# Patient Record
Sex: Male | Born: 1970
Health system: Southern US, Community
[De-identification: ages and names within clinical notes are randomized; demographics above are authoritative.]

## PROBLEM LIST (undated history)

## (undated) DIAGNOSIS — R142 Eructation: Secondary | ICD-10-CM

## (undated) DIAGNOSIS — G629 Polyneuropathy, unspecified: Secondary | ICD-10-CM

## (undated) DIAGNOSIS — R197 Diarrhea, unspecified: Secondary | ICD-10-CM

## (undated) DIAGNOSIS — F329 Major depressive disorder, single episode, unspecified: Secondary | ICD-10-CM

## (undated) DIAGNOSIS — E7800 Pure hypercholesterolemia, unspecified: Secondary | ICD-10-CM

## (undated) DIAGNOSIS — K811 Chronic cholecystitis: Secondary | ICD-10-CM

## (undated) DIAGNOSIS — Z21 Asymptomatic human immunodeficiency virus [HIV] infection status: Secondary | ICD-10-CM

## (undated) DIAGNOSIS — B1910 Unspecified viral hepatitis B without hepatic coma: Secondary | ICD-10-CM

## (undated) DIAGNOSIS — J189 Pneumonia, unspecified organism: Secondary | ICD-10-CM

## (undated) DIAGNOSIS — R569 Unspecified convulsions: Secondary | ICD-10-CM

## (undated) DIAGNOSIS — K219 Gastro-esophageal reflux disease without esophagitis: Secondary | ICD-10-CM

## (undated) DIAGNOSIS — F32A Depression, unspecified: Secondary | ICD-10-CM

## (undated) DIAGNOSIS — B20 Human immunodeficiency virus [HIV] disease: Secondary | ICD-10-CM

## (undated) DIAGNOSIS — G709 Myoneural disorder, unspecified: Secondary | ICD-10-CM

## (undated) DIAGNOSIS — J302 Other seasonal allergic rhinitis: Secondary | ICD-10-CM

## (undated) DIAGNOSIS — B159 Hepatitis A without hepatic coma: Secondary | ICD-10-CM

## (undated) DIAGNOSIS — E119 Type 2 diabetes mellitus without complications: Secondary | ICD-10-CM

## (undated) DIAGNOSIS — F419 Anxiety disorder, unspecified: Secondary | ICD-10-CM

## (undated) HISTORY — PX: JOINT REPLACEMENT: SHX530

## (undated) HISTORY — DX: Eructation: R14.2

## (undated) HISTORY — DX: Pneumonia, unspecified organism: J18.9

## (undated) HISTORY — DX: Unspecified convulsions: R56.9

## (undated) HISTORY — DX: Diarrhea, unspecified: R19.7

## (undated) HISTORY — DX: Myoneural disorder, unspecified: G70.9

## (undated) HISTORY — DX: Anxiety disorder, unspecified: F41.9

## (undated) HISTORY — PX: TOTAL HIP ARTHROPLASTY: SHX124

---
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
## 1997-07-09 ENCOUNTER — Encounter (INDEPENDENT_AMBULATORY_CARE_PROVIDER_SITE_OTHER): Payer: Self-pay | Admitting: *Deleted

## 1997-07-09 LAB — CONVERTED CEMR LAB
CD4 Count: 420 microliters
CD4 T Cell Abs: 420

## 2000-04-20 ENCOUNTER — Encounter: Admission: RE | Admit: 2000-04-20 | Discharge: 2000-04-20 | Payer: Self-pay | Admitting: Infectious Diseases

## 2000-04-20 ENCOUNTER — Ambulatory Visit (HOSPITAL_COMMUNITY): Admission: RE | Admit: 2000-04-20 | Discharge: 2000-04-20 | Payer: Self-pay | Admitting: Infectious Diseases

## 2000-05-03 ENCOUNTER — Encounter: Admission: RE | Admit: 2000-05-03 | Discharge: 2000-05-03 | Payer: Self-pay | Admitting: Internal Medicine

## 2000-06-13 ENCOUNTER — Encounter: Admission: RE | Admit: 2000-06-13 | Discharge: 2000-06-13 | Payer: Self-pay | Admitting: Internal Medicine

## 2000-10-18 ENCOUNTER — Encounter: Admission: RE | Admit: 2000-10-18 | Discharge: 2000-10-18 | Payer: Self-pay | Admitting: Internal Medicine

## 2000-10-18 ENCOUNTER — Ambulatory Visit (HOSPITAL_COMMUNITY): Admission: RE | Admit: 2000-10-18 | Discharge: 2000-10-18 | Payer: Self-pay | Admitting: Internal Medicine

## 2001-04-25 ENCOUNTER — Encounter: Admission: RE | Admit: 2001-04-25 | Discharge: 2001-04-25 | Payer: Self-pay | Admitting: Infectious Diseases

## 2001-09-19 ENCOUNTER — Ambulatory Visit (HOSPITAL_COMMUNITY): Admission: RE | Admit: 2001-09-19 | Discharge: 2001-09-19 | Payer: Self-pay | Admitting: Internal Medicine

## 2001-09-19 ENCOUNTER — Encounter: Admission: RE | Admit: 2001-09-19 | Discharge: 2001-09-19 | Payer: Self-pay | Admitting: Internal Medicine

## 2001-11-14 ENCOUNTER — Encounter: Admission: RE | Admit: 2001-11-14 | Discharge: 2001-11-14 | Payer: Self-pay | Admitting: Internal Medicine

## 2002-01-30 ENCOUNTER — Encounter: Admission: RE | Admit: 2002-01-30 | Discharge: 2002-01-30 | Payer: Self-pay | Admitting: Internal Medicine

## 2002-01-30 ENCOUNTER — Ambulatory Visit (HOSPITAL_COMMUNITY): Admission: RE | Admit: 2002-01-30 | Discharge: 2002-01-30 | Payer: Self-pay | Admitting: Internal Medicine

## 2002-02-13 ENCOUNTER — Encounter: Admission: RE | Admit: 2002-02-13 | Discharge: 2002-02-13 | Payer: Self-pay | Admitting: Internal Medicine

## 2002-05-30 ENCOUNTER — Ambulatory Visit (HOSPITAL_COMMUNITY): Admission: RE | Admit: 2002-05-30 | Discharge: 2002-05-30 | Payer: Self-pay | Admitting: Internal Medicine

## 2002-05-30 ENCOUNTER — Encounter: Admission: RE | Admit: 2002-05-30 | Discharge: 2002-05-30 | Payer: Self-pay | Admitting: Internal Medicine

## 2002-06-14 ENCOUNTER — Encounter: Admission: RE | Admit: 2002-06-14 | Discharge: 2002-06-14 | Payer: Self-pay | Admitting: Internal Medicine

## 2002-09-20 ENCOUNTER — Ambulatory Visit (HOSPITAL_COMMUNITY): Admission: RE | Admit: 2002-09-20 | Discharge: 2002-09-20 | Payer: Self-pay | Admitting: Internal Medicine

## 2002-09-20 ENCOUNTER — Encounter: Admission: RE | Admit: 2002-09-20 | Discharge: 2002-09-20 | Payer: Self-pay | Admitting: Internal Medicine

## 2002-10-23 ENCOUNTER — Encounter: Admission: RE | Admit: 2002-10-23 | Discharge: 2002-10-23 | Payer: Self-pay | Admitting: Internal Medicine

## 2003-01-10 ENCOUNTER — Encounter: Admission: RE | Admit: 2003-01-10 | Discharge: 2003-01-10 | Payer: Self-pay | Admitting: Internal Medicine

## 2003-03-05 ENCOUNTER — Encounter: Admission: RE | Admit: 2003-03-05 | Discharge: 2003-03-05 | Payer: Self-pay | Admitting: Internal Medicine

## 2004-03-12 ENCOUNTER — Encounter: Admission: RE | Admit: 2004-03-12 | Discharge: 2004-03-12 | Payer: Self-pay | Admitting: Internal Medicine

## 2004-03-12 ENCOUNTER — Ambulatory Visit (HOSPITAL_COMMUNITY): Admission: RE | Admit: 2004-03-12 | Discharge: 2004-03-12 | Payer: Self-pay | Admitting: Internal Medicine

## 2004-04-16 ENCOUNTER — Encounter: Admission: RE | Admit: 2004-04-16 | Discharge: 2004-04-16 | Payer: Self-pay | Admitting: Internal Medicine

## 2004-10-08 ENCOUNTER — Ambulatory Visit (HOSPITAL_COMMUNITY): Admission: RE | Admit: 2004-10-08 | Discharge: 2004-10-08 | Payer: Self-pay | Admitting: Internal Medicine

## 2004-10-13 ENCOUNTER — Ambulatory Visit: Payer: Self-pay | Admitting: Infectious Diseases

## 2004-10-22 ENCOUNTER — Ambulatory Visit (HOSPITAL_COMMUNITY): Admission: RE | Admit: 2004-10-22 | Discharge: 2004-10-22 | Payer: Self-pay | Admitting: Infectious Diseases

## 2004-10-27 ENCOUNTER — Ambulatory Visit: Payer: Self-pay | Admitting: Infectious Diseases

## 2004-10-27 ENCOUNTER — Ambulatory Visit (HOSPITAL_COMMUNITY): Admission: RE | Admit: 2004-10-27 | Discharge: 2004-10-27 | Payer: Self-pay | Admitting: Infectious Diseases

## 2004-11-12 ENCOUNTER — Ambulatory Visit: Payer: Self-pay | Admitting: Internal Medicine

## 2004-12-08 ENCOUNTER — Ambulatory Visit: Payer: Self-pay | Admitting: Internal Medicine

## 2004-12-21 ENCOUNTER — Ambulatory Visit (HOSPITAL_COMMUNITY): Admission: RE | Admit: 2004-12-21 | Discharge: 2004-12-21 | Payer: Self-pay | Admitting: Infectious Diseases

## 2004-12-21 ENCOUNTER — Ambulatory Visit: Payer: Self-pay | Admitting: Infectious Diseases

## 2005-01-19 ENCOUNTER — Ambulatory Visit: Payer: Self-pay | Admitting: Internal Medicine

## 2005-02-03 ENCOUNTER — Ambulatory Visit: Payer: Self-pay | Admitting: Internal Medicine

## 2005-02-03 ENCOUNTER — Inpatient Hospital Stay (HOSPITAL_COMMUNITY): Admission: RE | Admit: 2005-02-03 | Discharge: 2005-02-07 | Payer: Self-pay | Admitting: Orthopedic Surgery

## 2005-04-15 ENCOUNTER — Encounter (INDEPENDENT_AMBULATORY_CARE_PROVIDER_SITE_OTHER): Payer: Self-pay | Admitting: *Deleted

## 2005-04-15 ENCOUNTER — Ambulatory Visit (HOSPITAL_COMMUNITY): Admission: RE | Admit: 2005-04-15 | Discharge: 2005-04-15 | Payer: Self-pay | Admitting: Infectious Diseases

## 2005-04-15 ENCOUNTER — Ambulatory Visit: Payer: Self-pay | Admitting: Infectious Diseases

## 2005-04-15 LAB — CONVERTED CEMR LAB
CD4 Count: 500 microliters
HIV 1 RNA Quant: 399 copies/mL

## 2005-05-04 ENCOUNTER — Ambulatory Visit: Payer: Self-pay | Admitting: Internal Medicine

## 2005-06-14 ENCOUNTER — Emergency Department (HOSPITAL_COMMUNITY): Admission: EM | Admit: 2005-06-14 | Discharge: 2005-06-15 | Payer: Self-pay | Admitting: Emergency Medicine

## 2005-08-09 ENCOUNTER — Inpatient Hospital Stay (HOSPITAL_COMMUNITY): Admission: RE | Admit: 2005-08-09 | Discharge: 2005-08-13 | Payer: Self-pay | Admitting: Orthopedic Surgery

## 2005-09-21 ENCOUNTER — Ambulatory Visit: Payer: Self-pay | Admitting: Internal Medicine

## 2005-09-21 ENCOUNTER — Ambulatory Visit (HOSPITAL_COMMUNITY): Admission: RE | Admit: 2005-09-21 | Discharge: 2005-09-21 | Payer: Self-pay | Admitting: Internal Medicine

## 2005-10-05 ENCOUNTER — Ambulatory Visit: Payer: Self-pay | Admitting: Internal Medicine

## 2006-03-29 IMAGING — CR DG CHEST 2V
2 series · 2 of 2 positions shown · non-contrast
Comparison: None.

CLINICAL DATA: Hip pain.  No chest complaints. 
 2-VIEW CHEST:

[view not recorded (1 of 2)]
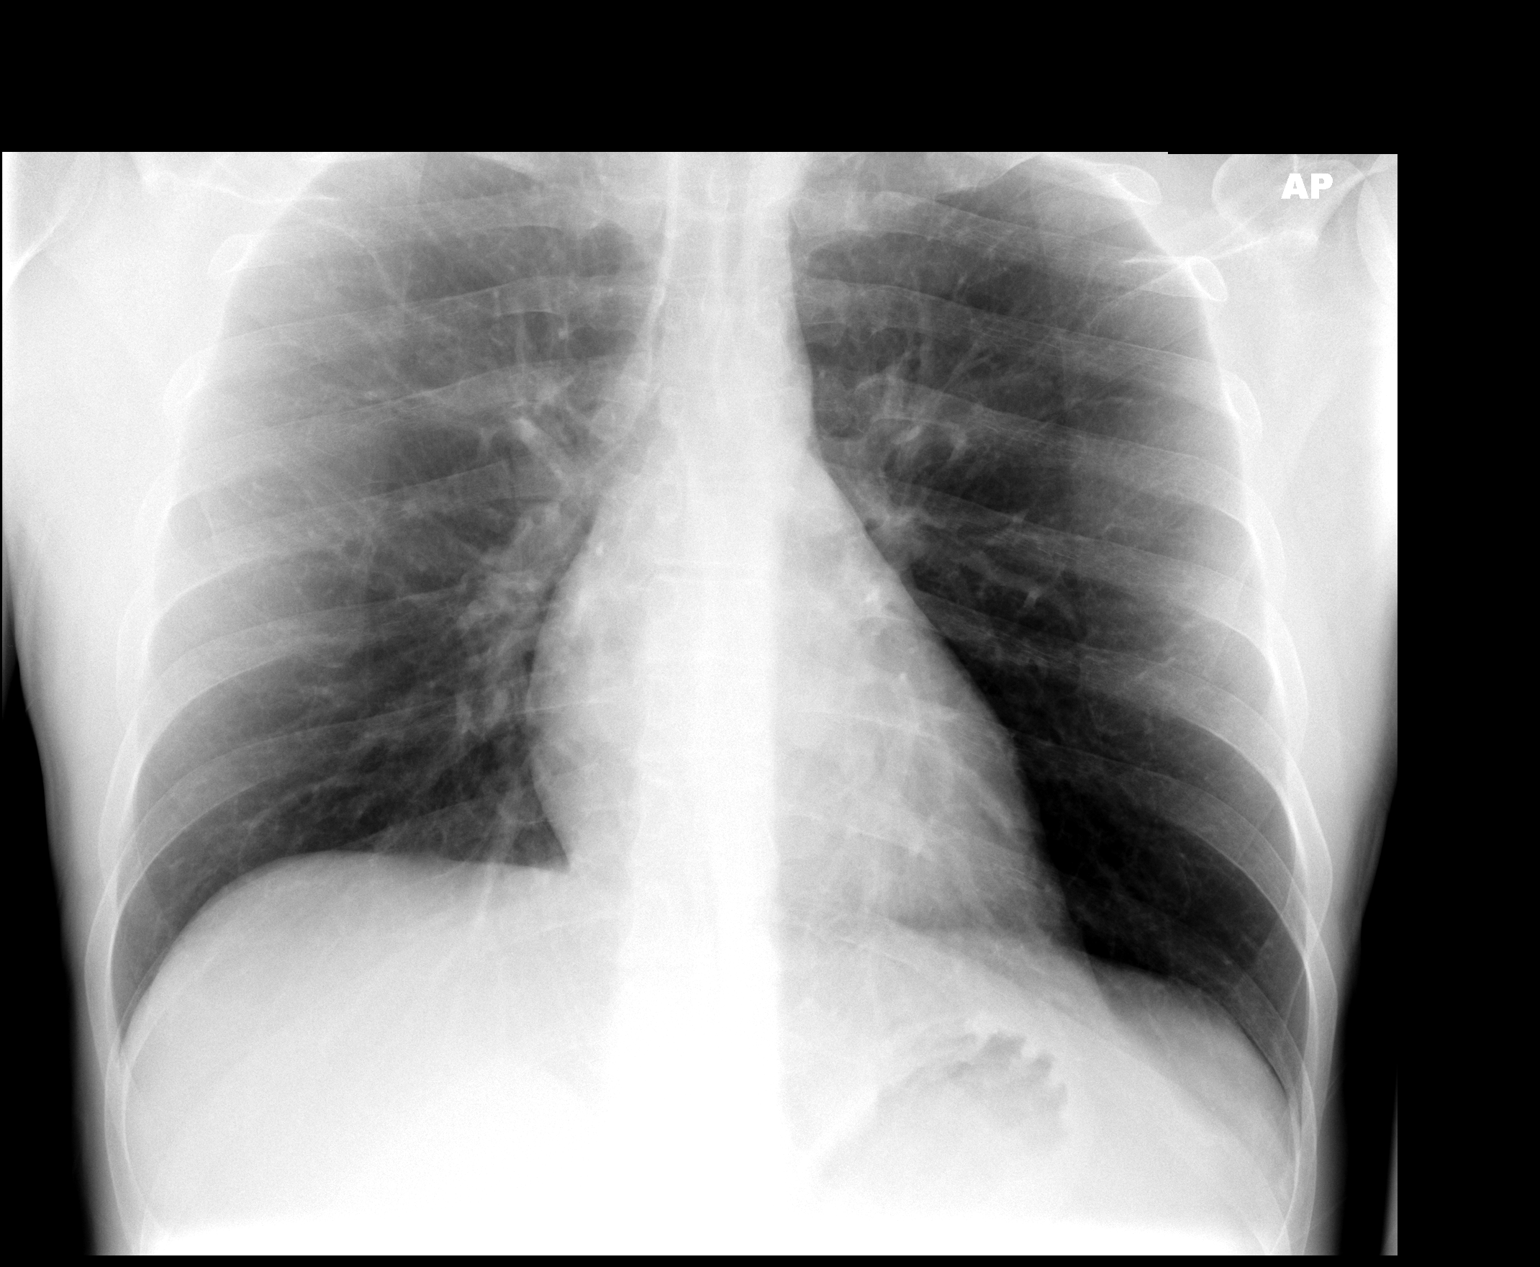

[view not recorded (2 of 2)]
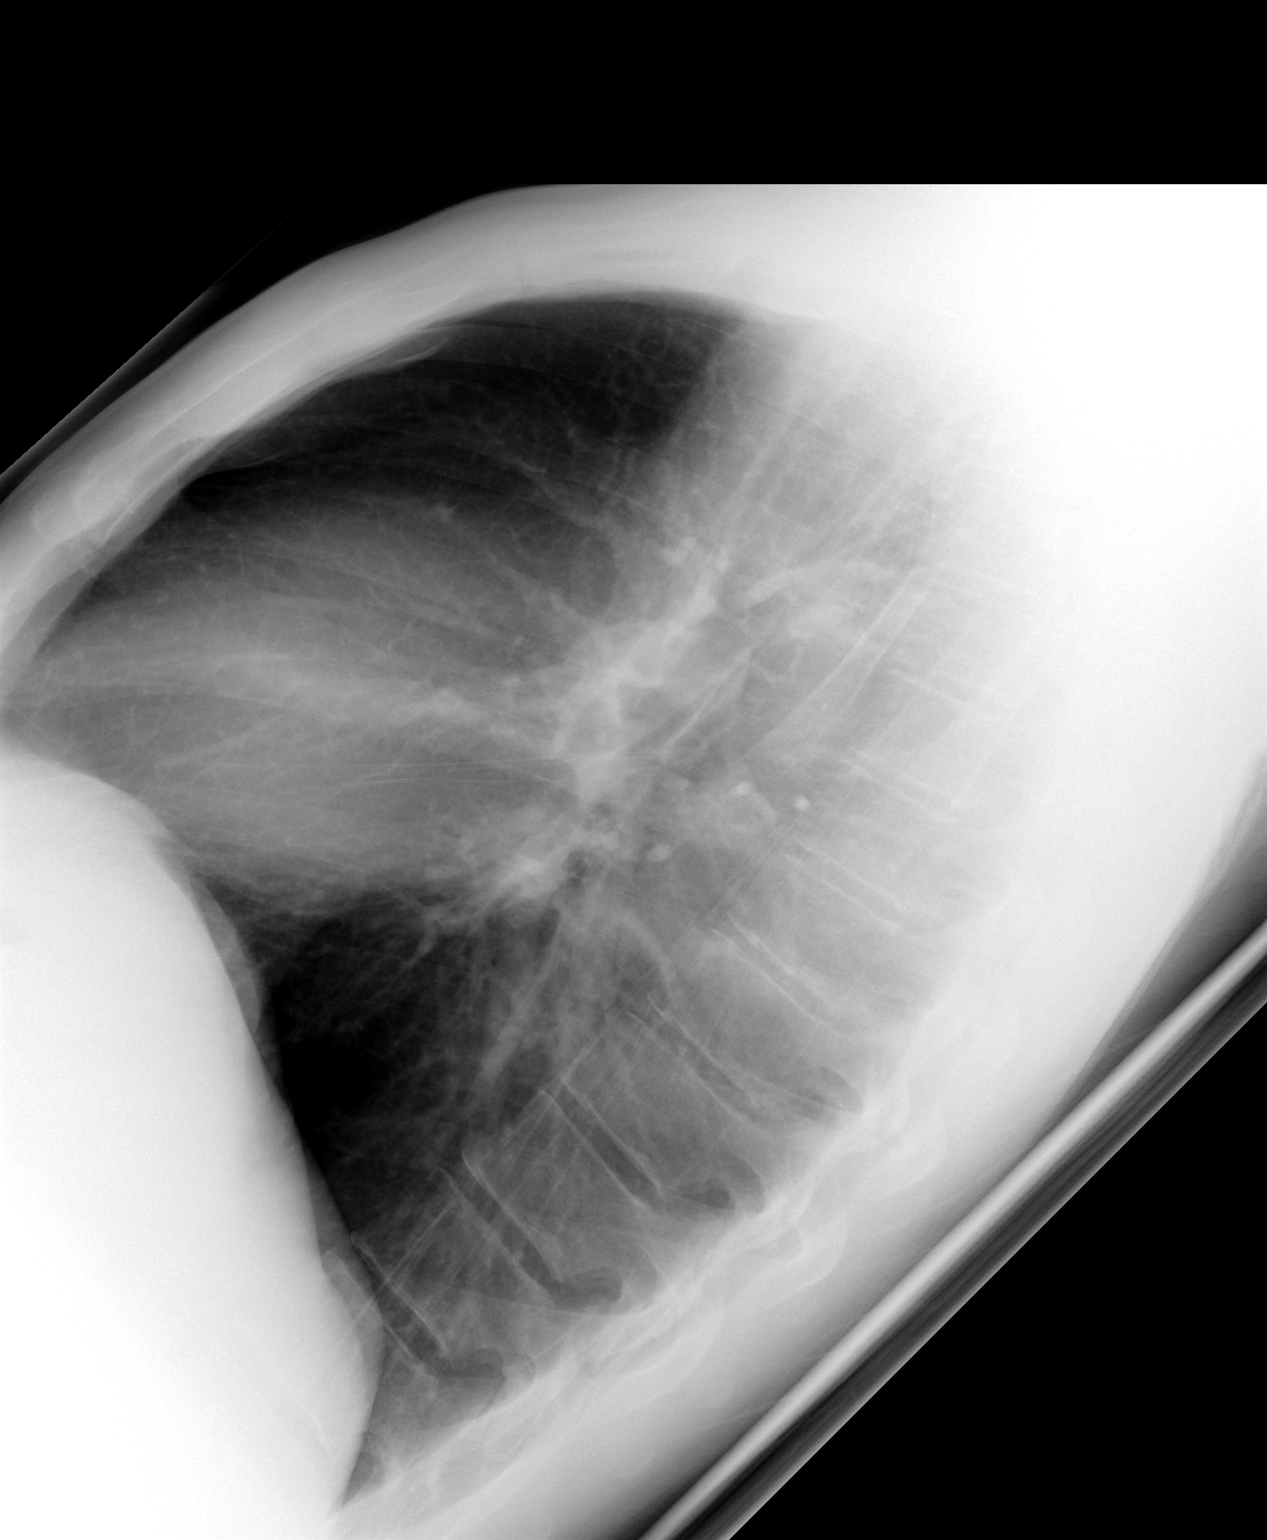

[2 of 2 positions shown; findings below may reference images not displayed]

FINDINGS: The heart size is normal.  No pleural effusions or pneumothorax.  Mild bronchitic changes are noted within both lungs.
IMPRESSION: No active cardiopulmonary abnormalities.

## 2006-05-26 ENCOUNTER — Encounter (INDEPENDENT_AMBULATORY_CARE_PROVIDER_SITE_OTHER): Payer: Self-pay | Admitting: *Deleted

## 2006-05-26 ENCOUNTER — Encounter: Admission: RE | Admit: 2006-05-26 | Discharge: 2006-05-26 | Payer: Self-pay | Admitting: Internal Medicine

## 2006-05-26 ENCOUNTER — Ambulatory Visit: Payer: Self-pay | Admitting: Internal Medicine

## 2006-05-26 LAB — CONVERTED CEMR LAB
CD4 Count: 530 microliters
HIV 1 RNA Quant: 399 copies/mL

## 2006-06-09 ENCOUNTER — Ambulatory Visit: Payer: Self-pay | Admitting: Internal Medicine

## 2006-07-19 ENCOUNTER — Ambulatory Visit: Payer: Self-pay | Admitting: Internal Medicine

## 2006-07-19 ENCOUNTER — Encounter (INDEPENDENT_AMBULATORY_CARE_PROVIDER_SITE_OTHER): Payer: Self-pay | Admitting: *Deleted

## 2006-07-19 ENCOUNTER — Encounter: Admission: RE | Admit: 2006-07-19 | Discharge: 2006-07-19 | Payer: Self-pay | Admitting: Internal Medicine

## 2006-07-19 LAB — CONVERTED CEMR LAB
CD4 Count: 730 microliters
HIV 1 RNA Quant: 93 copies/mL

## 2006-08-03 ENCOUNTER — Ambulatory Visit: Payer: Self-pay | Admitting: Internal Medicine

## 2007-01-02 ENCOUNTER — Encounter (INDEPENDENT_AMBULATORY_CARE_PROVIDER_SITE_OTHER): Payer: Self-pay | Admitting: *Deleted

## 2007-01-02 LAB — CONVERTED CEMR LAB

## 2007-01-15 ENCOUNTER — Encounter (INDEPENDENT_AMBULATORY_CARE_PROVIDER_SITE_OTHER): Payer: Self-pay | Admitting: *Deleted

## 2007-05-10 ENCOUNTER — Ambulatory Visit: Payer: Self-pay | Admitting: Internal Medicine

## 2007-05-10 ENCOUNTER — Encounter: Admission: RE | Admit: 2007-05-10 | Discharge: 2007-05-10 | Payer: Self-pay | Admitting: Internal Medicine

## 2007-05-10 ENCOUNTER — Encounter (INDEPENDENT_AMBULATORY_CARE_PROVIDER_SITE_OTHER): Payer: Self-pay | Admitting: *Deleted

## 2007-05-10 LAB — CONVERTED CEMR LAB
ALT: 101 units/L — ABNORMAL HIGH (ref 0–53)
AST: 105 units/L — ABNORMAL HIGH (ref 0–37)
Albumin: 5 g/dL (ref 3.5–5.2)
Alkaline Phosphatase: 106 units/L (ref 39–117)
BUN: 18 mg/dL (ref 6–23)
Basophils Absolute: 0 10*3/uL (ref 0.0–0.1)
Basophils Relative: 0 % (ref 0–1)
CO2: 20 meq/L (ref 19–32)
Calcium: 9.4 mg/dL (ref 8.4–10.5)
Chloride: 105 meq/L (ref 96–112)
Cholesterol: 233 mg/dL — ABNORMAL HIGH (ref 0–200)
Creatinine, Ser: 0.94 mg/dL (ref 0.40–1.50)
Eosinophils Absolute: 0.3 10*3/uL (ref 0.0–0.7)
Eosinophils Relative: 4 % (ref 0–5)
Glucose, Bld: 104 mg/dL — ABNORMAL HIGH (ref 70–99)
HCT: 51.7 % (ref 39.0–52.0)
HDL: 47 mg/dL (ref >39)
HIV 1 RNA Quant: 130 copies/mL — ABNORMAL HIGH (ref <50)
HIV-1 RNA Quant, Log: 2.11 — ABNORMAL HIGH (ref <1.70)
Hemoglobin: 17.6 g/dL — ABNORMAL HIGH (ref 13.0–17.0)
LDL Cholesterol: 140 mg/dL — ABNORMAL HIGH (ref 0–99)
Lymphocytes Relative: 36 % (ref 12–46)
Lymphs Abs: 2.6 10*3/uL (ref 0.7–3.3)
MCHC: 34 g/dL (ref 30.0–36.0)
MCV: 93.3 fL (ref 78.0–100.0)
Monocytes Absolute: 0.8 10*3/uL — ABNORMAL HIGH (ref 0.2–0.7)
Monocytes Relative: 11 % (ref 3–11)
Neutro Abs: 3.5 10*3/uL (ref 1.7–7.7)
Neutrophils Relative %: 49 % (ref 43–77)
Platelets: 268 10*3/uL (ref 150–400)
Potassium: 4.3 meq/L (ref 3.5–5.3)
RBC: 5.54 M/uL (ref 4.22–5.81)
RDW: 13.3 % (ref 11.5–14.0)
Sodium: 139 meq/L (ref 135–145)
Total Bilirubin: 0.6 mg/dL (ref 0.3–1.2)
Total CHOL/HDL Ratio: 5
Total Protein: 7.3 g/dL (ref 6.0–8.3)
Triglycerides: 230 mg/dL — ABNORMAL HIGH (ref <150)
VLDL: 46 mg/dL — ABNORMAL HIGH (ref 0–40)
WBC: 7.2 10*3/uL (ref 4.0–10.5)

## 2007-05-11 ENCOUNTER — Encounter: Payer: Self-pay | Admitting: Internal Medicine

## 2007-05-11 LAB — CONVERTED CEMR LAB: CD4 Count: 660 microliters

## 2007-06-01 DIAGNOSIS — F418 Other specified anxiety disorders: Secondary | ICD-10-CM | POA: Insufficient documentation

## 2007-06-01 DIAGNOSIS — L0293 Carbuncle, unspecified: Secondary | ICD-10-CM

## 2007-06-01 DIAGNOSIS — L0292 Furuncle, unspecified: Secondary | ICD-10-CM | POA: Insufficient documentation

## 2007-06-01 DIAGNOSIS — J309 Allergic rhinitis, unspecified: Secondary | ICD-10-CM | POA: Insufficient documentation

## 2007-06-01 DIAGNOSIS — M87059 Idiopathic aseptic necrosis of unspecified femur: Secondary | ICD-10-CM | POA: Insufficient documentation

## 2007-06-01 DIAGNOSIS — F419 Anxiety disorder, unspecified: Secondary | ICD-10-CM

## 2007-06-01 DIAGNOSIS — B20 Human immunodeficiency virus [HIV] disease: Secondary | ICD-10-CM

## 2007-06-01 DIAGNOSIS — L408 Other psoriasis: Secondary | ICD-10-CM

## 2007-06-01 DIAGNOSIS — F411 Generalized anxiety disorder: Secondary | ICD-10-CM

## 2007-06-01 DIAGNOSIS — R569 Unspecified convulsions: Secondary | ICD-10-CM

## 2007-06-01 DIAGNOSIS — Z8619 Personal history of other infectious and parasitic diseases: Secondary | ICD-10-CM

## 2007-06-05 ENCOUNTER — Ambulatory Visit: Payer: Self-pay | Admitting: Internal Medicine

## 2007-06-05 DIAGNOSIS — K219 Gastro-esophageal reflux disease without esophagitis: Secondary | ICD-10-CM

## 2007-06-16 ENCOUNTER — Encounter: Payer: Self-pay | Admitting: Internal Medicine

## 2007-06-16 ENCOUNTER — Encounter: Admission: RE | Admit: 2007-06-16 | Discharge: 2007-06-16 | Payer: Self-pay | Admitting: Unknown Physician Specialty

## 2007-07-12 ENCOUNTER — Encounter: Admission: RE | Admit: 2007-07-12 | Discharge: 2007-07-12 | Payer: Self-pay | Admitting: Unknown Physician Specialty

## 2007-07-29 ENCOUNTER — Encounter: Payer: Self-pay | Admitting: Internal Medicine

## 2007-08-18 ENCOUNTER — Ambulatory Visit: Payer: Self-pay | Admitting: Internal Medicine

## 2007-11-07 ENCOUNTER — Encounter: Payer: Self-pay | Admitting: Internal Medicine

## 2007-11-14 ENCOUNTER — Ambulatory Visit: Payer: Self-pay | Admitting: Internal Medicine

## 2007-11-14 ENCOUNTER — Encounter: Admission: RE | Admit: 2007-11-14 | Discharge: 2007-11-14 | Payer: Self-pay | Admitting: Internal Medicine

## 2007-11-14 LAB — CONVERTED CEMR LAB
ALT: 97 units/L — ABNORMAL HIGH (ref 0–53)
AST: 60 units/L — ABNORMAL HIGH (ref 0–37)
Albumin: 4.7 g/dL (ref 3.5–5.2)
Alkaline Phosphatase: 89 units/L (ref 39–117)
BUN: 19 mg/dL (ref 6–23)
CO2: 24 meq/L (ref 19–32)
Calcium: 9.9 mg/dL (ref 8.4–10.5)
Chloride: 104 meq/L (ref 96–112)
Cholesterol: 234 mg/dL — ABNORMAL HIGH (ref 0–200)
Creatinine, Ser: 0.92 mg/dL (ref 0.40–1.50)
Glucose, Bld: 89 mg/dL (ref 70–99)
HCT: 48.5 % (ref 39.0–52.0)
HCV Ab: NEGATIVE
HDL: 42 mg/dL (ref >39)
HIV 1 RNA Quant: 169 copies/mL — ABNORMAL HIGH (ref <50)
HIV-1 RNA Quant, Log: 2.23 — ABNORMAL HIGH (ref <1.70)
Hemoglobin: 17 g/dL (ref 13.0–17.0)
MCHC: 35.1 g/dL (ref 30.0–36.0)
MCV: 90.3 fL (ref 78.0–100.0)
Platelets: 291 10*3/uL (ref 150–400)
Potassium: 3.9 meq/L (ref 3.5–5.3)
RBC: 5.37 M/uL (ref 4.22–5.81)
RDW: 12.8 % (ref 11.5–15.5)
Sodium: 141 meq/L (ref 135–145)
Total Bilirubin: 0.5 mg/dL (ref 0.3–1.2)
Total CHOL/HDL Ratio: 5.6
Total Protein: 7.1 g/dL (ref 6.0–8.3)
Triglycerides: 402 mg/dL — ABNORMAL HIGH (ref <150)
WBC: 8.5 10*3/uL (ref 4.0–10.5)

## 2007-12-05 ENCOUNTER — Ambulatory Visit: Payer: Self-pay | Admitting: Internal Medicine

## 2008-04-15 ENCOUNTER — Emergency Department (HOSPITAL_COMMUNITY): Admission: EM | Admit: 2008-04-15 | Discharge: 2008-04-15 | Payer: Self-pay | Admitting: Emergency Medicine

## 2008-07-03 ENCOUNTER — Encounter: Payer: Self-pay | Admitting: Internal Medicine

## 2008-07-30 ENCOUNTER — Ambulatory Visit: Payer: Self-pay | Admitting: Internal Medicine

## 2008-07-30 LAB — CONVERTED CEMR LAB
ALT: 88 units/L — ABNORMAL HIGH (ref 0–53)
AST: 52 units/L — ABNORMAL HIGH (ref 0–37)
Albumin: 4.3 g/dL (ref 3.5–5.2)
Alkaline Phosphatase: 95 units/L (ref 39–117)
BUN: 12 mg/dL (ref 6–23)
Basophils Absolute: 0 10*3/uL (ref 0.0–0.1)
Basophils Relative: 0 % (ref 0–1)
CO2: 20 meq/L (ref 19–32)
Calcium: 8.5 mg/dL (ref 8.4–10.5)
Chloride: 106 meq/L (ref 96–112)
Cholesterol: 231 mg/dL — ABNORMAL HIGH (ref 0–200)
Creatinine, Ser: 0.97 mg/dL (ref 0.40–1.50)
Eosinophils Absolute: 0.4 10*3/uL (ref 0.0–0.7)
Eosinophils Relative: 6 % — ABNORMAL HIGH (ref 0–5)
Glucose, Bld: 146 mg/dL — ABNORMAL HIGH (ref 70–99)
HCT: 47.4 % (ref 39.0–52.0)
HDL: 35 mg/dL — ABNORMAL LOW (ref >39)
HIV 1 RNA Quant: 370 copies/mL — ABNORMAL HIGH (ref <50)
HIV-1 RNA Quant, Log: 2.57 — ABNORMAL HIGH (ref <1.70)
Hemoglobin: 17 g/dL (ref 13.0–17.0)
Lymphocytes Relative: 38 % (ref 12–46)
Lymphs Abs: 2.8 10*3/uL (ref 0.7–4.0)
MCHC: 35.9 g/dL (ref 30.0–36.0)
MCV: 89.9 fL (ref 78.0–100.0)
Monocytes Absolute: 0.8 10*3/uL (ref 0.1–1.0)
Monocytes Relative: 10 % (ref 3–12)
Neutro Abs: 3.4 10*3/uL (ref 1.7–7.7)
Neutrophils Relative %: 46 % (ref 43–77)
Platelets: 240 10*3/uL (ref 150–400)
Potassium: 3.9 meq/L (ref 3.5–5.3)
RBC: 5.27 M/uL (ref 4.22–5.81)
RDW: 13.3 % (ref 11.5–15.5)
Sodium: 140 meq/L (ref 135–145)
Total Bilirubin: 0.4 mg/dL (ref 0.3–1.2)
Total CHOL/HDL Ratio: 6.6
Total Protein: 6.8 g/dL (ref 6.0–8.3)
Triglycerides: 498 mg/dL — ABNORMAL HIGH (ref <150)
WBC: 7.4 10*3/uL (ref 4.0–10.5)

## 2008-08-20 ENCOUNTER — Ambulatory Visit: Payer: Self-pay | Admitting: Internal Medicine

## 2008-08-20 DIAGNOSIS — E781 Pure hyperglyceridemia: Secondary | ICD-10-CM | POA: Insufficient documentation

## 2008-08-20 DIAGNOSIS — A630 Anogenital (venereal) warts: Secondary | ICD-10-CM

## 2008-08-20 DIAGNOSIS — E785 Hyperlipidemia, unspecified: Secondary | ICD-10-CM

## 2008-10-17 ENCOUNTER — Encounter (INDEPENDENT_AMBULATORY_CARE_PROVIDER_SITE_OTHER): Payer: Self-pay | Admitting: *Deleted

## 2008-10-17 ENCOUNTER — Ambulatory Visit: Payer: Self-pay | Admitting: Internal Medicine

## 2008-10-17 DIAGNOSIS — M545 Low back pain: Secondary | ICD-10-CM

## 2009-02-18 ENCOUNTER — Ambulatory Visit: Payer: Self-pay | Admitting: Internal Medicine

## 2009-02-18 LAB — CONVERTED CEMR LAB
ALT: 70 units/L — ABNORMAL HIGH (ref 0–53)
AST: 45 units/L — ABNORMAL HIGH (ref 0–37)
Albumin: 4.8 g/dL (ref 3.5–5.2)
Alkaline Phosphatase: 111 units/L (ref 39–117)
BUN: 17 mg/dL (ref 6–23)
Basophils Absolute: 0 10*3/uL (ref 0.0–0.1)
Basophils Relative: 0 % (ref 0–1)
CO2: 22 meq/L (ref 19–32)
Calcium: 9.7 mg/dL (ref 8.4–10.5)
Chloride: 104 meq/L (ref 96–112)
Cholesterol: 222 mg/dL — ABNORMAL HIGH (ref 0–200)
Creatinine, Ser: 0.98 mg/dL (ref 0.40–1.50)
Eosinophils Absolute: 0.2 10*3/uL (ref 0.0–0.7)
Eosinophils Relative: 2 % (ref 0–5)
GFR calc Af Amer: >60 mL/min (ref >60)
GFR calc non Af Amer: >60 mL/min (ref >60)
Glucose, Bld: 113 mg/dL — ABNORMAL HIGH (ref 70–99)
HCT: 49.3 % (ref 39.0–52.0)
HDL: 41 mg/dL (ref >39)
Hemoglobin: 17.7 g/dL — ABNORMAL HIGH (ref 13.0–17.0)
Lymphocytes Relative: 40 % (ref 12–46)
Lymphs Abs: 4 10*3/uL (ref 0.7–4.0)
MCHC: 35.9 g/dL (ref 30.0–36.0)
MCV: 88.5 fL (ref 78.0–100.0)
Monocytes Absolute: 0.8 10*3/uL (ref 0.1–1.0)
Monocytes Relative: 9 % (ref 3–12)
Neutro Abs: 4.8 10*3/uL (ref 1.7–7.7)
Neutrophils Relative %: 49 % (ref 43–77)
Platelets: 250 10*3/uL (ref 150–400)
Potassium: 4 meq/L (ref 3.5–5.3)
RBC: 5.57 M/uL (ref 4.22–5.81)
RDW: 13.1 % (ref 11.5–15.5)
Sodium: 141 meq/L (ref 135–145)
Total Bilirubin: 0.6 mg/dL (ref 0.3–1.2)
Total CHOL/HDL Ratio: 5.4
Total Protein: 7.4 g/dL (ref 6.0–8.3)
Triglycerides: 595 mg/dL — ABNORMAL HIGH (ref <150)
WBC: 9.9 10*3/uL (ref 4.0–10.5)

## 2009-03-04 ENCOUNTER — Ambulatory Visit: Payer: Self-pay | Admitting: Internal Medicine

## 2009-05-08 ENCOUNTER — Ambulatory Visit: Payer: Self-pay | Admitting: Internal Medicine

## 2009-06-03 ENCOUNTER — Encounter (INDEPENDENT_AMBULATORY_CARE_PROVIDER_SITE_OTHER): Payer: Self-pay | Admitting: *Deleted

## 2009-08-07 ENCOUNTER — Ambulatory Visit: Payer: Self-pay | Admitting: Internal Medicine

## 2009-08-07 LAB — CONVERTED CEMR LAB: HIV-1 RNA Quant, Log: 2.45 — ABNORMAL HIGH (ref <1.68)

## 2009-08-19 ENCOUNTER — Ambulatory Visit: Payer: Self-pay | Admitting: Internal Medicine

## 2009-08-22 ENCOUNTER — Ambulatory Visit: Payer: Self-pay | Admitting: Internal Medicine

## 2009-08-22 ENCOUNTER — Telehealth: Payer: Self-pay

## 2009-08-22 LAB — CONVERTED CEMR LAB
HDL: 38 mg/dL — ABNORMAL LOW (ref >39)
Total CHOL/HDL Ratio: 5.8
Triglycerides: 620 mg/dL — ABNORMAL HIGH (ref <150)

## 2009-09-18 ENCOUNTER — Ambulatory Visit: Payer: Self-pay | Admitting: Internal Medicine

## 2009-09-18 DIAGNOSIS — H9190 Unspecified hearing loss, unspecified ear: Secondary | ICD-10-CM | POA: Insufficient documentation

## 2009-10-10 ENCOUNTER — Encounter: Payer: Self-pay | Admitting: Internal Medicine

## 2009-10-14 ENCOUNTER — Ambulatory Visit (HOSPITAL_COMMUNITY): Admission: RE | Admit: 2009-10-14 | Discharge: 2009-10-14 | Payer: Self-pay | Admitting: Internal Medicine

## 2009-10-28 ENCOUNTER — Encounter: Payer: Self-pay | Admitting: Internal Medicine

## 2009-11-17 ENCOUNTER — Ambulatory Visit: Payer: Self-pay | Admitting: Internal Medicine

## 2009-11-17 LAB — CONVERTED CEMR LAB
ALT: 43 units/L (ref 0–53)
AST: 40 units/L — ABNORMAL HIGH (ref 0–37)
Alkaline Phosphatase: 115 units/L (ref 39–117)
BUN: 12 mg/dL (ref 6–23)
Basophils Absolute: 0 10*3/uL (ref 0.0–0.1)
Basophils Relative: 0 % (ref 0–1)
Chloride: 103 meq/L (ref 96–112)
Creatinine, Ser: 0.91 mg/dL (ref 0.40–1.50)
Eosinophils Absolute: 0.2 10*3/uL (ref 0.0–0.7)
HDL: 42 mg/dL (ref >39)
HIV 1 RNA Quant: 64 copies/mL — ABNORMAL HIGH (ref <48)
LDL Cholesterol: 137 mg/dL — ABNORMAL HIGH (ref 0–99)
Lymphs Abs: 2.1 10*3/uL (ref 0.7–4.0)
MCV: 89.3 fL (ref >=78.0)
Neutrophils Relative %: 45 % (ref 43–77)
Platelets: 277 10*3/uL (ref 150–400)
RDW: 12.4 % (ref 11.5–15.5)
Total Bilirubin: 0.6 mg/dL (ref 0.3–1.2)
Total CHOL/HDL Ratio: 5.3
VLDL: 43 mg/dL — ABNORMAL HIGH (ref 0–40)
WBC: 5.4 10*3/uL (ref 4.0–10.5)

## 2009-11-24 ENCOUNTER — Telehealth (INDEPENDENT_AMBULATORY_CARE_PROVIDER_SITE_OTHER): Payer: Self-pay | Admitting: *Deleted

## 2009-12-01 ENCOUNTER — Encounter (INDEPENDENT_AMBULATORY_CARE_PROVIDER_SITE_OTHER): Payer: Self-pay | Admitting: *Deleted

## 2009-12-11 ENCOUNTER — Ambulatory Visit: Payer: Self-pay | Admitting: Internal Medicine

## 2009-12-11 DIAGNOSIS — E119 Type 2 diabetes mellitus without complications: Secondary | ICD-10-CM

## 2009-12-25 ENCOUNTER — Ambulatory Visit: Payer: Self-pay | Admitting: Internal Medicine

## 2009-12-26 ENCOUNTER — Telehealth (INDEPENDENT_AMBULATORY_CARE_PROVIDER_SITE_OTHER): Payer: Self-pay | Admitting: *Deleted

## 2009-12-26 ENCOUNTER — Telehealth: Payer: Self-pay | Admitting: Internal Medicine

## 2009-12-30 ENCOUNTER — Ambulatory Visit: Payer: Self-pay | Admitting: Internal Medicine

## 2010-01-16 ENCOUNTER — Ambulatory Visit: Payer: Self-pay | Admitting: Infectious Diseases

## 2010-02-09 ENCOUNTER — Telehealth: Payer: Self-pay | Admitting: Internal Medicine

## 2010-02-12 ENCOUNTER — Ambulatory Visit: Payer: Self-pay | Admitting: Internal Medicine

## 2010-02-17 LAB — CONVERTED CEMR LAB
BUN: 10 mg/dL (ref 6–23)
Chloride: 103 meq/L (ref 96–112)
Glucose, Bld: 115 mg/dL — ABNORMAL HIGH (ref 70–99)
LDL Cholesterol: 101 mg/dL — ABNORMAL HIGH (ref 0–99)
Potassium: 4.2 meq/L (ref 3.5–5.3)
Sodium: 140 meq/L (ref 135–145)
Total CHOL/HDL Ratio: 4.8
VLDL: 39 mg/dL (ref 0–40)

## 2010-03-02 ENCOUNTER — Ambulatory Visit: Payer: Self-pay | Admitting: Internal Medicine

## 2010-03-11 ENCOUNTER — Encounter: Payer: Self-pay | Admitting: Internal Medicine

## 2010-03-23 ENCOUNTER — Telehealth: Payer: Self-pay | Admitting: Internal Medicine

## 2010-03-24 ENCOUNTER — Telehealth (INDEPENDENT_AMBULATORY_CARE_PROVIDER_SITE_OTHER): Payer: Self-pay | Admitting: *Deleted

## 2010-03-25 ENCOUNTER — Encounter: Payer: Self-pay | Admitting: Internal Medicine

## 2010-03-26 ENCOUNTER — Ambulatory Visit: Payer: Self-pay | Admitting: Internal Medicine

## 2010-03-26 LAB — CONVERTED CEMR LAB
BUN: 11 mg/dL (ref 6–23)
C-Peptide: 4.14 ng/mL — ABNORMAL HIGH (ref 0.80–3.90)
Chloride: 105 meq/L (ref 96–112)
Creatinine, Ser: 0.84 mg/dL (ref 0.40–1.50)
HIV 1 RNA Quant: <48 copies/mL (ref <48)
HIV-1 RNA Quant, Log: <1.68 (ref <1.68)
Potassium: 3.9 meq/L (ref 3.5–5.3)

## 2010-04-02 ENCOUNTER — Ambulatory Visit: Payer: Self-pay | Admitting: Internal Medicine

## 2010-04-15 ENCOUNTER — Encounter: Payer: Self-pay | Admitting: Internal Medicine

## 2010-06-03 ENCOUNTER — Encounter: Payer: Self-pay | Admitting: Internal Medicine

## 2010-09-03 ENCOUNTER — Encounter: Payer: Self-pay | Admitting: Internal Medicine

## 2010-09-29 ENCOUNTER — Ambulatory Visit: Payer: Self-pay | Admitting: Internal Medicine

## 2010-09-29 LAB — CONVERTED CEMR LAB
BUN: 16 mg/dL (ref 6–23)
Chloride: 107 meq/L (ref 96–112)
Creatinine, Ser: 0.85 mg/dL (ref 0.40–1.50)
Creatinine, Urine: 225.4 mg/dL
HIV 1 RNA Quant: <20 copies/mL (ref <20)
HIV-1 RNA Quant, Log: <1.3 (ref <1.30)
Hemoglobin: 16.4 g/dL (ref 13.0–17.0)
Lymphocytes Relative: 38 % (ref 12–46)
Lymphs Abs: 3 10*3/uL (ref 0.7–4.0)
MCHC: 33.7 g/dL (ref 30.0–36.0)
Microalb Creat Ratio: 165.5 mg/g — ABNORMAL HIGH (ref 0.0–30.0)
Monocytes Absolute: 0.7 10*3/uL (ref 0.1–1.0)
Monocytes Relative: 9 % (ref 3–12)
Neutro Abs: 3.9 10*3/uL (ref 1.7–7.7)
Neutrophils Relative %: 48 % (ref 43–77)
Potassium: 4.4 meq/L (ref 3.5–5.3)
RBC: 5.45 M/uL (ref 4.22–5.81)
WBC: 8 10*3/uL (ref 4.0–10.5)

## 2010-10-15 ENCOUNTER — Ambulatory Visit: Payer: Self-pay | Admitting: Internal Medicine

## 2010-11-11 ENCOUNTER — Encounter (INDEPENDENT_AMBULATORY_CARE_PROVIDER_SITE_OTHER): Payer: Self-pay | Admitting: *Deleted

## 2010-11-23 ENCOUNTER — Encounter: Payer: Self-pay | Admitting: Internal Medicine

## 2010-11-30 ENCOUNTER — Encounter: Payer: Self-pay | Admitting: Internal Medicine

## 2010-12-08 NOTE — Assessment & Plan Note (Signed)
Summary: DIABETES TEACHING.CFB  Is Patient Diabetic? Yes Did you bring your meter with you today? Yes   Allergies: 1)  ! Dilantin   Complete Medication List: 1)  Atripla 600-200-300 Mg Tabs (Efavirenz-emtricitab-tenofovir) .... Take 1 tablet by mouth at bedtime 2)  Metformin Hcl 500 Mg Tabs (Metformin hcl) .... Two tablets with breakfast and two with dinner 3)  Omeprazole 20 Mg Cpdr (Omeprazole) .... Take 1 tablet by mouth once a day 4)  Fenofibrate Micronized 200 Mg Caps (Fenofibrate micronized) .... Take 3 capsules by mouth two times a day 5)  Wellbutrin 75 Mg Tabs (Bupropion hcl) .... Take 1 tablet by mouth once a day 6)  Alprazolam 1 Mg Tabs (Alprazolam) .... Take 1 tablet by mouth three times a day as needed 7)  Bayer Contour Test Strp (Glucose blood) .... Use to check your blood sugar 8)  Lancets 30g Misc (Lancets) .... 30 guage or finer - to test blood sugar  Other Orders: DSMT(Medicare) Individual, 30 Minutes (E4540)  Patient Instructions: 1)  make a follow up in 4-8 weeks.  2)  A1C should be repeated early May.  3)  Frederick Abbott re doing a great job! 4)  Please do not hesitate to call nme anytime- [phone]  Diabetes Self Management Training  PCP: Cliffton Asters MD Date diagnosed with diabetes: 12/10/2009 Diabetes Type: Type 2 non-insulin Other persons present: no Current smoking Status: quit  Assessment Daily activities: no speicific exercise but is busy Sources of Support: sister, partner Special needs or Barriers: work is stressfula nd not supportie of his self care  Coping with Diabetes Feelings about Diabetes: Aware, beginningot accept as he takes action that he sees is helping  Diabetes Medications:  Comments: tolerating metformin without problem- to begin 2000 mg /day tomorrow. recording  ~ 2 CBgs/day with notes about food and activity in logbook. No problem getting strip and lancets- using meter. CBgs often in 200s 2 weeks ago, not approaching targets of <  130before and < 180 after meals    Monitoring Self monitoring blood glucose 2 times a day Name of Meter  contour USB  Time of Testing  Before Breakfast  Before Dinner  Recent Episodes of: Requiring Help from another person  Hyperglycemia : No Hypoglycemia: No Severe Hypoglycemia : No  Other Assessment Had an amputation due to diabetes? No Ever had a foot ulcer or infection?           No      Nutrition assessment ETOH : Yes Amount per day: rarely What do you look at?                                                                                                                 eats very healthy- still some regualr soda in moderation, counting carbs somewhat  Activity Limitations  Inadequate physical activity  Type of physical activity  Housework  Work  Other  thinking about wlkaing dogs on regular basis Diabetes Disease Process  Discussed today Define diabetes in simple terms: Needs  review/assistance   State own type of diabetes: Demonstrates competency   State diabetes is treated by meal plan-exercise-medication-monitoring-education: Demonstrates competency Medications State name-action-dose-duration-side effects-and time to take medication: Needs review/assistanceState appropriate timing of food related to medication: Demonstrates competency Nutritional Management Identify what foods most often affect blood glucose: Demonstrates competencyVerbalize importance of controlling food portions: Insurance account manager purpose and frequency of monitoring BG-ketones-HgbA1C  : Needs review/assistance   State target blood glucose and HgbA1C goals: Needs review/assistance    Complications State the causes-signs and symptoms and prevention of Hyperglycemia: Needs review/assistanceExplain proper treatment of hyperglycemia: Needs review/assistance Exercise States importance of exercise: Demonstrates competency   States effect of exercise on blood  glucose: Demonstrates competency   Verbalizes safety measures for exercise related to diabetes: Demonstrates competency    Lifestyle changes:Goal setting and Problem solving Identify Family/SO role in managing diabetes: Demonstrates competency Psychosocial Adjustment Name two ways of obtaining support from family/friends: Demonstrates competencyDiabetes Management Education Done: 01/16/2010    BEHAVIORAL GOAL FOLLOW UP  Goal attained Specific goal set today: will call patient to establish new goal or continue working on self managment of medication increase and self testing. Under a great deal of stress right now at work.      Diabetes Self Management Support Follow-up:2-4 weeks, then monthly therafter for 4 months , then bimonthly for 6 months

## 2010-12-08 NOTE — Consult Note (Signed)
Summary: [facility]   Imported By: Florinda Marker 05/20/2010 10:15:00  _____________________________________________________________________  External Attachment:    Type:   Image     Comment:   External Document

## 2010-12-08 NOTE — Miscellaneous (Signed)
Summary: Appointment Canceled  Appointment status changed to canceled by [employer] on 02/09/2010 11:41 AM.  Cancellation Comments --------------------- [facility] F/U/VS  Appointment Information ----------------------- Appt Type:  ID OFFICE VISIT      Date:  Thursday, February 12, 2010      Time:  2:30 PM for 15 min   Urgency:  Routine   Made By:  Hoy Finlay Scheduler  To Visit:  Frederick Abbott    Reason:  [facility] F/U/VS  Appt Comments ------------- -- 02/09/10 11:41: ([facility]) CANCELED -- [facility] F/U/VS -- 01/30/10 11:23: ([facility]) BOOKED -- Routine ID OFFICE VISIT at 02/12/2010 2:30 PM for 15 min [facility] F/U/VS

## 2010-12-08 NOTE — Assessment & Plan Note (Signed)
Summary: F/U/VS   CC:  follow-up visit and Depression.  History of Present Illness: Frederick Abbott is in for his routine visit. He states that he has not missed a single dose of his Atripla and he is doing much better taking his gemfibrozil. He does feel Wellbutrin has helped his depression and anxiety.  He says that he only needs to take his alprazolam one to two times each week. When asked how his mood has been he says I need to get a new job.  He feels that job-related stress is the culprit.  He says it's been affecting his relationship [name].  He does not want to see a counselor.  He wants to focus on getting a new job.  He has not noticed any more problems with hearing loss, sensitivity to noise or ringing in his ears.  His hearing test was normal.  He has been aware of losing weight.  He says he has a fairly dry mouth and he thinks he may be drinking a little more water than usual as a result.  He denies polyuria and nocturia.  Depression History:      The patient denies a depressed mood most of the day and a diminished interest in his usual daily activities.         Preventive Screening-Counseling & Management  Alcohol-Tobacco     Alcohol drinks/day: <1     Alcohol type: beer     Smoking Status: quit     Smoking Cessation Counseling: 05/04/2005     Year Quit: 2006     Passive Smoke Exposure: no  Caffeine-Diet-Exercise     Caffeine use/day: yes     Does Patient Exercise: no  Hep-HIV-STD-Contraception     HIV Risk: risk noted     HIV Risk Counseling: not indicated-no HIV risk noted  Safety-Violence-Falls     Seat Belt Use: yes  Comments: declined condoms      Sexual History:  currently monogamous.        Drug Use:  marijuana and occas.     Prior Medication List:  ATRIPLA 600-200-300 MG  TABS (EFAVIRENZ-EMTRICITAB-TENOFOVIR) Take 1 tablet by mouth at bedtime PREVACID 30 MG  CPDR (LANSOPRAZOLE) Take 1 tablet by mouth once a day ALDARA 5 % CREA (IMIQUIMOD) Apply every  Monday, Wednesday and Friday FENOFIBRATE MICRONIZED 200 MG CAPS (FENOFIBRATE MICRONIZED) Take 3 capsules by mouth two times a day WELLBUTRIN 75 MG TABS (BUPROPION HCL) Take 1 tablet by mouth once a day ALPRAZOLAM 1 MG  TABS (ALPRAZOLAM) Take 1 tablet by mouth three times a day as needed   Current Allergies (reviewed today): ! DILANTIN Social History: Drug Use:  marijuana, occas  Vital Signs:  Patient profile:   40 year old male Height:      70 inches (177.80 cm) Weight:      161.7 pounds (73.50 kg) BMI:     23.29 Temp:     96.7 degrees F (35.94 degrees C) Pulse rate:   81 / minute BP sitting:   131 / 78  (left arm) Cuff size:   regular  Vitals Entered By: Jennet Maduro RN (December 11, 2009 9:56 AM) CC: follow-up visit, Depression Is Patient Diabetic? No Pain Assessment Patient in pain? no      Nutritional Status BMI of 19 -24 = normal Nutritional Status Detail appetite "ggod"  Have you ever been in a relationship where you felt threatened, hurt or afraid?No   Does patient need assistance? Functional Status Self care  Ambulation Normal Comments no missed doses of HIV rxes        Medication Adherence: 12/11/2009   Adherence to medications reviewed with patient. Counseling to provide adequate adherence provided   Prevention For Positives: 12/11/2009   Safe sex practices discussed with patient. Condoms offered.                             Physical Exam  General:  alert and well-nourished.  his weight is down about 10 pounds.  There is no change in his bilateral parotid swelling. Mouth:  good dentition and pharynx pink and moist.   Lungs:  normal breath sounds, no crackles, and no wheezes.   Heart:  normal rate, regular rhythm, and no murmur.   Abdomen:  soft, non-tender, normal bowel sounds, and no masses.   Skin:  no rashes.   Psych:  normally interactive, good eye contact, not anxious appearing, and not depressed appearing.     Impression &  Recommendations:  Problem # 1:  HIV DISEASE (ICD-042) His HIV remains under good control. Diagnostics Reviewed:  CD4: 640 (11/18/2009)   WBC: 5.4 (11/17/2009)   Hgb: 17.3 (11/17/2009)   HCT: 49.3 (11/17/2009)   Platelets: 277 (11/17/2009) HIV-1 RNA: 64 (11/17/2009)   HBSAg: No (01/02/2007)  Problem # 2:  HYPERGLYCEMIA (ICD-790.29) He may be progressing to early type 2 diabetes.  I will check a hemoglobin A1c today. Orders: Est. Patient Level IV (33295) T-Capillary Blood Glucose (82948) T- Hemoglobin A1C (18841-66063)  Labs Reviewed: Creat: 0.91 (11/17/2009)     Problem # 3:  DYSLIPIDEMIA (ICD-272.4)  His triglycerides are much better since he has restarted his gemfibrozil. His updated medication list for this problem includes:    Fenofibrate Micronized 200 Mg Caps (Fenofibrate micronized) .Marland Kitchen... Take 3 capsules by mouth two times a day  Orders: Est. Patient Level IV (01601)  Labs Reviewed: SGOT: 40 (11/17/2009)   SGPT: 43 (11/17/2009)   HDL:42 (11/17/2009), 38 (08/22/2009)  LDL:137 (11/17/2009), * mg/dL (09/32/3557)  DUKG:254 (11/17/2009), 219 (08/22/2009)  Trig:213 (11/17/2009), 620 (08/22/2009)  Problem # 4:  DEPRESSION (ICD-311) His depression is better. His updated medication list for this problem includes:    Wellbutrin 75 Mg Tabs (Bupropion hcl) .Marland Kitchen... Take 1 tablet by mouth once a day    Alprazolam 1 Mg Tabs (Alprazolam) .Marland Kitchen... Take 1 tablet by mouth three times a day as needed  Orders: Est. Patient Level IV (27062)  Medications Added to Medication List This Visit: 1)  Omeprazole 20 Mg Cpdr (Omeprazole) .... Take 1 tablet by mouth once a day  Patient Instructions: 1)  Please schedule a follow-up appointment in 2 weeks.  Process Orders Check Orders Results:     [facility]: Order checked:      -- T-Capillary Blood Glucose --  NO TEST CODE (CPT: ) Tests Sent for requisitioning (December 11, 2009 10:19 AM):     12/11/2009: [facility] -- T-Capillary Blood Glucose [[id number]] (signed)     12/11/2009: [facility] -- T- Hemoglobin A1C [[id number]] (signed)   Appended Document: Lab Order/A1C results    Lab Visit  Laboratory Results   Blood Tests   Date/Time Received: December 11, 2009 10:42 AM Date/Time Reported: Burke Keels  December 11, 2009 10:42 AM  HGBA1C: 10.4%   (Normal Range: Non-Diabetic - 3-6%   Control Diabetic - 6-8%) CBG Random:: 269mg /dL    Orders Today:   Appended Document:  F/U/VS     Allergies: 1)  ! Dilantin   Impression & Recommendations:  Problem # 1:  DIABETES MELLITUS, TYPE II (ICD-250.00)  Frederick Abbott' hemoglobin A1c is 10.4.  I will make a referral to Dr. Victory Dakin to begin education. Labs Reviewed: Creat: 0.91 (11/17/2009)    Reviewed HgBA1c results: 10.4 (12/11/2009)  Orders: Misc. Referral (Misc. Ref)

## 2010-12-08 NOTE — Assessment & Plan Note (Signed)
Summary: DM TRAINING/VS   Vital Signs:  Patient profile:   40 year old male Weight:      164.8 pounds BMI:     23.73 Is Patient Diabetic? Yes Did you bring your meter with you today? No-brought logbook   Allergies: 1)  ! Dilantin   Complete Medication List: 1)  Atripla 600-200-300 Mg Tabs (Efavirenz-emtricitab-tenofovir) .... Take 1 tablet by mouth at bedtime 2)  Metformin Hcl 500 Mg Tabs (Metformin hcl) .... Two tablets with breakfast and two with dinner 3)  Omeprazole 20 Mg Cpdr (Omeprazole) .... Take 1 tablet by mouth once a day 4)  Fenofibrate Micronized 200 Mg Caps (Fenofibrate micronized) .... Take 3 capsules by mouth two times a day 5)  Wellbutrin 75 Mg Tabs (Bupropion hcl) .... Take 1 tablet by mouth once a day 6)  Alprazolam 1 Mg Tabs (Alprazolam) .... Take 1 tablet by mouth three times a day as needed 7)  Bayer Contour Test Strp (Glucose blood) .... Use to check your blood sugar 8)  Lancets 30g Misc (Lancets) .... 30 guage or finer - to test blood sugar  Other Orders: Future Orders: T-Hgb A1C (in-house) ([id number]) ... 03/10/2010  Diabetes Self Management Training  PCP: Cliffton Asters MD Date diagnosed with diabetes: 12/10/2009 Diabetes Type: Type 2 non-insulin Other persons present: no Current smoking Status: quit  Vital Signs Todays Weight: 164.8lb  in BMI 23.73in-lbs   Assessment Daily activities: no speicific exercise but is busy Sources of Support: friends, family- will doscuss at next visit sin more detail about long term support plan  Coping with Diabetes Feelings about Diabetes: Acceptance  Diabetes Medications:  Comments: CBGs in logbook: checking a few times a week,mostly in the  100s and low 200s in evenings .A1C due on May, suspect he will need additional behavior change or medication to reach target A1C of 6.5%.     Monitoring Self monitoring blood glucose 2 times a day Name of Meter  contour USB  Recent Episodes of: Requiring Help from  another person  Hyperglycemia : Yes Hypoglycemia: No     Estimated /Usual Carb Intake Breakfast # of Carbs/Grams [employer] breakfast sandwich, sometimes cereal Midafternoon # of Carbs/Grams soda Dinner # of Carbs/Grams vegetables, meat  Nutrition assessment Weight change: no Desired Weight: 160 ETOH : Yes Amount per day: rarely How many meals per week do you eat away from home?   1-2 If you eat away from home , what type of restaurant do you choose?  Sit -down restaurant What beverages do you drink?  regular soda 2-3 12 oz per day, some milk, water Do you read food labels?                                                                          No What do you look at?  reeiwed today along with purpose adn how to use carb counting as a tool to improve CBGs. Biggest challenge to eating healthy: Imbalance CHO and skips meals  Activity Limitations  Inadequate physical activity Diabetes Disease Process  Discussed today Define diabetes in simple terms: Demonstrates competency Medications State name-action-dose-duration-side effects-and time to take medication: Demonstrates competency Nutritional Management Identify what foods most often affect blood glucose: Demonstrates competency    Verbalize importance of controlling food portions: Demonstrates competency   State importance of spacing and not omitting meals and snacks: Needs review/assistance    Monitoring State purpose and frequency of monitoring BG-ketones-HgbA1C  : Demonstrates competencyState target blood glucose and HgbA1C goals: Demonstrates competency Complications State the causes-signs and symptoms and prevention of Hyperglycemia: Needs review/assistance   Explain proper treatment of hyperglycemia: Needs review/assistance   State the causes- signs and symptoms and prevention of hypoglycemia: Not  applicableExplain proper treatment of hypoglycemia: Not applicable Exercise  Lifestyle changes:Goal setting and Problem solving State benefits of making appropriate lifestyle changes: Needs review/assistance   Identify lifestyle behaviors that need to change: Needs review/assistance   Identify risk factors that interfere with health: Needs review/assistance   Develop strategies to reduce risk factors: Needs review/assistance   Diabetes Management Education Done: 02/12/2010    BEHAVIORAL GOAL FOLLOW UP Preventing,detecting and treating complications: to get A1C checked and make follow-up sooner if not at target      requested A1C order for May. Discussed diabetes self management support plan: patient feels he learns and gets support by tlkaing to others about his diabetes. Need to cover with him: self care tests for diabetes- eye exams, foot exams, micral as well. Diabetes Self Management Support: see above- family, friends and clinic staff. Follow-up:May or June depending on need, then3- 4 months after that an dthen annually

## 2010-12-08 NOTE — Assessment & Plan Note (Signed)
Summary: F/U OV/VS   CC:  follow-up visit and wanting to talk  about referral to "diabetic" specialist.  History of Present Illness: Frederick Abbott is  in for his routine visit.  He started on glipizide recently after he could not tolerate metformin.  His nausea and vomiting have resolved completely and he feels like he is tolerating his glipizide well. His blood sugars are still running high he can have ranged from 160 to 248 recently.  He is working hard on dietary modification and exercise.  He and his sister had been interested in C-peptide testing for him because they were concerned that he might have type 1 diabetes given his strong family history of diabetes.  He has not missed a single dose of his Atripla  since his last visit.  Preventive Screening-Counseling & Management  Alcohol-Tobacco     Alcohol drinks/day: <1     Alcohol type: beer     Smoking Status: quit     Smoking Cessation Counseling: 05/04/2005     Year Quit: 2006     Passive Smoke Exposure: no  Caffeine-Diet-Exercise     Caffeine use/day: yes     Does Patient Exercise: yes     Type of exercise: walking     Exercise (avg: min/session): <30     Times/week: <3  Hep-HIV-STD-Contraception     HIV Risk: risk noted     HIV Risk Counseling: not indicated-no HIV risk noted  Safety-Violence-Falls     Seat Belt Use: yes      Sexual History:  currently monogamous.        Drug Use:  never.     Prior Medication List:  ATRIPLA 600-200-300 MG  TABS (EFAVIRENZ-EMTRICITAB-TENOFOVIR) Take 1 tablet by mouth at bedtime GLIPIZIDE 5 MG XR24H-TAB (GLIPIZIDE) Take 1 tablet by mouth once a day OMEPRAZOLE 20 MG CPDR (OMEPRAZOLE) Take 1 capsule by mouth two times a day FENOFIBRATE MICRONIZED 200 MG CAPS (FENOFIBRATE MICRONIZED) Take 3 capsules by mouth two times a day WELLBUTRIN 75 MG TABS (BUPROPION HCL) Take 1 tablet by mouth once a day CLARITIN REDITABS 5 MG TBDP (LORATADINE) Take 1 tablet by mouth once a day ALPRAZOLAM 1 MG  TABS  (ALPRAZOLAM) Take 1 tablet by mouth three times a day as needed BAYER CONTOUR TEST  STRP (GLUCOSE BLOOD) use to check your blood sugar [BMN] LANCETS 30G  MISC (LANCETS) 30 guage or finer - to test blood sugar   Current Allergies (reviewed today): ! DILANTIN Social History: Drug Use:  never  Vital Signs:  Patient profile:   40 year old male Height:      70 inches (177.80 cm) Weight:      167.25 pounds (76.02 kg) BMI:     24.08 Temp:     97.6 degrees F (36.44 degrees C) oral Pulse rate:   93 / minute BP sitting:   132 / 89  (left arm) Cuff size:   regular  Vitals Entered By: Frederick Maduro RN (Apr 02, 2010 10:04 AM) CC: follow-up visit,  wanting to talk  about referral to "diabetic" specialist Is Patient Diabetic? Yes Did you bring your meter with you today? 248 Pain Assessment Patient in pain? no      Nutritional Status BMI of 19 -24 = normal Nutritional Status Detail appetite "too good"  Have you ever been in a relationship where you felt threatened, hurt or afraid?No   Does patient need assistance? Functional Status Self care Ambulation Normal Comments no missed doses   Physical Exam  General:  alert, well-nourished.   Head:  no change in his bilateral, symmetrical parotid hypertrophy. Mouth:  good dentition and pharynx pink and moist.   Lungs:  normal breath sounds, no crackles, and no wheezes.   Heart:  normal rate, regular rhythm, and no murmur.          Medication Adherence: 04/02/2010   Adherence to medications reviewed with patient. Counseling to provide adequate adherence provided                                 Impression & Recommendations:  Problem # 1:  HIV DISEASE (ICD-042) His HIV infection remains under excellent control.  I will continue his current regimen. Diagnostics Reviewed:  CD4: 820 (03/27/2010)   WBC: 5.4 (11/17/2009)   Hgb: 17.3 (11/17/2009)   HCT: 49.3 (11/17/2009)   Platelets: 277 (11/17/2009) HIV-1 RNA: <48 copies/mL  (03/26/2010)   HBSAg: No (01/02/2007)  Problem # 2:  DIABETES MELLITUS, TYPE II (ICD-250.00) Frederick Abbott it is doing better on glipizide and although his random glucose levels are still elevated his hemoglobin A1c has improved significantly from 10.2 in February to 7.7.  I did obtain a C-peptide level which was slightly elevated at 4.4 indicating he is more than likely a type II diabetic.  I did tell him that there is still a strong likelihood that he will need insulin and eventually.  I will increase his glipizide today.  He and his sister are interested in an endocrinology referral and I will try to arrange that. His updated medication list for this problem includes:    Glipizide 10 Mg Xr24h-tab (Glipizide) .Marland Kitchen... Take 1 tablet by mouth once a day  Orders: Est. Patient Level IV (78295) Endocrinology Referral (Endocrine)Future Orders: T-Urine Microalbumin w/creat. ratio [phone]) ... 06/23/2010  Labs Reviewed: Creat: 0.84 (03/26/2010)    Reviewed HgBA1c results: 7.7 (03/26/2010)  10.4 (12/11/2009)  Medications Added to Medication List This Visit: 1)  Glipizide 10 Mg Xr24h-tab (Glipizide) .... Take 1 tablet by mouth once a day  Other Orders: Future Orders: T-CD4SP ([facility]) (CD4SP) ... 07/01/2010 T-HIV Viral Load [phone]) ... 07/01/2010 T-Basic Metabolic Panel [phone]) ... 07/01/2010 T-Lipid Profile [phone]) ... 07/01/2010  Patient Instructions: 1)  Please schedule a follow-up appointment in 3 months. Prescriptions: GLIPIZIDE 10 MG XR24H-TAB (GLIPIZIDE) Take 1 tablet by mouth once a day  #30 x 11   Entered and Authorized by:   Cliffton Asters MD   Signed by:   Cliffton Asters MD on 04/02/2010   Method used:   Electronically to        [facility]. [phone]* (retail)       [address]       [place], [place]  [address]       Ph: [phone]       Fax: [phone]   RxID:   [id number]

## 2010-12-08 NOTE — Assessment & Plan Note (Signed)
Summary: DM TRAINING/VS  CBG Result 174 CBG Device ID contour USB Comments this is a before meal blood sugar per patient   Allergies: 1)  ! Dilantin   Complete Medication List: 1)  Atripla 600-200-300 Mg Tabs (Efavirenz-emtricitab-tenofovir) .... Take 1 tablet by mouth at bedtime 2)  Omeprazole 20 Mg Cpdr (Omeprazole) .... Take 1 tablet by mouth once a day 3)  Fenofibrate Micronized 200 Mg Caps (Fenofibrate micronized) .... Take 3 capsules by mouth two times a day 4)  Wellbutrin 75 Mg Tabs (Bupropion hcl) .... Take 1 tablet by mouth once a day 5)  Alprazolam 1 Mg Tabs (Alprazolam) .... Take 1 tablet by mouth three times a day as needed  Other Orders: DSMT(Medicare) Individual, 30 Minutes ([id number])  Diabetes Self Management Training  PCP: Cliffton Asters MD Date diagnosed with diabetes: 12/10/2009 Diabetes Type: Type 2 non-insulin Current smoking Status: quit  Assessment Work Hours: Full Time Type of Work: works at [employer] activities: no speicific exercise but is busy Sources of Support: ? mother and partner Special needs or Barriers: grandfather lost his foot to diabetes, mother laso diagnosed in the past 10 years Affect: Appropriate Readiness to learn:   Preparation  Potential Barriers  Demonstrates competency  Coping with Diabetes Feelings about Diabetes: Denial mixed with awareness  Diabetes Medications:  Comments: nomedications at this time, but would recoemmend starting metformin ASAP. will discuss with DR. [name] gave patient a ascencia USB meter and instructed hi on it's use. He was given 19 strips, but will need prescriptions for more.Anticipate patient will use self monitoig to help him understadn his diabetes    Monitoring Self monitoring blood glucose 2 times a day Name of Meter  contour USB      Nutrition assessment Weight change: Loss Amount of change: 20 pounds in the past 6 months Desired Weight: 160-170 ETOH : Yes Amount per day:  rarely What beverages do you drink?  regular soda, but has been tryign to cut down Do you read food labels?                                                                          Yes What do you look at?                                                                                                                 carbs and sugar Biggest challenge to eating healthy: Imbalance CHO- needs to sprad what he needs out as much as possible  Activity Limitations  Inadequate physical activity Diabetes Disease Process  Discussed today Define diabetes in simple terms: No knowledge   State own type of diabetes: No knowledge   State diabetes is treated by meal plan-exercise-medication-monitoring-education: Needs review/assistance  Medications  Nutritional Management Identify what foods most often affect blood glucose: Needs review/assistance    Verbalize importance of controlling food portions: Needs review/assistance   State importance of spacing and not omitting meals and snacks: No knowledge   State changes planned for home meals/snacks: Demonstrates competency    Monitoring    Perform glucose monitoring/ketone testing and record results correctly: Demonstrates competency        Complications State the causes- signs and symptoms and prevention of hypoglycemia: Needs review/assistance   Explain proper treatment of hypoglycemia: Needs review/assistance    Exercise  Lifestyle changes:Goal setting and Problem solving State benefits of making appropriate lifestyle changes: Needs review/assistance   Identify lifestyle behaviors that need to change: Needs review/assistance   Identify risk factors that interfere with health: Needs review/assistance   Develop strategies to reduce risk factors: Needs review/assistance   Verbalize need for and frequency of health care follow-up: Needs review/assistance    Psychosocial Adjustment State three common feelings that might be experienced when  learning to cope with diabetes: Demonstrates competency   Identify two methods to cope with these feelings: Demonstrates competency   Identify how stress affects diabetes & two sources of stress: Needs review/assistance   Diabetes Management Education Done: 12/25/2009    BEHAVIORAL GOALS INITIAL Monitoring blood glucose levels daily: check blood sugars- bring meter back to visit        Diabetes Self Management Support Follow-up:2-4 weeks, then monthly therafter for 4 months , then bimonthly for 6 months    Appended Document: DM TRAINING/VS

## 2010-12-08 NOTE — Assessment & Plan Note (Signed)
Summary: 2 WK F/U /DDE   CC:  follow-up visit and Depression.  History of Present Illness: Frederick Abbott is in for his routine visit.  He met with Jamison Neighbor recently and has started monitoring his blood sugars several times daily.  He started metformin 3 days ago and has tolerated it well so far.  He has not noted any change in his blood sugars yet.  He has is written log book with him and his blood sugars have been running between 150 and 350.  He has not been getting any regular exercise.  However, he and his partner Frederick Abbott have been considering starting to ride bikes again.  His mood has been fairly good but he still has a great deal of work related stress.  Depression History:      The patient denies a depressed mood most of the day and a diminished interest in his usual daily activities.        Preventive Screening-Counseling & Management  Alcohol-Tobacco     Alcohol drinks/day: <1     Alcohol type: beer     Smoking Status: quit     Smoking Cessation Counseling: 05/04/2005     Year Quit: 2006     Passive Smoke Exposure: no  Caffeine-Diet-Exercise     Caffeine use/day: yes     Does Patient Exercise: no  Hep-HIV-STD-Contraception     HIV Risk: risk noted     HIV Risk Counseling: not indicated-no HIV risk noted  Safety-Violence-Falls     Seat Belt Use: yes  Comments: declined condoms      Sexual History:  currently monogamous.        Drug Use:  marijuana.     Prior Medication List:  ATRIPLA 600-200-300 MG  TABS (EFAVIRENZ-EMTRICITAB-TENOFOVIR) Take 1 tablet by mouth at bedtime METFORMIN HCL 500 MG TABS (METFORMIN HCL) 500 mg with breakfast x 1 week,  increase by 500 mg each week until tkaing 1000mg  with breakfast and dinner OMEPRAZOLE 20 MG CPDR (OMEPRAZOLE) Take 1 tablet by mouth once a day FENOFIBRATE MICRONIZED 200 MG CAPS (FENOFIBRATE MICRONIZED) Take 3 capsules by mouth two times a day WELLBUTRIN 75 MG TABS (BUPROPION HCL) Take 1 tablet by mouth once a day ALPRAZOLAM 1 MG   TABS (ALPRAZOLAM) Take 1 tablet by mouth three times a day as needed BAYER CONTOUR TEST  STRP (GLUCOSE BLOOD) use to check your blood sugar [BMN] LANCETS 30G  MISC (LANCETS) 30 guage or finer - to test blood sugar   Current Allergies (reviewed today): ! DILANTIN Social History: Drug Use:  marijuana  Vital Signs:  Patient profile:   40 year old male Height:      70 inches (177.80 cm) Weight:      164.3 pounds (74.68 kg) BMI:     23.66 Temp:     97.6 degrees F (36.44 degrees C) Pulse rate:   93 / minute BP sitting:   128 / 80  (left arm) Cuff size:   regular  Vitals Entered By: Wendall Mola CMA Duncan Dull) (December 30, 2009 10:08 AM) CC: follow-up visit, Depression Is Patient Diabetic? Yes Did you bring your meter with you today? 230 @ 5:30 AM Pain Assessment Patient in pain? no      Nutritional Status BMI of 19 -24 = normal Nutritional Status Detail appetite "normal"  Have you ever been in a relationship where you felt threatened, hurt or afraid?No   Does patient need assistance? Functional Status Self care Ambulation Impaired:Risk for fall Comments no  missed doses of rxes   Physical Exam  General:  alert, well-nourished.   Mouth:  good dentition and pharynx pink and moist.   Lungs:  normal breath sounds, no crackles, and no wheezes.   Heart:  normal rate, regular rhythm, and no murmur.   Psych:  normally interactive, good eye contact, not anxious appearing, and not depressed appearing.      Impression & Recommendations:  Problem # 1:  HIV DISEASE (ICD-042) I will not make any changes today. Diagnostics Reviewed:  CD4: 640 (11/18/2009)   WBC: 5.4 (11/17/2009)   Hgb: 17.3 (11/17/2009)   HCT: 49.3 (11/17/2009)   Platelets: 277 (11/17/2009) HIV-1 RNA: 64 (11/17/2009)   HBSAg: No (01/02/2007)  Problem # 2:  DIABETES MELLITUS, TYPE II (ICD-250.00) It is too soon for him to notice the impact of metformin.  He will increase his dose weekly over the next two  weeks.  I have encouraged him to get some regular exercise. His updated medication list for this problem includes:    Metformin Hcl 500 Mg Tabs (Metformin hcl) .Marland KitchenMarland KitchenMarland KitchenMarland Kitchen 500 mg with breakfast x 1 week,  increase by 500 mg each week until tkaing 1000mg  with breakfast and dinner  Labs Reviewed: Creat: 0.91 (11/17/2009)    Reviewed HgBA1c results: 10.4 (12/11/2009)  Orders: Est. Patient Level III (99213)Future Orders: T-Basic Metabolic Panel [id number]) ... 02/03/2010 T-Lipid Profile [id number]) ... 02/03/2010  Problem # 3:  DEPRESSION (ICD-311) His mood has improved over the last 6 months. His updated medication list for this problem includes:    Wellbutrin 75 Mg Tabs (Bupropion hcl) .Marland Kitchen... Take 1 tablet by mouth once a day    Alprazolam 1 Mg Tabs (Alprazolam) .Marland Kitchen... Take 1 tablet by mouth three times a day as needed  Orders: Est. Patient Level III (99213)Future Orders: T-Basic Metabolic Panel [id number]) ... 02/03/2010 T-Lipid Profile [id number]) ... 02/03/2010  Patient Instructions: 1)  Please schedule a follow-up appointment in 6 weeks.  Process Orders Check Orders Results:     [facility]: ABN not required for this insurance Tests Sent for requisitioning (December 30, 2009 10:34 AM):     02/03/2010: [facility] -- T-Basic Metabolic Panel [phone] (signed)     02/03/2010: [facility] -- T-Lipid Profile [phone] (signed)

## 2010-12-08 NOTE — Miscellaneous (Signed)
Summary: clinical update/ryan white  Clinical Lists Changes  Observations: Added new observation of PCTFPL: 31.39  (12/01/2009 13:43) Added new observation of FINASSESSDT: 11/17/2009  (12/01/2009 13:43) Added new observation of HOUSEINCOME: 3400  (12/01/2009 13:43) Added new observation of YEARLYEXPEN: 4780  (12/01/2009 13:43) Added new observation of RW VITAL STA: Active  (12/01/2009 13:43)

## 2010-12-08 NOTE — Progress Notes (Signed)
Summary: Problems with metformin  Phone Note Call from Patient   Caller: Patient  [phone] or [phone] Summary of Call: Pt states he is no longer going to take the metformin due to extreme daily nausea.  He wonders if there is another option available but he will not continue the metformin.  Tomasita Morrow RN  Mar 23, 2010 11:47 AM   Follow-up for Phone Call        I agree with Jamison Neighbor and recomment retrying metformin at a lower dose before moving to alternatives. Follow-up by: Cliffton Asters MD,  Mar 24, 2010 12:16 PM    New/Updated Medications: METFORMIN HCL 500 MG TABS (METFORMIN HCL) one tablet with breakfast and one with dinner

## 2010-12-08 NOTE — Progress Notes (Signed)
Summary: diabetes support/dmr  Phone Note Call from Patient Call back at Home Phone [phone]   Caller: Patient Summary of Call: Patient called to discuss problems with  feeling bad when taking metformin. he reports this at all doses. Has decided to discontniue it. This is new information. Encouraged him to consider retrying at smaller doses, could try splitting 500 mg and taking 250mg  with breakfast and dinner. If he cannot tolerate this or does not control blood sugars adequately, discussed pros and cons of other meications including : lantus insulin, glipizide ER 2.5-5 mg daily to start, [place].  He is also due for repeat A1C.  He reports his sister continues to ask about him getting a C-peptide. May wish to consider IAAs, ICAs and GAD antibodies with c-peptide. Mother diagnosed with T2 dm late in life and maternal granparent also with diabetes.   Thirdly, discussed insurance and [employer] coverage for [employer] visits. His insuracne denied previous visits despite them telling me he had  coverage for 5 visits.  Initial call taken by: Jamison Neighbor RD,CDE,  Mar 23, 2010 10:58 AM

## 2010-12-08 NOTE — Progress Notes (Signed)
Summary: refill/mld  Phone Note From Pharmacy   Caller: [facility] Reason for Call: Needs renewal Summary of Call: [facility] for a 90 day refill on patient's Atripla. Patient's ref # is [id number]. The phone number to pharmacy is [phone] or fax [phone]. Initial call taken by: Paulo Fruit  BS,CPht II,MPH,  November 24, 2009 11:49 AM    Prescriptions: ATRIPLA 600-200-300 MG  TABS (EFAVIRENZ-EMTRICITAB-TENOFOVIR) Take 1 tablet by mouth at bedtime  #90 x 3   Entered by:   Paulo Fruit  BS,CPht II,MPH   Authorized by:   Cliffton Asters MD   Signed by:   Paulo Fruit  BS,CPht II,MPH on 11/24/2009   Method used:   Telephoned to ...       [facility] Fax) (mail-order)             ,          Ph: [phone]       Fax: [phone]   RxID:   [id number]  Spoke to Judeth Horn who read back the order Paulo Fruit  BS,CPht II,MPH  November 24, 2009 11:50 AM

## 2010-12-08 NOTE — Medication Information (Signed)
Summary: [facility]: RX  [facility]: RX   Imported By: Florinda Marker 03/11/2010 16:10:37  _____________________________________________________________________  External Attachment:    Type:   Image     Comment:   External Document

## 2010-12-08 NOTE — Progress Notes (Signed)
Summary: diabetes med & testing supplies  Phone Note Call from Patient Call back at Home Phone [phone]   Caller: Patient Summary of Call: he is ready to begin medication because he is concerned about high blood sugars. I agree because he does not hae weight toloose and meal planning.exercise wil not get himto goal.  blood sugars: [phone] yesterday                         198, 225 today Counseled patient on side effects nad self titration of metfomrin- wil request prescription for that and testingsupplies be sent ot [facility] on [address], [phone] Initial call taken by: Jamison Neighbor RD,CDE,  December 26, 2009 4:47 PM    New/Updated Medications: METFORMIN HCL 500 MG TABS (METFORMIN HCL) 500 mg with breakfast x 1 week,  increase by 500 mg each week until tkaing 1000mg  with breakfast and dinner Prescriptions: METFORMIN HCL 500 MG TABS (METFORMIN HCL) 500 mg with breakfast x 1 week,  increase by 500 mg each week until tkaing 1000mg  with breakfast and dinner  #120 x 5   Entered by:   Jamison Neighbor RD,CDE   Authorized by:   Cliffton Asters MD   Signed by:   Jennet Maduro RN on 12/26/2009   Method used:   Electronically to        [facility] [phone]* (retail)       [address]       [place], [place]  [address]       Ph: [phone]       Fax: [phone]   RxID:   [id number] LANCETS 30G  MISC (LANCETS) 30 guage or finer - to test blood sugar  #100 x 5   Entered by:   Jamison Neighbor RD,CDE   Authorized by:   Cliffton Asters MD   Signed by:   Jennet Maduro RN on 12/26/2009   Method used:   Electronically to        [facility]. [phone]* (retail)       [address]       [place], [place]  [address]       Ph: [phone]       Fax: [phone]   RxID:   [id number] BAYER CONTOUR TEST  STRP (GLUCOSE BLOOD) use to check your blood sugar Brand medically necessary #100 x 11   Entered by:    Jamison Neighbor RD,CDE   Authorized by:   Cliffton Asters MD   Signed by:   Jennet Maduro RN on 12/26/2009   Method used:   Electronically to        [facility] [phone]* (retail)       [address]       [place], [place]  [address]       Ph: [phone]       Fax: [phone]   RxID:   [id number]

## 2010-12-08 NOTE — Progress Notes (Signed)
----   Converted from flag ---- ---- 12/26/2009 8:36 AM, Cliffton Asters MD wrote: Frederick Abbott,  Thanks.  Frederick Abbott  ---- 12/25/2009 1:20 PM, Jamison Neighbor RD,CDE wrote: Thank you for the referral, Very nice gentleman- feel he'll need metformin. would suggest  500 mg with breakfast x 1 week,  500 mg with breakfast and dinner for 1 week,  500 mg with breakfast and 1000 mg with dinner x 1 week, 1000 mg with breakfast and dinner there after.    He wanted to check his blood sugar for a few weeks first. CBG premeal was 174 today. would recommend target A1C of < 6.5%. Feel he will do well.  Frederick Abbott ------------------------------

## 2010-12-08 NOTE — Assessment & Plan Note (Signed)
Summary: 6WK F/U/VS   CC:  follow-up visit, belching followed by vomitting  and diarrhea for up to 12 hours, and Depression.  History of Present Illness: Frederick Abbott is in for his routine visit.  He is not changed any of his medications since his last visit.  He denies missing any doses.  Recently he has been having problems with more indigestion and perhaps twice a week he will have nausea with a sour taste in his mouth.  On two to 3 occasions in the past two months he has had multiple bouts of vomiting.  He has also noticed more frequent belching. He also notes intermittent diarrhea which is not unusual for him.  He has not had any fever, chills, or sweats.  He thinks his more recent symptoms began around the time he started metformin.  He has been monitoring his blood sugar several times each week.  He tries to vary the time of day he checks it.  His blood sugars have been in the 120 to 190 range.  He is still not getting any regular exercise.  He says he splurged around the time of his recent birthday and was eating more cake than usual.  He says that he thinks it is can take more time for him to adjust what he eats.  He still is drinking two to 3 regular sodas daily and says that he has not made any significant change in what he eats or how much he eats.  He says that he is more aware of when he is eating carbohydrates.  Depression History:      The patient denies a depressed mood most of the day and a diminished interest in his usual daily activities.         Preventive Screening-Counseling & Management  Alcohol-Tobacco     Alcohol drinks/day: <1     Alcohol type: beer     Smoking Status: quit     Smoking Cessation Counseling: 05/04/2005     Year Quit: 2006     Passive Smoke Exposure: no  Caffeine-Diet-Exercise     Caffeine use/day: yes     Does Patient Exercise: no  Hep-HIV-STD-Contraception     HIV Risk: risk noted     HIV Risk Counseling: not indicated-no HIV risk  noted  Safety-Violence-Falls     Seat Belt Use: yes      Sexual History:  currently monogamous.        Drug Use:  former.     Updated Prior Medication List: ATRIPLA 600-200-300 MG  TABS (EFAVIRENZ-EMTRICITAB-TENOFOVIR) Take 1 tablet by mouth at bedtime METFORMIN HCL 500 MG TABS (METFORMIN HCL) two tablets with breakfast and two with dinner OMEPRAZOLE 20 MG CPDR (OMEPRAZOLE) Take 1 tablet by mouth once a day FENOFIBRATE MICRONIZED 200 MG CAPS (FENOFIBRATE MICRONIZED) Take 3 capsules by mouth two times a day WELLBUTRIN 75 MG TABS (BUPROPION HCL) Take 1 tablet by mouth once a day CLARITIN REDITABS 5 MG TBDP (LORATADINE) Take 1 tablet by mouth once a day ALPRAZOLAM 1 MG  TABS (ALPRAZOLAM) Take 1 tablet by mouth three times a day as needed BAYER CONTOUR TEST  STRP (GLUCOSE BLOOD) use to check your blood sugar [BMN] LANCETS 30G  MISC (LANCETS) 30 guage or finer - to test blood sugar  Current Allergies (reviewed today): ! DILANTIN Social History: Drug Use:  former  Vital Signs:  Patient profile:   40 year old male Height:      70 inches (177.80 cm) Weight:  166.5 pounds (75.68 kg) BMI:     23.98 Temp:     98.4 degrees F oral Pulse rate:   105 / minute BP sitting:   151 / 94  (left arm) Cuff size:   regular  Vitals Entered By: Jennet Maduro RN (March 02, 2010 2:52 PM) CC: follow-up visit, belching followed by vomitting  and diarrhea for up to 12 hours, Depression Is Patient Diabetic? Yes Pain Assessment Patient in pain? no      Nutritional Status BMI of 19 -24 = normal Nutritional Status Detail appoetite off due to vomitting and diarrhea  Have you ever been in a relationship where you felt threatened, hurt or afraid?No   Does patient need assistance? Functional Status Self care Ambulation Normal   Physical Exam  General:  alert, well-nourished.   Head:  no change in his bilateral, symmetrical parotid hypertrophy. Mouth:  good dentition and pharynx pink and  moist.   Lungs:  normal breath sounds, no crackles, and no wheezes.   Heart:  normal rate, regular rhythm, and no murmur.   Abdomen:  soft, non-tender, normal bowel sounds, and no masses.   Skin:  no rashes.   Psych:  normally interactive, good eye contact, not anxious appearing, and not depressed appearing.      Impression & Recommendations:  Problem # 1:  HIV DISEASE (ICD-042) His adherence is excellent and his infection remains under very good control.  I will not make any changes today. Diagnostics Reviewed:  CD4: 640 (11/18/2009)   WBC: 5.4 (11/17/2009)   Hgb: 17.3 (11/17/2009)   HCT: 49.3 (11/17/2009)   Platelets: 277 (11/17/2009) HIV-1 RNA: 64 (11/17/2009)   HBSAg: No (01/02/2007)  Problem # 2:  DIABETES MELLITUS, TYPE II (ICD-250.00) I suspect that his recent gastrointestinal symptoms are related to metformin.  I did ask him to increase the dose of his proton pump inhibitor to see if that helps.  If not we may need to decrease his metformin dose if he cannot tolerate this.  I have talked to him about the importance of lifestyle modification so he can avoid needing more medications and to help avoid complications of his diabetes long term.  He is scheduled to have his hemoglobin A1c checked next month. His updated medication list for this problem includes:    Metformin Hcl 500 Mg Tabs (Metformin hcl) .Marland Kitchen..Marland Kitchen Two tablets with breakfast and two with dinner  Labs Reviewed: Creat: 0.91 (02/12/2010)    Reviewed HgBA1c results: 10.4 (12/11/2009)  Orders: Est. Patient Level IV (91478)  Problem # 3:  DYSLIPIDEMIA (ICD-272.4) His lipids are improving on his current regimen. His updated medication list for this problem includes:    Fenofibrate Micronized 200 Mg Caps (Fenofibrate micronized) .Marland Kitchen... Take 3 capsules by mouth two times a day  Orders: Est. Patient Level IV (29562)  Labs Reviewed: SGOT: 40 (11/17/2009)   SGPT: 43 (11/17/2009)   HDL:37 (02/12/2010), 42 (11/17/2009)  LDL:101  (02/12/2010), 137 (11/17/2009)  Chol:177 (02/12/2010), 222 (11/17/2009)  Trig:194 (02/12/2010), 213 (11/17/2009)  Medications Added to Medication List This Visit: 1)  Omeprazole 20 Mg Cpdr (Omeprazole) .... Take 1 capsule by mouth two times a day 2)  Claritin Reditabs 5 Mg Tbdp (Loratadine) .... Take 1 tablet by mouth once a day  Other Orders: Future Orders: T-CD4SP ([facility]) (CD4SP) ... 04/13/2010 T-HIV Viral Load [phone]) ... 04/13/2010 T-Basic Metabolic Panel [phone]) ... 04/13/2010  Patient Instructions: 1)  Please schedule a follow-up appointment in 2 months.

## 2010-12-08 NOTE — Miscellaneous (Signed)
   Allergies: 1)  ! Dilantin   Impression & Recommendations:  Problem # 1:  DIABETES MELLITUS, TYPE II (ICD-250.00)  Chris did not tolerate a retrial of lower dose metformin. He will come in tomorrow for BMP, HbA1C and C peptide testing and I will switch him to glipizide ER. His updated medication list for this problem includes:    Glipizide 5 Mg Xr24h-tab (Glipizide) .Marland Kitchen... Take 1 tablet by mouth once a day  Orders: T-Basic Metabolic Panel [phone]) T-Hgb A1C (in-house) [id number]) T- * Misc. Laboratory test [phone])  Medications Added to Medication List This Visit: 1)  Glipizide 5 Mg Xr24h-tab (Glipizide) .... Take 1 tablet by mouth once a day  Appended Document: Office Visit - Infectious Disease      Current Allergies: ! DILANTIN   Laboratory Results   Blood Tests   Date/Time Received: Mariea Clonts  Mar 26, 2010 2:21 PM  Date/Time Reported: Mariea Clonts  Mar 26, 2010 2:21 PM   HGBA1C: 7.7%   (Normal Range: Non-Diabetic - 3-6%   Control Diabetic - 6-8%)  Comments: Mariea Clonts  Mar 26, 2010 2:21 PM

## 2010-12-08 NOTE — Progress Notes (Signed)
----   Converted from flag ---- ---- 01/22/2010 8:40 AM, Jamison Neighbor RD,CDE wrote: he needs a micral this year to determine need for ACE.thanks Lupita Leash ------------------------------

## 2010-12-08 NOTE — Consult Note (Signed)
Summary: [facility]   Imported By: Florinda Marker 08/06/2010 16:49:27  _____________________________________________________________________  External Attachment:    Type:   Image     Comment:   External Document

## 2010-12-08 NOTE — Assessment & Plan Note (Signed)
Summary: F/U OV/VS   CC:  follow-up visit.  History of Present Illness: Frederick Abbott is in for his routine visit.  He is not had any problems of pain he is Atripla and does not recall missing a single dose since his last visit.  He states that he still occasionally feels anxious acutely at work when dealing with his new boss.  He states that he will take alprazolam that may be one time each week.  He also still has rare occasions when he feel slightly sad but he feels like his depression and anxiety are much better overall over the past year.  He has not seen a counselor and does not feel that that would be useful at this time.  He started on insulin under the direction of Dr. [name] and is working harder to monitor her and improve his glycemic control.  He is found the transition to insulin easier than he had imagined.  He notes that he will occasionally have a small knot come up on his coccyx which his partner Frederick Abbott will pop and it will go away very quickly.  He has not had any boils or abscesses.  Preventive Screening-Counseling & Management  Alcohol-Tobacco     Alcohol drinks/day: <1     Alcohol type: beer     Smoking Status: quit     Smoking Cessation Counseling: 05/04/2005     Year Quit: 2006     Passive Smoke Exposure: no  Caffeine-Diet-Exercise     Caffeine use/day: yes     Does Patient Exercise: yes     Type of exercise: walking     Exercise (avg: min/session): <30     Times/week: <3  Hep-HIV-STD-Contraception     HIV Risk: risk noted     HIV Risk Counseling: not indicated-no HIV risk noted  Safety-Violence-Falls     Seat Belt Use: yes  Comments: declined condoms      Sexual History:  currently monogamous.        Drug Use:  never.     Current Allergies (reviewed today): ! DILANTIN Vital Signs:  Patient profile:   40 year old male Height:      70 inches (177.80 cm) Weight:      171.5 pounds (77.95 kg) BMI:     24.70 Temp:     97.6 degrees F (36.44 degrees C)  oral Pulse rate:   80 / minute BP sitting:   144 / 89  (left arm) Cuff size:   regular  Vitals Entered By: Jennet Maduro RN (October 15, 2010 9:39 AM) CC: follow-up visit Is Patient Diabetic? Yes Pain Assessment Patient in pain? no      Nutritional Status BMI of 19 -24 = normal Nutritional Status Detail appetite "healthy"  Have you ever been in a relationship where you felt threatened, hurt or afraid?No   Does patient need assistance? Functional Status Self care Ambulation Normal Comments no missed doses of HIV rxes   Physical Exam  General:  alert, well-nourished.  there is no change in his parotid enlargement Mouth:  good dentition and pharynx pink and moist.   Lungs:  normal breath sounds, no crackles, and no wheezes.   Heart:  normal rate, regular rhythm, and no murmur.   Abdomen:  soft, non-tender, normal bowel sounds, and no distention.   Msk:  normal ROM, no joint tenderness, no joint swelling, and no joint warmth.  his left hip incision is well healed without any evidence of inflammation. Skin:  he  has a tiny pimple right over the coccyx. Psych:  normally interactive, good eye contact, not anxious appearing, and not depressed appearing.          Medication Adherence: 10/15/2010   Adherence to medications reviewed with patient. Counseling to provide adequate adherence provided            10/15/2010   Patient was screened for depression. Referal was made as indicated.                      Impression & Recommendations:  Problem # 1:  HIV DISEASE (ICD-042) His infection remains under excellent control.  I will continue his current regimen. Diagnostics Reviewed:  CD4: 780 (09/30/2010)   WBC: 8.0 (09/29/2010)   Hgb: 16.4 (09/29/2010)   HCT: 48.6 (09/29/2010)   Platelets: 325 (09/29/2010) HIV-1 RNA: <20 copies/mL (09/29/2010)   HBSAg: No (01/02/2007)  Orders: Est. Patient Level IV ([address]109)  Problem # 2:  ANXIETY (ICD-300.00) I've agreed to refill his  alprazolam but I have encouraged him to consider counseling for his chronic anxiety. His updated medication list for this problem includes:    Wellbutrin 75 Mg Tabs (Bupropion hcl) .Marland Kitchen... Take 1 tablet by mouth once a day    Alprazolam 1 Mg Tabs (Alprazolam) .Marland Kitchen... Take 1 tablet by mouth three times a day as needed  Orders: Est. Patient Level IV (60454)  Problem # 3:  DEPRESSION (ICD-311) his depression has improved over the past year her but I still would like him to consider counseling. His updated medication list for this problem includes:    Wellbutrin 75 Mg Tabs (Bupropion hcl) .Marland Kitchen... Take 1 tablet by mouth once a day    Alprazolam 1 Mg Tabs (Alprazolam) .Marland Kitchen... Take 1 tablet by mouth three times a day as needed  Orders: Est. Patient Level IV (09811)  Problem # 4:  DYSLIPIDEMIA (ICD-272.4) He is working harder on lifestyle modification.  I will repeat his lipid panel before his next visit. His updated medication list for this problem includes:    Fenofibrate Micronized 200 Mg Caps (Fenofibrate micronized) .Marland Kitchen... Take 3 capsules by mouth two times a day  Orders: Est. Patient Level IV (91478)  Labs Reviewed: SGOT: 40 (11/17/2009)   SGPT: 43 (11/17/2009)   HDL:37 (02/12/2010), 42 (11/17/2009)  LDL:101 (02/12/2010), 137 (11/17/2009)  Chol:177 (02/12/2010), 222 (11/17/2009)  Trig:194 (02/12/2010), 213 (11/17/2009)  Medications Added to Medication List This Visit: 1)  Lantus 100 Unit/ml Soln (Insulin glargine) .Marland Kitchen.. 16 units once daily  Other Orders: Future Orders: T-CD4SP ([facility]) (CD4SP) ... 04/13/2011 T-HIV Viral Load [phone]) ... 04/13/2011 T-Comprehensive Metabolic Panel [id number]) ... 04/13/2011 T-CBC w/Diff ([id number]) ... 04/13/2011 T-RPR (Syphilis) [id number]) ... 04/13/2011 T-Lipid Profile [id number]) ... 04/13/2011  Patient Instructions: 1)  Please schedule a follow-up appointment in 6 months.  Prescriptions: ALPRAZOLAM 1 MG  TABS (ALPRAZOLAM) Take 1  tablet by mouth three times a day as needed  #90 x 0   Entered and Authorized by:   Cliffton Asters MD   Signed by:   Cliffton Asters MD on 10/15/2010   Method used:   Print then Give to Patient   RxID:   [id number]    Influenza Immunization History:    Influenza # 1:  Historical (08/17/2010)

## 2010-12-08 NOTE — Consult Note (Signed)
Summary: [facility]   Imported By: Florinda Marker 09/11/2010 12:23:09  _____________________________________________________________________  External Attachment:    Type:   Image     Comment:   External Document

## 2010-12-10 NOTE — Miscellaneous (Signed)
Summary: [facility]   Clinical Lists Changes  Observations: Added new observation of PCTFPL: 313.78  (11/11/2010 14:42) Added new observation of HOUSEINCOME: 16109  (11/11/2010 14:42) Added new observation of FINASSESSDT: 11/11/2010  (11/11/2010 14:42) Added new observation of YEARLYEXPEN: 4155  (11/11/2010 14:42)

## 2010-12-10 NOTE — Consult Note (Signed)
Summary: [facility] 11/03/10  [facility] 11/03/10   Imported By: Florinda Marker 11/30/2010 10:02:09  _____________________________________________________________________  External Attachment:    Type:   Image     Comment:   External Document

## 2010-12-10 NOTE — Medication Information (Signed)
Summary: [facility]: RX  [facility]: RX   Imported By: Florinda Marker 11/23/2010 11:52:53  _____________________________________________________________________  External Attachment:    Type:   Image     Comment:   External Document

## 2010-12-15 ENCOUNTER — Encounter: Payer: Self-pay | Admitting: Internal Medicine

## 2011-01-19 LAB — T-HELPER CELL (CD4) - (RCID CLINIC ONLY)
CD4 % Helper T Cell: 28 % — ABNORMAL LOW (ref 33–55)
CD4 T Cell Abs: 780 uL (ref 400–2700)

## 2011-01-19 NOTE — Consult Note (Signed)
Summary: [facility]   Imported By: Florinda Marker 01/14/2011 16:40:36  _____________________________________________________________________  External Attachment:    Type:   Image     Comment:   External Document

## 2011-01-25 LAB — T-HELPER CELL (CD4) - (RCID CLINIC ONLY): CD4 T Cell Abs: 820 uL (ref 400–2700)

## 2011-02-01 ENCOUNTER — Telehealth: Payer: Self-pay | Admitting: *Deleted

## 2011-02-01 NOTE — Telephone Encounter (Signed)
He called back & wanted to be seen today. Told him we do not have anything today. He stated he would go elsewhere. Did not want to wait for tomorrow or other day.Elijah Birk, Esperanza Richters

## 2011-02-01 NOTE — Telephone Encounter (Signed)
He had called & LM that he thinks he has an infection in neck & jaw. Stated he had a sore throat 2 weeks ago. . I called him back & got his answering machine. LM asking him to call here & make an appt.Frederick Abbott

## 2011-02-08 ENCOUNTER — Other Ambulatory Visit (INDEPENDENT_AMBULATORY_CARE_PROVIDER_SITE_OTHER): Payer: BC Managed Care – PPO

## 2011-02-08 DIAGNOSIS — B20 Human immunodeficiency virus [HIV] disease: Secondary | ICD-10-CM

## 2011-02-08 LAB — CBC WITH DIFFERENTIAL/PLATELET
Basophils Absolute: 0 10*3/uL (ref 0.0–0.1)
Basophils Relative: 0 % (ref 0–1)
Eosinophils Relative: 2 % (ref 0–5)
HCT: 49.2 % (ref 39.0–52.0)
MCHC: 34.3 g/dL (ref 30.0–36.0)
Monocytes Absolute: 0.8 10*3/uL (ref 0.1–1.0)
Neutro Abs: 4.2 10*3/uL (ref 1.7–7.7)
Platelets: 251 10*3/uL (ref 150–400)
RDW: 13.1 % (ref 11.5–15.5)

## 2011-02-08 LAB — LIPID PANEL: Total CHOL/HDL Ratio: 5.6 Ratio

## 2011-02-08 LAB — RPR

## 2011-02-09 LAB — COMPLETE METABOLIC PANEL WITH GFR
Albumin: 4.2 g/dL (ref 3.5–5.2)
Alkaline Phosphatase: 138 U/L — ABNORMAL HIGH (ref 39–117)
BUN: 12 mg/dL (ref 6–23)
CO2: 20 mEq/L (ref 19–32)
Calcium: 9.3 mg/dL (ref 8.4–10.5)
Chloride: 105 mEq/L (ref 96–112)
GFR, Est Non African American: >60 mL/min (ref >60)
Glucose, Bld: 307 mg/dL — ABNORMAL HIGH (ref 70–99)
Potassium: 3.9 mEq/L (ref 3.5–5.3)
Sodium: 136 mEq/L (ref 135–145)
Total Protein: 6.8 g/dL (ref 6.0–8.3)

## 2011-02-09 LAB — HIV-1 RNA QUANT-NO REFLEX-BLD: HIV-1 RNA Quant, Log: <1.3 {Log} (ref <1.30)

## 2011-02-09 LAB — T-HELPER CELL (CD4) - (RCID CLINIC ONLY)
CD4 % Helper T Cell: 29 % — ABNORMAL LOW (ref 33–55)
CD4 T Cell Abs: 950 uL (ref 400–2700)

## 2011-02-12 LAB — T-HELPER CELL (CD4) - (RCID CLINIC ONLY): CD4 % Helper T Cell: 27 % — ABNORMAL LOW (ref 33–55)

## 2011-02-17 LAB — T-HELPER CELL (CD4) - (RCID CLINIC ONLY): CD4 T Cell Abs: 1090 uL (ref 400–2700)

## 2011-02-24 ENCOUNTER — Other Ambulatory Visit: Payer: Self-pay | Admitting: *Deleted

## 2011-02-24 ENCOUNTER — Encounter: Payer: Self-pay | Admitting: Internal Medicine

## 2011-02-24 ENCOUNTER — Ambulatory Visit: Payer: Self-pay | Admitting: Internal Medicine

## 2011-02-24 ENCOUNTER — Ambulatory Visit (INDEPENDENT_AMBULATORY_CARE_PROVIDER_SITE_OTHER): Payer: BC Managed Care – PPO | Admitting: Internal Medicine

## 2011-02-24 VITALS — BP 138/89 | HR 94 | Temp 97.9°F | Ht 70.0 in | Wt 172.2 lb

## 2011-02-24 DIAGNOSIS — E785 Hyperlipidemia, unspecified: Secondary | ICD-10-CM

## 2011-02-24 DIAGNOSIS — M3390 Dermatopolymyositis, unspecified, organ involvement unspecified: Secondary | ICD-10-CM

## 2011-02-24 DIAGNOSIS — F411 Generalized anxiety disorder: Secondary | ICD-10-CM

## 2011-02-24 DIAGNOSIS — Z21 Asymptomatic human immunodeficiency virus [HIV] infection status: Secondary | ICD-10-CM

## 2011-02-24 DIAGNOSIS — B20 Human immunodeficiency virus [HIV] disease: Secondary | ICD-10-CM

## 2011-02-24 DIAGNOSIS — Z131 Encounter for screening for diabetes mellitus: Secondary | ICD-10-CM

## 2011-02-24 DIAGNOSIS — F329 Major depressive disorder, single episode, unspecified: Secondary | ICD-10-CM

## 2011-02-24 DIAGNOSIS — J309 Allergic rhinitis, unspecified: Secondary | ICD-10-CM

## 2011-02-24 DIAGNOSIS — F419 Anxiety disorder, unspecified: Secondary | ICD-10-CM

## 2011-02-24 DIAGNOSIS — K219 Gastro-esophageal reflux disease without esophagitis: Secondary | ICD-10-CM

## 2011-02-24 MED ORDER — OMEPRAZOLE 20 MG PO CPDR: 20.0000 mg | DELAYED_RELEASE_CAPSULE | Freq: Two times a day (BID) | ORAL | Status: AC

## 2011-02-24 MED ORDER — EFAVIRENZ-EMTRICITAB-TENOFOVIR 600-200-300 MG PO TABS: 1.0000 | ORAL_TABLET | Freq: Every day | ORAL | Status: DC

## 2011-02-24 MED ORDER — GEMFIBROZIL 600 MG PO TABS: 600.0000 mg | ORAL_TABLET | Freq: Two times a day (BID) | ORAL | Status: DC

## 2011-02-24 MED ORDER — ALPRAZOLAM 1 MG PO TABS: 1.0000 mg | ORAL_TABLET | Freq: Three times a day (TID) | ORAL | Status: DC | PRN

## 2011-02-24 MED ORDER — BUPROPION HCL 75 MG PO TABS
75.0000 mg | ORAL_TABLET | Freq: Every day | ORAL | Status: DC
Start: 2011-02-24 — End: 2011-12-23

## 2011-02-24 MED ORDER — INSULIN GLARGINE 100 UNIT/ML ~~LOC~~ SOLN: 16.0000 [IU] | Freq: Every day | SUBCUTANEOUS | Status: DC

## 2011-02-24 MED ORDER — LORATADINE 10 MG PO TABS: 10.0000 mg | ORAL_TABLET | Freq: Every day | ORAL | Status: DC

## 2011-02-24 MED ORDER — GLIPIZIDE 10 MG PO TABS: 10.0000 mg | ORAL_TABLET | Freq: Every day | ORAL | Status: DC

## 2011-02-24 NOTE — Assessment & Plan Note (Signed)
Chris's triglycerides have increased dramatically since his last visit and it turns out that his gemfibrozil was not refill so he stopped taking it. I will refill that today.

## 2011-02-24 NOTE — Progress Notes (Signed)
  Subjective:    Patient ID: Frederick Abbott, male    DOB: April 26, 1971, 40 y.o.   MRN: [id number]  HPI [name] is in for his routine visit. He has not missed any doses of his Atripla. He recalls taking 1 dose late. He feels like he is doing better with lifestyle modification to better manage his diabetes. He denies feeling particularly anxious or depressed although he does admit to taking 1 alprazolam at bedtime. He does remain extremely frustrated and feels that work is the primary trigger. He has not seen a counselor and states that he deals with his frustration by spending some time writing and external each day.   Review of Systems     Objective:   Physical Exam  Constitutional: He appears well-developed and well-nourished.       There is no change in has prominent HIV related parotid gland hypertrophy.  HENT:  Mouth/Throat: Oropharynx is clear and moist. No oropharyngeal exudate.  Eyes: Conjunctivae are normal.  Cardiovascular: Normal rate and regular rhythm.   No murmur heard. Pulmonary/Chest: Breath sounds normal. He has no wheezes.  Abdominal: Soft. Bowel sounds are normal. There is no tenderness.  Skin: Skin is warm. No rash noted.  Psychiatric: He has a normal mood and affect.          Assessment & Plan:

## 2011-02-24 NOTE — Assessment & Plan Note (Signed)
   Frederick Abbott is infection is under excellent control. His CD4 count is well to normal range at 950 and his viral load is undetectable at less than 20. I will continue his current regimen.

## 2011-02-24 NOTE — Assessment & Plan Note (Signed)
I have agreed to refill his alprazolam.

## 2011-03-26 NOTE — Op Note (Signed)
NAMEDEFORREST, BOGLE           ACCOUNT NO.:  [id number]   MEDICAL RECORD NO.:  [id number]          PATIENT TYPE:  INP   LOCATION:  0010                         FACILITY:  [facility]   PHYSICIAN:  Ollen Gross, M.D.    DATE OF BIRTH:  10/01/1971   DATE OF PROCEDURE:  02/03/2005  DATE OF DISCHARGE:                                 OPERATIVE REPORT   PREOPERATIVE DIAGNOSIS:  Avascular necrosis, left hip.   POSTOPERATIVE DIAGNOSIS:  Avascular necrosis, left hip.   PROCEDURE:  Left total hip arthroplasty.   SURGEON:  Ollen Gross, M.D.   ASSISTANT:  Avel Peace, PA-C.   ANESTHESIA:  General.   ESTIMATED BLOOD LOSS:  300.   DRAINS:  None.   COMPLICATIONS:  None.   CONDITION:  Stable to the recovery room.   CLINICAL NOTE:  Paulmichael is a 40 year old male who has developed  significant avascular necrosis affecting both hips. He had a femoral head  collapse on both sides, worse on the left than the right.  He has  intractable pain, presenting now for left total hip arthroplasty.   PROCEDURE IN DETAIL:  After successful administration of general anesthetic,  the patient was placed in the right lateral decubitus position with the left  side up and held with a hip positioner.  The left lower extremity was  isolated from his perineum with plastic drapes and prepped and draped in the  usual sterile fashion.  A short posterolateral incision was made with a 10  blade through th subcutaneous tissue to the level of the fascia lata, which  was incised in line with the skin incision.  The sciatic nerve was palpated  and protected.  Short rotators were isolated off the femur.  A capsulectomy  was performed, and the hip was dislocated.  The center of the femoral head  is marked, and a trial prosthesis placed such that the center of the trial  head corresponds to the center of his native femoral head.  The osteotomy  line is marked on the femoral neck, and osteotomy made with an  oscillating  saw.  The femur is then retracted anteriorly to gain acetabular exposure.  Acetabular reaming begins at 47, coursing increments of 2 to a 55, and then  a 56 mm Pinnacle acetabular shell is placed in the anatomic position and  transfixed with two dome screws.  A trial 36 mm neutral liner is placed.   The femur is prepared versus the canal finder and the knee irrigated.  Axial  reaming is performed at 15.5 mm proximal with reaming to a 81F and a sleeve  machine to a large.  The 81F large trial sleeve is placed with a 20 x 15  stem and a 36+8 neck, matching his native anteversion.  The 36+0 trial head  is placed.  The hip is reduced with outstanding stability and full flexion,  extension, and rotation, 70 degrees flexion, 40 degrees adduction, 90  degrees internal rotation, and 90 degrees flexion, 70 degrees internal  rotation.  By placing the left leg on top of the right, he is about 1/8 inch  longer  on the left, which would be consistent with restarting his normal  length, given that he has collapse on the right side also.  The hip is then  dislocated, and all trials are removed.  The permanent apex hole eliminator  is placed into the acetabular shell, and the permanent 36 mm neutral  Ultramet metal liner is placed.  It is a metal-on-metal hip replacement.  Permanent 81F large sleeve is placed at the proximal femur with the 20 x 15  stem and 36+8 neck, matching his native anteversion.  A 36+0 head is placed,  and the hip is reduced to the same-stability parameters.  The wound is  copiously irrigated with saline solution, and the fascia lata is closed with  interrupted #1 Vicryl, and the subcu is closed with interrupted 2-0 Vicryl,  the subcuticular with running 4-0 Monocryl.  The incision is cleaned and  dried, then the Steri-Strips and bulky sterile dressing  applied.  He is  placed into a knee immobilizer, awakened, and transported to recovery in  stable condition.       FA/MEDQ  D:  02/03/2005  T:  02/03/2005  Job:  [id number]

## 2011-03-26 NOTE — H&P (Signed)
NAMEMELFORD, TULLIER           ACCOUNT NO.:  [id number]   MEDICAL RECORD NO.:  [id number]          PATIENT TYPE:  INP   LOCATION:  NA                           [facility]ACILITY[facility]   PHYSICIAN:  Ollen Gross, M.D.    DATE OF BIRTH:  August 16, 1971   DATE OF ADMISSION:  DATE OF DISCHARGE:                                HISTORY & PHYSICAL   CHIEF COMPLAINT:  Bilateral hip pain, left greater than right.   HISTORY OF PRESENT ILLNESS:  The patient is a 40 year old male approximately  four to five-month history of progressive hip pain. The left is more  symptomatic than the right. He has been seen by Dr. Maxwell Caul back  in November. He had a MRI scan which showed avascular necrosis involving 75%  of both femoral head, currently the left is more symptomatic than the right.  He was referred over and is seen by Dr. Ollen Gross. X-rays were reviewed  and MRIs were reviewed and it was felt he had stage 3 osteonecrosis on the  left hip involving about 85% and he also showed some collapse on the femoral  head on plain films involvement of about 75-80% on the opposite side. It is  felt due to his high-grade AVM that it would be the most predictable means  of improving his flexion and pain would be a total hip arthroplasty. Risks  and benefits have been discussed. The patient has elected to proceed with  surgery.   ALLERGIES:  DILANTIN.   CURRENT MEDICATIONS:  1.  Truvada 1 q.d.  2.  Kaletra due b.i.d.  3.  Percocet 10 p.r.n.  4.  Protonix p.r.n.   PAST MEDICAL HISTORY:  1.  History of epilepsy with his last seizure being in 69 at age 67.  2.  Episodic depression.  3.  History of alcohol use and abuse.  4.  History of drug use and abuse.  5.  Reflux disease.  6.  History of hepatitis.  7.  History of abdominal abscess with MRSA infection, status post I&D      February of 2006.  8.  Human immunodeficiency virus (HIV).   PAST SURGICAL HISTORY:  1.  Tonsillectomy in 1973.  2.   Recent abdominal I&D of abscess December 21, 2004.   SOCIAL HISTORY:  He is single but lives with his domestic partner. Therapist, nutritional. Denies use of cigarettes; however, he does occasionally smoke  marijuana. Alcohol intake is approximately a beer a month.   FAMILY HISTORY:  History of lung cancer, diabetes, and arthritis.   REVIEW OF SYMPTOMS:  GENERAL:  No fever, chills, night sweats. NEUROLOGICAL:  Past history of seizures. Last one was at age 85. No syncope or paralysis.  RESPIRATORY:  No shortness of breath, productive cough, or hemoptysis.  CARDIOVASCULAR:  No chest pain, angina, or orthopnea. GI:  He does give a  history of hepatitis and reflux. No recent nausea or vomiting. No blood or  mucus in the stool.  GU:  Has had a history of urinary tract infections with  some urinary frequency. No dysuria or discharge. MUSCULOSKELETAL:  Pertinent  of the hips found in the history of present illness.   PHYSICAL EXAMINATION:  VITAL SIGNS:  Pulse 84, respiratory rate 12, blood  pressure 112/64.  GENERAL:  This is a 40 year old white male, thin, mildly cachetic-appearing  male. He is thin framed. Well-developed. Alert, oriented, cooperative, and  very pleasant.  HEENT:  Normocephalic and atraumatic. Pupils are equal, round, and reactive.  Oropharynx is clear. EOMs are intact.  NECK:  Supple.  CHEST:  Clear on anterior and posterior chest. No rhonchi, rubs, or  wheezing.  HEART:  Regular rate and rhythm.  ABDOMEN:  Soft, flat, and nontender. Bowel sounds are present.  RECTAL:  Not done, not pertinent to present illness.  BREASTS:  Not done, not pertinent to present illness.  GENITALIA:  Not done, not pertinent to present illness.  EXTREMITIES:  Left hip shows flexion 90, internal rotation of 5, external  rotation of 20, abduction 20. Right hip shows flexion 95, internal rotation  20, external rotation 30, abduction 30. He does ambulate with a slightly  antalgic gait.   IMPRESSION:   1.  Bilateral hip avascular necrosis, left more symptomatic than the right.  2.  History of seizures/epilepsy.  3.  History of episodic depression.  4.  History of alcohol use/abuse.  5.  History of drug use/abuse.  6.  Reflux disease.  7.  History of hepatitis.  8.  History of skin infection, abdominal abscess/methicillin-resistant      Staphylococcus positive with recent incision and drainage.  9.  Past history of urinary tract infections.  10. Human immunodeficiency virus (HIV).   PLAN:  The patient will be admitted to [facility] to undergo a  left total hip arthroplasty. Surgery will be performed by Dr. Ollen Gross.      ALP/MEDQ  D:  02/02/2005  T:  02/03/2005  Job:  [id number]   cc:   Ollen Gross, M.D.  [facility]  [address]  [address]  [place]  [place] [address]  Fax: [phone]   Cliffton Asters, M.D.  [address] [place]  [place] [address]  Fax: [phone]

## 2011-03-26 NOTE — Op Note (Signed)
NAMETHATCHER, DOBERSTEIN           ACCOUNT NO.:  [id number]   MEDICAL RECORD NO.:  [id number]          PATIENT TYPE:  INP   LOCATION:  0010                         [facility]ACILITY[facility]   PHYSICIAN:  Ollen Gross, M.D.    DATE OF BIRTH:  04-25-1971   DATE OF PROCEDURE:  08/09/2005  DATE OF DISCHARGE:                                 OPERATIVE REPORT   PREOPERATIVE DIAGNOSIS:  Avascular necrosis, right hip.   POSTOPERATIVE DIAGNOSIS:  Avascular necrosis, right hip.   PROCEDURE:  Right total hip arthroplasty.   SURGEON:  Ollen Gross, M.D.   ASSISTANT:  Avel Peace, PA-C.   ANESTHESIA:  General.   ESTIMATED BLOOD LOSS:  200 cc.   DRAINS:  Hemovac x1.   COMPLICATIONS:  None.   CONDITION:  Stable to the recovery room.   CLINICAL NOTE:  Lenwood is a 40 year old male who has severe high-grade  osteonecrosis of the right hip with intractable pain.  He has had a previous  successful left total hip arthroplasty and presents now for right total hip  arthroplasty.   PROCEDURE IN DETAIL:  After successful administration of general anesthetic,  the patient was placed in the left lateral decubitus position with the right  side up and held with a hip positioner.  The right lower extremity was  isolated from his perineum with plastic drapes and prepped and draped in the  usual sterile fashion.  A short posterolateral incision was made with a 10  blade through the subcutaneous tissue to the level of the fascia lata, which  was incised in line with the skin incision.  The sciatic nerve was palpated  and protected.  Short rotators were isolated off the femur.  A capsulectomy  was performed, and the hip was dislocated.  The center of the femoral head  was marked, and the trial prosthesis was placed such that the center of the  trial prosthesis corresponded with the center of his native femoral head.  The osteotomy line was marked on the femoral neck, and an osteotomy was made  with an  oscillating saw.  The femoral head was removed, and then the femur  retracted anteriorly to gain acetabular exposure.  The labrum is removed.  There were no osteophytes noted.  Reaming began at 47 mm, coursing  increments of 2 to 55 mm and a 56 mm Pinnacle acetabular shell is placed in  an anatomic position and transfixed with two dome screws.  A trial 36 mm  neutral liner is placed.   The femur is prepared with the canal finder and irrigation. Axial rimming is  performed up to 15.5 mm.  Proximal reaming took 64F and the sleeve machine  to a large.  A 64F large trial sleeve is placed with the 20 x 15 stem, 36+8  neck, matching his native anteversion.  A 36+0 trial head is placed.  The  hip is reduced with great stability.  Full extension, full external  rotation, 70 degrees flexion, 40 degrees adduction, 90 degrees internal  rotation, and 90 degrees of flexion, 70 degrees of internal rotation.  By  placing the right leg  on top of the left, leg lengths were found to be  equal.  All trials were removed.  The apex hole eliminator was placed into  the acetabular shell.  The permanent 36 mm neutral Ultramet metal liner is  placed.  This is a metal-on-metal hip replacement.  The 37F large sleeve is  placed, and a 20 x 15 stem and a 36+8  neck, matching his native  anteversion.  A 36+0 head is placed.  The hip is reduced to same-stability  parameters.  The wound is copiously irrigated with saline solution.  The  short rotators are reattached to the femur through drill holes.  The fascia  lata is closed with #1 Vicryl and the subcu closed with 2-0 Vicryl and  subcuticular running 4-0 Monocryl.  The incision is clean and dried.  Steri-  Strips and bulky sterile dressing applied.  The knee immobilizer is placed.  He is awakened and transported to the recovery room in stable condition.      Ollen Gross, M.D.  Electronically Signed     FA/MEDQ  D:  08/09/2005  T:  08/09/2005  Job:  [id number]

## 2011-03-26 NOTE — Discharge Summary (Signed)
NAMEJERAY, Abbott           ACCOUNT NO.:  [id number]   MEDICAL RECORD NO.:  [id number]          PATIENT TYPE:  INP   LOCATION:  1519                         FACILITY:  [facility]   PHYSICIAN:  Ollen Gross, M.D.    DATE OF BIRTH:  06-24-1971   DATE OF ADMISSION:  08/09/2005  DATE OF DISCHARGE:  08/13/2005                                 DISCHARGE SUMMARY   ADMISSION DIAGNOSES:  1.  AVN right hip.  2.  History of seizure with epilepsy.  3.  History of episodic depression.  4.  History of alcohol use/abuse.  5.  History of drug use/abuse.  6.  Reflux disease.  7.  History of hepatitis.  8.  History of skin infections.  9.  Abdominal abscess, methicillin resistant staph aureus, with recent      incision and drainage.  10. Past history of urinary tract infection.  11. Human immunodeficiency virus.   DISCHARGE DIAGNOSES:  1.  AVN right hip status post right total hip arthroplasty.  2.  History of seizure with epilepsy.  3.  History of episodic depression.  4.  History of alcohol use/abuse.  5.  History of drug use/abuse.  6.  Reflux disease.  7.  History of hepatitis.  8.  History of skin infections.  9.  Abdominal abscess, methicillin resistant staph aureus, with recent      incision and drainage.  10. Past history of urinary tract infection.  11. Human immunodeficiency virus.  12. Postoperative hyponatremia, improving.   PROCEDURE:  August 09, 2005, right total hip.   SURGEON:  Ollen Gross, M.D.   ASSISTANT:  Patrica Duel, P.A.C.   ANESTHESIA:  General.   BLOOD LOSS:  200 cc.   DRAINS:  Hemovac x1.   HISTORY OF PRESENT ILLNESS:  Frederick Abbott is a 40 year old male with severe  high grade avascular necrosis of the right hip with intractable pain.  Previous successful left total hip, now presents for right total hip.   CONSULTATIONS:  None.   LABORATORY DATA:  CBC preoperative hemoglobin 17.2, hematocrit of 49.3.  Differential elevated monocytes 12,  elevated eosinophils 7, otherwise  differential within normal limits. Postoperative hemoglobin 14.6. Last known  hemoglobin and hematocrit 13.9 and 39.8. PT and PTT preoperative 13.2 and  43, respectively. Serial pro-times follows with PT/INR of 22.8 and 2.0. Chem  panel on admission all within normal limits. Serial B-mets are followed.  Sodium dropped from 137, to 131, back up to 133. Remaining B-mets within  normal limits. Urinalysis preoperative negative. Blood group type B  positive.   Portable hip and pelvis film on August 09, 2005 - anatomic alignment, status  post right hip arthroplasty without acute complicating features.   HOSPITAL COURSE:  Admitted to [facility]. Tolerated  procedure well. Transported to recovery room then  to orthopedic floor.  Started on PCA and p.o. analgesics. Was a little sore on day 1 but overall,  doing better. Was able to tell a difference in the pain already. The pain  was under fairly good control. Started with his therapy. No drain was  placed. Fluids were KVO'd. Did  have a little it of nausea. Given IV anti-  emetics. By day 2, the nausea and vomiting had continued. He had a little  bit more nausea on the morning of day 2 as opposed to day 1. Dressing was  changed and incision looked good. PCA was discontinued. Continued his IV  fluids, anti-emetics. He had some urinary retention, since his Foley came  out. Tried voiding trial. In and out catheter was ordered. From a therapy  standpoint, the patient was doing fairly well. He was ambulating  approximately 90 feet on the morning of day 2. Did get up on day 3 and  ambulated approximately 60 feet. It was some concern whether he may be able  to go home on day 3. I wanted to see how he did. He had a little bit of an  elevated temperature on day 2. Was checked and he remained afebrile.  Continued with therapy. His nausea was improving. Held his discharge on day  3 for continued mobility.  However, by day 4, the patient was doing much  better. He was afebrile. Tolerating his medications. The nausea had resolved  and he was discharged home.   PLAN:  1.  The patient discharged to home on August 13, 2005.  2.  Discharge diagnoses, please see above.  3.  Discharge medications:  Percocet, OxyContin, and Coumadin.  4.  Diet as tolerated.  5.  Activity partial weight bearing 25 to 50% right lower extremity.  6.  Followup in 2 weeks.  7.  Daily dressing change.  8.  May start showering.   DISPOSITION:  To home.   CONDITION ON DISCHARGE:  Improving.      Frederick Abbott, P.A.      Ollen Gross, M.D.  Electronically Signed    ALP/MEDQ  D:  10/02/2005  T:  10/02/2005  Job:  [id number]   cc:   Cliffton Asters, M.D.  Fax: [phone]   Ollen Gross, M.D.  Fax: [phone]

## 2011-03-26 NOTE — H&P (Signed)
NAMEEMAAD, NANNA           ACCOUNT NO.:  [id number]   MEDICAL RECORD NO.:  [id number]          PATIENT TYPE:  INP   LOCATION:  NA                           FACILITY:  [facility]   PHYSICIAN:  Ollen Gross, M.D.    DATE OF BIRTH:  [birth year]   DATE OF ADMISSION:  08/09/2005  DATE OF DISCHARGE:                                HISTORY & PHYSICAL   DATE OF OFFICE VISIT HISTORY AND PHYSICAL:  August 06, 2005.   DATE OF ADMISSION:  August 09, 2005   CHIEF COMPLAINT:  Right hip pain.   HISTORY OF PRESENT ILLNESS:  The patient is a 40 year old male well known to  Dr. Ollen Gross having previously undergone a left total hip replacement  arthroplasty back in March of this year. He was found to have then avascular  necrosis of both hips. He has done extremely well with the left hip and the  right hip has continued to progressively get worse over the past six months.  It was felt he would benefit from undergoing surgical intervention. The  risks and benefits were discussed. The patient was subsequently admitted to  the hospital.   ALLERGIES:  DILANTIN.   CURRENT MEDICATIONS:  1.  Kaletra 2 tablets twice daily.  2.  Percocet 10/650 p.r.n.  3.  OxyContin 20 mg q. 12.  4.  Protonix p.r.n.   PAST MEDICAL HISTORY:  1.  History of epilepsy with his last seizure being in 2 at the age 38.  2.  Episodic depression.  3.  History of alcohol use abuse.  4.  History of drug use abuse.  5.  Reflux disease.  6.  History of hepatitis.  7.  History of abdominal abscess with MRSA infection status post I&D      February of 2006.  8.  Human immunodeficiency virus (HIV).   PAST SURGICAL HISTORY:  1.  Tonsillectomy in 1973.  2.  Recent abdominal I&D abscess, February 2006.  3.  Left total hip arthroplasty in March of 2006.   SOCIAL HISTORY:  Single, lives with his domestic partner, currently  unemployed, denies use of cigarettes, however, he does occasionally smoke  marijuana. Alcohol  intake approximately a beer a month.   FAMILY HISTORY:  Significant for lung cancer, diabetes and arthritis.   REVIEW OF SYMPTOMS:  GENERAL:  No fever, chills or night sweats. NEUROLOGIC:  No seizure, syncope, paralysis. RESPIRATORY:  No shortness of breath,  productive cough or hemoptysis. CARDIOVASCULAR:  No chest pain, angina,  orthopnea. GI:  No nausea, vomiting, diarrhea or constipation. GU:  No  dysuria, hematuria or discharge. MUSCULOSKELETAL:  Right hip found in the  history of present illness.   PHYSICAL EXAMINATION:  VITAL SIGNS:  Pulse 64, respiratory rate 12, blood  pressure 114/70.  GENERAL:  A 40 year old, white male, well-nourished, well-developed, thin  frame, no acute distress. He is alert, oriented, cooperative and pleasant,  appears to be a good historian.  HEENT:  Normocephalic, atraumatic. Pupils are round and reactive. Oropharynx  clear. EOMs intact.  CHEST:  Clear.  HEART:  Regular rate and rhythm, no murmur, S1, S2 noted.  ABDOMEN:  Soft, flat, nontender. Bowel sounds present.  RECTAL/BREASTS/GENITALIA:  Not done not pertinent to present illness.  EXTREMITIES:  Right hip:  Right hip flexion shows about 90 degrees, 0  degrees internal rotation, 0 degrees external rotation, abduction of about  20.   IMPRESSION:  1.  Avascular necrosis, right hip.  2.  History of seizure with epilepsy.  3.  History of episodic depression.  4.  History of alcohol use abuse.  5.  History of drug use abuse.  6.  Reflux disease.  7.  History of hepatitis.  8.  History of skin infection.  9.  Abdominal abscess methicillin resistant staph aureus with recent I&D.  10. Past history of urinary tract infection.  11. Human immunodeficiency virus (HIV).   PLAN:  The patient admitted to [facility] to undergo right total  hip arthroplasty. The surgery will be performed by Dr. Ollen Gross.      Alexzandrew L. Julien Girt, P.A.      Ollen Gross, M.D.  Electronically  Signed    ALP/MEDQ  D:  08/08/2005  T:  08/09/2005  Job:  [id number]   cc:   Cliffton Asters, M.D.  Fax: [phone]

## 2011-03-26 NOTE — Discharge Summary (Signed)
Frederick Abbott, Frederick Abbott           ACCOUNT NO.:  [id number]   MEDICAL RECORD NO.:  [id number]          PATIENT TYPE:  INP   LOCATION:  0480                         FACILITY:  [facility]   PHYSICIAN:  Ollen Gross, M.D.    DATE OF BIRTH:  May 05, 1971   DATE OF ADMISSION:  02/03/2005  DATE OF DISCHARGE:  02/07/2005                                 DISCHARGE SUMMARY   ADMISSION DIAGNOSES:  1.  Bilateral hips avascular necrosis, left more symptomatic than right.  2.  History of seizures/epilepsy.  3.  History of episodic depression.  4.  History of alcohol use/abuse.  5.  History of drug use/abuse.  6.  Reflux disease.  7.  History of hepatitis.  8.  History of skin infection abdominal abscess/methicillin resistant      staphylococcus aureus positive with recent I&D.  9.  Past history of urinary tract infection.  10. Human immunodeficiency virus.   DISCHARGE DIAGNOSES:  1.  Avascular necrosis left hip status post left total hip replacement      arthroplasty.  2.  History of seizures/epilepsy.  3.  History of episodic depression.  4.  History of alcohol use/abuse.  5.  History of drug use/abuse.  6.  Reflux disease.  7.  History of hepatitis.  8.  History of skin infection abdominal abscess/methicillin resistant      staphylococcus aureus positive with recent I&D.  9.  Past history of urinary tract infection.  10. Human immunodeficiency virus.   PROCEDURE:  February 03, 2005 left total hip arthroplasty, surgeon Ollen Gross, M.D., assistant Avel Peace, P.A.-C., anesthesia general, blood  loss 300 mL, hemovac drain none.   BRIEF HISTORY:  Frederick Abbott is a 40 year old male whose developed  significant AVN affecting both hips. He had a femoral head collapse on both  sides worse on the left than the right, has intractable pain, now presents  for total hip arthroplasty.   CONSULTATIONS:  Infectious disease, Cliffton Asters, M.D.   LABORATORY DATA:  Preop CBC showed a hemoglobin of  16.1, hematocrit of 46.8,  white cell count 6.9, red cell count 5.54. Low neutrophils of 40, lymphs 46,  monos 10, eos 3, basos 0.  Postop hemoglobin 14.9, last noted H&H 15.0 and  43.7. PT and PTT preop 12.5 and 29 respectively with an INR of 0.9, serial  pro times followed. Last noted PT/INR 20.5 and 2.3.  Chem panel on  admission, low sodium 134, glucose 119, remaining chem panel within normal  limits. Serial BMETs are followed. Sodium came up to 136 back down to 132.  Remaining BMETs remained within normal limits with the exception glucose  went up from 119 to 137.  Preop UA negative, followup UA hazy. Positive pro  time, few bacteria otherwise negative. Blood group type B positive.  Urine  culture no growth.   Left hip films January 29, 2005, subchondral fracture, flattening of the left  femoral head with associated AVN, portable left hip and pelvis. No apparent  complication post left hip replacement.   HOSPITAL COURSE:  Admitted to [facility], taken to the OR,  underwent the above  stated procedure without complications. The patient  tolerated the procedure well and was later transferred to the recovery room  and then the orthopedic floor. The patient was placed on PC and p.o.  analgesics postop. On day one, the patient was mostly describing lateral  pain, encouraged p.o. and PCA medications, weaned over to p.o. medications.  He was placed on contact precautions with the most recent MRSA infection and  recent I&D.  His labs looked very good. There was no drain placed at the  time of surgery. The wound was nice and dry at the time of closure.  Initiated his therapy. Dr. Cliffton Asters is the patient's medical physician,  he did see him postoperatively. He did check his abdominal abscess incision  with the MRSA which was well healed. He did note that his viral load was  essentially undetectable, less than 400. He started to get up with physical  therapy. He actually did very  well with PT and was up ambulating  approximately 50 feet and later 100 feet on day two. Continued to progress  well. By day three, he was up ambulating approximately 120 feet. His pain  continued to improve. He did have a little bit of nausea the first couple of  days but that improved by day three. He did spike a temperature of 102,  urine was checked.  Did have elevation in his white count up to 16.2. Dr.  [name] was also evaluating the patient and did not see any obvious source  of fever. He continued to progress well. By day four, the patient was  resting comfortably, fever had resolved, he was doing well, wound was  healing well. Dr. [name] agreed with his discharge and he was discharged  home.   PLAN:  1.  The patient was discharged home on February 07, 2005.  2.  Discharge diagnoses please see above.  3.  Discharge medications:  Vicodin, Robaxin, Coumadin.  4.  Diet, assume previous home diet.  5.  Followup two weeks from surgery, call the office for an appointment.  6.  Activity, 50% partial weightbearing to the left lower extremity.      Continue gait training, ambulation, ADL's, home health PT and home      health nursing, may start showering. Daily dressing changes.   DISPOSITION:  Home.   CONDITION ON DISCHARGE:  Improved.     ALP/MEDQ  D:  02/16/2005  T:  02/16/2005  Job:  [id number]   cc:   Cliffton Asters, M.D.  [address] [place]  [place] [address]  Fax: [phone]

## 2011-04-01 ENCOUNTER — Other Ambulatory Visit: Payer: Self-pay | Admitting: *Deleted

## 2011-04-01 DIAGNOSIS — E785 Hyperlipidemia, unspecified: Secondary | ICD-10-CM

## 2011-04-01 MED ORDER — GEMFIBROZIL 600 MG PO TABS: 600.0000 mg | ORAL_TABLET | Freq: Two times a day (BID) | ORAL | Status: DC

## 2011-07-26 ENCOUNTER — Other Ambulatory Visit: Payer: Self-pay | Admitting: *Deleted

## 2011-07-26 DIAGNOSIS — Z131 Encounter for screening for diabetes mellitus: Secondary | ICD-10-CM

## 2011-07-26 MED ORDER — GLUCOSE BLOOD VI STRP: ORAL_STRIP | Status: DC

## 2011-07-28 ENCOUNTER — Other Ambulatory Visit: Payer: Self-pay | Admitting: *Deleted

## 2011-07-28 DIAGNOSIS — Z131 Encounter for screening for diabetes mellitus: Secondary | ICD-10-CM

## 2011-07-28 MED ORDER — GLUCOSE BLOOD VI STRP: ORAL_STRIP | Status: DC

## 2011-08-09 LAB — T-HELPER CELL (CD4) - (RCID CLINIC ONLY): CD4 % Helper T Cell: 29 — ABNORMAL LOW

## 2011-08-10 ENCOUNTER — Telehealth: Payer: Self-pay | Admitting: *Deleted

## 2011-08-10 DIAGNOSIS — Z131 Encounter for screening for diabetes mellitus: Secondary | ICD-10-CM

## 2011-08-10 MED ORDER — GLUCOSE BLOOD VI STRP: ORAL_STRIP | Status: DC

## 2011-08-10 NOTE — Telephone Encounter (Signed)
Pt requested enough glucose test strips to cover a month of testing.  = 300 test strips.  New rx sent to pharmacy.  Jennet Maduro, RN

## 2011-08-24 LAB — T-HELPER CELL (CD4) - (RCID CLINIC ONLY)
CD4 % Helper T Cell: 25 — ABNORMAL LOW
CD4 T Cell Abs: 660

## 2011-09-10 ENCOUNTER — Other Ambulatory Visit: Payer: Self-pay | Admitting: Internal Medicine

## 2011-09-10 ENCOUNTER — Other Ambulatory Visit (INDEPENDENT_AMBULATORY_CARE_PROVIDER_SITE_OTHER): Payer: BC Managed Care – PPO

## 2011-09-10 DIAGNOSIS — B20 Human immunodeficiency virus [HIV] disease: Secondary | ICD-10-CM

## 2011-09-10 DIAGNOSIS — E785 Hyperlipidemia, unspecified: Secondary | ICD-10-CM

## 2011-09-10 LAB — T-HELPER CELL (CD4) - (RCID CLINIC ONLY)
CD4 % Helper T Cell: 30 % — ABNORMAL LOW (ref 33–55)
CD4 T Cell Abs: 950 uL (ref 400–2700)

## 2011-09-10 LAB — LIPID PANEL: Cholesterol: 191 mg/dL (ref 0–200)

## 2011-09-14 LAB — HIV-1 RNA QUANT-NO REFLEX-BLD

## 2011-09-23 ENCOUNTER — Ambulatory Visit (INDEPENDENT_AMBULATORY_CARE_PROVIDER_SITE_OTHER): Payer: BC Managed Care – PPO | Admitting: Internal Medicine

## 2011-09-23 ENCOUNTER — Encounter: Payer: Self-pay | Admitting: Internal Medicine

## 2011-09-23 VITALS — Ht 70.0 in | Wt 175.2 lb

## 2011-09-23 DIAGNOSIS — M79609 Pain in unspecified limb: Secondary | ICD-10-CM

## 2011-09-23 DIAGNOSIS — B20 Human immunodeficiency virus [HIV] disease: Secondary | ICD-10-CM

## 2011-09-23 DIAGNOSIS — Z23 Encounter for immunization: Secondary | ICD-10-CM

## 2011-09-23 DIAGNOSIS — M79673 Pain in unspecified foot: Secondary | ICD-10-CM | POA: Insufficient documentation

## 2011-09-23 DIAGNOSIS — E785 Hyperlipidemia, unspecified: Secondary | ICD-10-CM

## 2011-09-23 NOTE — Progress Notes (Signed)
  Subjective:    Patient ID: Frederick Abbott, male    DOB: 08-16-1971, 40 y.o.   MRN: [id number]  HPI Gaylyn Rong is in for his routine visit. He denies missing any doses of his Atripla. He is been working hard on his diet and his hemoglobin A1c has come down to less than 6. His only current problems are been some intermittent dull aching pain in his left heel and mild intermittent pain just below his umbilicus. Both have been occurring for about the last 2 months but occur at different times. She had no injury prior to onset of either of these pains. The left heel pain is probably a little more noticeable during the day when he is up walking. The abdominal pain is very fleeting and can occur at any time of day.    Review of Systems     Objective:   Physical Exam  Constitutional: He appears well-developed and well-nourished. No distress.  Cardiovascular: Normal rate, regular rhythm and normal heart sounds.   Pulmonary/Chest: Breath sounds normal. He has no wheezes. He has no rales.  Abdominal: Soft. Bowel sounds are normal. He exhibits no distension. There is no tenderness.  Musculoskeletal:       Examination of his left foot is completely normal. I cannot elicit the pain with palpation of his heel. His foot is warm and well perfused and his pulses are intact. He has normal sensation.  Psychiatric: He has a normal mood and affect.   HIV 1 RNA Quant (copies/mL)  Date Value  09/10/2011 Comment: HIV 1 RNA not detected.   02/08/2011 <20   09/29/2010 <20 copies/mL      CD4 T Cell Abs (cmm)  Date Value  09/10/2011 950   02/08/2011 950   09/29/2010 780             Assessment & Plan:

## 2011-09-23 NOTE — Assessment & Plan Note (Signed)
I am not sure why he is having left heel pain. At this point I think it is best simply to manage it symptomatically and see what happens. He is in agreement with that plan.

## 2011-09-23 NOTE — Assessment & Plan Note (Signed)
His triglyceride has improved significantly since restarting gemfibrozil.

## 2011-09-23 NOTE — Assessment & Plan Note (Signed)
His infection remains under excellent control. I will continue Atripla.

## 2011-12-23 ENCOUNTER — Other Ambulatory Visit: Payer: Self-pay | Admitting: *Deleted

## 2011-12-23 DIAGNOSIS — B20 Human immunodeficiency virus [HIV] disease: Secondary | ICD-10-CM

## 2011-12-23 DIAGNOSIS — F419 Anxiety disorder, unspecified: Secondary | ICD-10-CM

## 2011-12-23 DIAGNOSIS — F329 Major depressive disorder, single episode, unspecified: Secondary | ICD-10-CM

## 2011-12-23 MED ORDER — BUPROPION HCL 75 MG PO TABS: 75.0000 mg | ORAL_TABLET | Freq: Every day | ORAL | Status: DC

## 2011-12-23 MED ORDER — ALPRAZOLAM 1 MG PO TABS: 1.0000 mg | ORAL_TABLET | Freq: Three times a day (TID) | ORAL | Status: DC | PRN

## 2011-12-23 MED ORDER — EFAVIRENZ-EMTRICITAB-TENOFOVIR 600-200-300 MG PO TABS: 1.0000 | ORAL_TABLET | Freq: Every day | ORAL | Status: DC

## 2012-03-14 ENCOUNTER — Other Ambulatory Visit: Payer: Self-pay | Admitting: Licensed Clinical Social Worker

## 2012-03-14 DIAGNOSIS — E785 Hyperlipidemia, unspecified: Secondary | ICD-10-CM

## 2012-03-14 MED ORDER — GEMFIBROZIL 600 MG PO TABS: 600.0000 mg | ORAL_TABLET | Freq: Two times a day (BID) | ORAL | Status: DC

## 2012-03-15 ENCOUNTER — Other Ambulatory Visit: Payer: BC Managed Care – PPO

## 2012-03-16 ENCOUNTER — Other Ambulatory Visit: Payer: BC Managed Care – PPO

## 2012-03-16 ENCOUNTER — Other Ambulatory Visit: Payer: Self-pay | Admitting: *Deleted

## 2012-03-16 DIAGNOSIS — B20 Human immunodeficiency virus [HIV] disease: Secondary | ICD-10-CM

## 2012-03-16 LAB — CBC
HCT: 45.9 % (ref 39.0–52.0)
Hemoglobin: 15.5 g/dL (ref 13.0–17.0)
WBC: 8.8 10*3/uL (ref 4.0–10.5)

## 2012-03-16 LAB — LIPID PANEL
Cholesterol: 190 mg/dL (ref 0–200)
HDL: 31 mg/dL — ABNORMAL LOW (ref >39)
Triglycerides: 403 mg/dL — ABNORMAL HIGH (ref <150)

## 2012-03-16 LAB — COMPREHENSIVE METABOLIC PANEL
Albumin: 4.7 g/dL (ref 3.5–5.2)
BUN: 18 mg/dL (ref 6–23)
Calcium: 9.6 mg/dL (ref 8.4–10.5)
Chloride: 104 mEq/L (ref 96–112)
Glucose, Bld: 285 mg/dL — ABNORMAL HIGH (ref 70–99)
Potassium: 4.3 mEq/L (ref 3.5–5.3)

## 2012-03-16 MED ORDER — EFAVIRENZ-EMTRICITAB-TENOFOVIR 600-200-300 MG PO TABS: 1.0000 | ORAL_TABLET | Freq: Every day | ORAL | Status: DC

## 2012-03-17 LAB — HIV-1 RNA QUANT-NO REFLEX-BLD: HIV-1 RNA Quant, Log: <1.3 {Log} (ref <1.30)

## 2012-03-30 ENCOUNTER — Encounter: Payer: Self-pay | Admitting: Internal Medicine

## 2012-03-30 ENCOUNTER — Ambulatory Visit (INDEPENDENT_AMBULATORY_CARE_PROVIDER_SITE_OTHER): Payer: BC Managed Care – PPO | Admitting: Internal Medicine

## 2012-03-30 VITALS — BP 142/95 | HR 88 | Temp 97.7°F | Ht 70.0 in | Wt 175.0 lb

## 2012-03-30 DIAGNOSIS — B20 Human immunodeficiency virus [HIV] disease: Secondary | ICD-10-CM

## 2012-03-30 NOTE — Progress Notes (Signed)
Patient ID: BARTOSZ LUGINBILL, male   DOB: 07-15-1971, 41 y.o.   MRN: [id number]     [facility] Infectious Disease  Patient Active Problem List  Diagnoses  . HIV DISEASE  . VENEREAL WART  . DIABETES MELLITUS, TYPE II  . DYSLIPIDEMIA  . ANXIETY  . DEPRESSION  . DECREASED HEARING, BILATERAL  . ALLERGIC RHINITIS  . GERD  . CARBUNCLE/FURUNCLE NOS  . PSORIASIS NEC  . LOW BACK PAIN, CHRONIC  . NECROSIS, ASEPTIC, FEMUR HEAD/NECK  . SEIZURE DISORDER  . HEPATITIS B, HX OF  . Heel pain    Patient's Medications  New Prescriptions   No medications on file  Previous Medications   ALPRAZOLAM (XANAX) 1 MG TABLET    Take 1 tablet (1 mg total) by mouth 3 (three) times daily as needed for sleep.   ASPIRIN 325 MG TABLET    Take 325 mg by mouth daily.   BAYER MICROLET LANCETS LANCETS       BUPROPION (WELLBUTRIN) 75 MG TABLET    Take 1 tablet (75 mg total) by mouth daily.   EFAVIRENZ-EMTRICTABINE-TENOFOVIR (ATRIPLA) 600-200-300 MG PER TABLET    Take 1 tablet by mouth at bedtime.   GEMFIBROZIL (LOPID) 600 MG TABLET    Take 1 tablet (600 mg total) by mouth 2 (two) times daily.   GLIPIZIDE (GLUCOTROL) 10 MG 24 HR TABLET    Take 10 mg by mouth daily.    GLUCOSE BLOOD (BAYER CONTOUR TEST) TEST STRIP    Use as directed.   INSULIN GLARGINE (LANTUS) 100 UNIT/ML INJECTION    Inject 16 Units into the skin daily.   JANUVIA 100 MG TABLET       LORATADINE (CLARITIN) 10 MG TABLET    Take 1 tablet (10 mg total) by mouth daily.   LORATADINE (CLARITIN) 10 MG TABLET    Take 10 mg by mouth daily.   RANITIDINE (ZANTAC) 150 MG TABLET    Take 150 mg by mouth daily.  Modified Medications   No medications on file  Discontinued Medications   No medications on file    Subjective: Frederick Abbott is in for his routine visit. He denies missing a single dose of his Atripla since his last visit. He states that he is under a great deal of stress at work and also because his mother just had hiatal hernia surgery. He is not  getting any regular exercise. He states that his blood sugars generally range from about 90-150 a day. He believes his last hemoglobin A1c was around 6.5.  He continues to have intermittent low abdominal pain that he cannot recreate. It has not changed and is not anything that affects his ability to work or do his other activities. He does not have any nausea, vomiting, constipation, diarrhea or dysuria.  Objective: Temp: 97.7 F (36.5 C) (05/23 0934) Temp src: Oral (05/23 0934) BP: 142/95 mmHg (05/23 0934) Pulse Rate: 88  (05/23 0934)  General: He is in good spirits Skin: No rash Lungs: Clear Cor: Regular S1 and S2 no murmurs Abdomen: Slightly protuberant and firm do to lipodystrophy and some central adiposity. His abdomen is nontender. No inguinal hernias are noted.  Lab Results HIV 1 RNA Quant (copies/mL)  Date Value  03/16/2012 <20   09/10/2011 Comment: HIV 1 RNA not detected.   02/08/2011 <20      CD4 T Cell Abs (cmm)  Date Value  03/16/2012 930   09/10/2011 950   02/08/2011 950  Assessment: His HIV infection remains under excellent control. I will continue Atripla.  His primary problem is metabolic syndrome with his diabetes, hypertriglyceridemia and today his blood pressure is elevated. I talked to him at length about the need to get regular exercise and he will probably need to change his gemfibrozil to fenofibrate. However I suggested that he discuss ongoing primary care needs with Dr. [name] at the time of his followup visit next month. I believe he needs a dedicated primary care physician to oversee his non-HIV therapy.  Plan: 1. Continue Atripla 2. Discuss establishing primary care with [facility] primary care physicians 3. Followup in 6 months   Cliffton Asters, MD [facility] [phone] pager   [phone] cell 03/30/2012, 10:10 AM

## 2012-05-06 ENCOUNTER — Emergency Department (HOSPITAL_COMMUNITY): Payer: BC Managed Care – PPO

## 2012-05-06 ENCOUNTER — Encounter (HOSPITAL_COMMUNITY): Payer: Self-pay | Admitting: *Deleted

## 2012-05-06 ENCOUNTER — Emergency Department (HOSPITAL_COMMUNITY)
Admission: EM | Admit: 2012-05-06 | Discharge: 2012-05-06 | Disposition: A | Discharge status: Home / Self Care | Payer: BC Managed Care – PPO | Attending: Emergency Medicine | Admitting: Emergency Medicine

## 2012-05-06 DIAGNOSIS — R1011 Right upper quadrant pain: Secondary | ICD-10-CM | POA: Insufficient documentation

## 2012-05-06 DIAGNOSIS — Z79899 Other long term (current) drug therapy: Secondary | ICD-10-CM | POA: Insufficient documentation

## 2012-05-06 DIAGNOSIS — M549 Dorsalgia, unspecified: Secondary | ICD-10-CM | POA: Insufficient documentation

## 2012-05-06 DIAGNOSIS — R109 Unspecified abdominal pain: Secondary | ICD-10-CM

## 2012-05-06 DIAGNOSIS — E7800 Pure hypercholesterolemia, unspecified: Secondary | ICD-10-CM | POA: Insufficient documentation

## 2012-05-06 DIAGNOSIS — E119 Type 2 diabetes mellitus without complications: Secondary | ICD-10-CM | POA: Insufficient documentation

## 2012-05-06 DIAGNOSIS — Z21 Asymptomatic human immunodeficiency virus [HIV] infection status: Secondary | ICD-10-CM | POA: Insufficient documentation

## 2012-05-06 HISTORY — DX: Pure hypercholesterolemia, unspecified: E78.00

## 2012-05-06 HISTORY — DX: Asymptomatic human immunodeficiency virus (hiv) infection status: Z21

## 2012-05-06 HISTORY — DX: Human immunodeficiency virus (HIV) disease: B20

## 2012-05-06 LAB — URINALYSIS, ROUTINE W REFLEX MICROSCOPIC
Glucose, UA: 500 mg/dL — AB
Ketones, ur: NEGATIVE mg/dL
Leukocytes, UA: NEGATIVE
Nitrite: NEGATIVE
Protein, ur: NEGATIVE mg/dL
pH: 6.5 (ref 5.0–8.0)

## 2012-05-06 LAB — CBC WITH DIFFERENTIAL/PLATELET
Basophils Absolute: 0 10*3/uL (ref 0.0–0.1)
Basophils Relative: 0 % (ref 0–1)
Eosinophils Absolute: 0.3 10*3/uL (ref 0.0–0.7)
Eosinophils Relative: 5 % (ref 0–5)
HCT: 43.2 % (ref 39.0–52.0)
Hemoglobin: 15.1 g/dL (ref 13.0–17.0)
Lymphocytes Relative: 40 % (ref 12–46)
Lymphs Abs: 2.8 10*3/uL (ref 0.7–4.0)
MCH: 30.4 pg (ref 26.0–34.0)
MCHC: 35 g/dL (ref 30.0–36.0)
MCV: 87.1 fL (ref 78.0–100.0)
Monocytes Absolute: 0.6 10*3/uL (ref 0.1–1.0)
Monocytes Relative: 9 % (ref 3–12)
Neutro Abs: 3.3 10*3/uL (ref 1.7–7.7)
Neutrophils Relative %: 46 % (ref 43–77)
Platelets: 256 10*3/uL (ref 150–400)
RBC: 4.96 MIL/uL (ref 4.22–5.81)
RDW: 12.3 % (ref 11.5–15.5)
WBC: 7.1 10*3/uL (ref 4.0–10.5)

## 2012-05-06 LAB — POCT I-STAT, CHEM 8
BUN: 15 mg/dL (ref 6–23)
Calcium, Ion: 1.15 mmol/L (ref 1.12–1.32)
HCT: 43 % (ref 39.0–52.0)
Sodium: 142 mEq/L (ref 135–145)
TCO2: 20 mmol/L (ref 0–100)

## 2012-05-06 LAB — HEPATIC FUNCTION PANEL
Bilirubin, Direct: <0.1 mg/dL (ref 0.0–0.3)
Total Bilirubin: 0.2 mg/dL — ABNORMAL LOW (ref 0.3–1.2)

## 2012-05-06 MED ORDER — GLYCOPYRROLATE 2 MG PO TABS: 2.0000 mg | ORAL_TABLET | Freq: Three times a day (TID) | ORAL | Status: DC

## 2012-05-06 NOTE — ED Notes (Signed)
C/o diffuse R  abd pain, also R upper back pain behind shoulder blade, "takes my breath away", onset 2300. (Denies: nv, fever), last ate 2100, last BM 2330 (diarrhea/watery).

## 2012-05-06 NOTE — ED Provider Notes (Addendum)
History     CSN: [id number]  Arrival date & time 05/06/12  0005   First MD Initiated Contact with Patient 05/06/12 0401      Chief Complaint  Patient presents with  . Abdominal Pain  . Back Pain    UPPER R BACK BEHIND SHOULDER BLADE     (Consider location/radiation/quality/duration/timing/severity/associated sxs/prior treatment) Patient is a 41 y.o. male presenting with abdominal pain. The history is provided by the patient.  Abdominal Pain The primary symptoms of the illness include abdominal pain. The primary symptoms of the illness do not include fever, shortness of breath, nausea, vomiting, diarrhea or dysuria.  Additional symptoms associated with the illness include back pain. Symptoms associated with the illness do not include chills or hematuria.   the patient is a 41 year old HIV-positive male, who takes atripla , and had recent CD4 count greater than 100.  He presents emergency department complaining of postprandial abdominal pain.  That radiated to his right shoulder.  He denies nausea, vomiting, fevers, chills, cough, or shortness of breath.  He has not had this before.  He denies prior history of abdominal surgery.  Resume.  He is asymptomatic.  Past Medical History  Diagnosis Date  . Diabetes mellitus   . HIV (human immunodeficiency virus infection)   . Hypercholesterolemia     Past Surgical History  Procedure Date  . Joint replacement     No family history on file.  History  Substance Use Topics  . Smoking status: Former Smoker -- 0.8 packs/day for 20 years    Types: Cigarettes    Quit date: 05/06/2006  . Smokeless tobacco: Never Used  . Alcohol Use: 0.5 oz/week    1 drink(s) per week     rare      Review of Systems  Constitutional: Negative for fever and chills.  HENT: Negative for congestion.   Respiratory: Negative for cough and shortness of breath.   Cardiovascular: Negative for chest pain.  Gastrointestinal: Positive for abdominal pain.  Negative for nausea, vomiting and diarrhea.  Genitourinary: Negative for dysuria and hematuria.  Musculoskeletal: Positive for back pain.  Skin: Negative for rash.  Neurological: Negative for headaches.  Psychiatric/Behavioral: Negative for confusion.  All other systems reviewed and are negative.    Allergies  Metformin and related and Phenytoin  Home Medications   Current Outpatient Rx  Name Route Sig Dispense Refill  . ALPRAZOLAM 1 MG PO TABS Oral Take 1 mg by mouth daily.    . ASPIRIN 325 MG PO TABS Oral Take 325 mg by mouth daily.    . BUPROPION HCL 75 MG PO TABS Oral Take 1 tablet (75 mg total) by mouth daily. 90 tablet 1  . EFAVIRENZ-EMTRICITAB-TENOFOVIR 600-200-300 MG PO TABS Oral Take 1 tablet by mouth at bedtime. 90 tablet 1  . GEMFIBROZIL 600 MG PO TABS Oral Take 1 tablet (600 mg total) by mouth 2 (two) times daily. 180 tablet 3  . GLIPIZIDE ER 10 MG PO TB24 Oral Take 10 mg by mouth daily.     . INSULIN GLARGINE 100 UNIT/ML [place] SOLN Subcutaneous Inject 50 Units into the skin every evening.    Marland Kitchen LORATADINE 10 MG PO TABS Oral Take 10 mg by mouth daily.    Marland Kitchen RANITIDINE HCL 150 MG PO TABS Oral Take 150 mg by mouth daily.    Marland Kitchen SITAGLIPTIN PHOSPHATE 100 MG PO TABS Oral Take 100 mg by mouth at bedtime.    Marland Kitchen BAYER MICROLET LANCETS MISC      .  GLUCOSE BLOOD VI STRP  Use as directed. 300 each prn  . LORATADINE 10 MG PO TABS Oral Take 1 tablet (10 mg total) by mouth daily. 30 tablet 2    BP 165/99  Pulse 82  Temp 97.1 F (36.2 C) (Oral)  Resp 20  SpO2 100%  Physical Exam  Nursing note and vitals reviewed. Constitutional: He is oriented to person, place, and time. He appears well-developed and well-nourished.  HENT:  Head: Normocephalic and atraumatic.  Eyes: Conjunctivae are normal.  Neck: Normal range of motion. Neck supple.  Cardiovascular: Normal rate.   No murmur heard. Pulmonary/Chest: Effort normal. He has no wheezes. He has no rales.  Abdominal: Soft. He exhibits  no distension. There is tenderness. There is no rebound.       Right upper quadrant tenderness, with guarding, and positive Murphy's sign  Musculoskeletal: Normal range of motion. He exhibits no edema.  Neurological: He is alert and oriented to person, place, and time.  Skin: Skin is warm and dry.  Psychiatric: He has a normal mood and affect. Thought content normal.    ED Course  Procedures (including critical care time) 41 year old, male, HIV positive without AIDS.  Presents emergency department with an episode of postprandial right upper quadrant pain, radiating to his right shoulder blade.  No history of abdominal surgery.  Symptoms resolved.  Now.  He does have abdominal tenderness, with guarding, and Murphy sign.  We will perform laboratory testing, and ultrasound, for evaluation.  Labs Reviewed  HEPATIC FUNCTION PANEL - Abnormal; Notable for the following:    Total Bilirubin 0.2 (*)     All other components within normal limits  URINALYSIS, ROUTINE W REFLEX MICROSCOPIC - Abnormal; Notable for the following:    Glucose, UA 500 (*)     All other components within normal limits  POCT I-STAT, CHEM 8 - Abnormal; Notable for the following:    Glucose, Bld 210 (*)     All other components within normal limits  LIPASE, BLOOD  CBC WITH DIFFERENTIAL   Dg Chest 2 View  05/06/2012  *RADIOLOGY REPORT*  Clinical Data: Right chest and flank pain, shortness of breath, diarrhea  CHEST - 2 VIEW  Comparison: 06/14/2005  Findings: Normal heart size, mediastinal contours, and pulmonary vascularity. Lungs clear. No pleural effusion or pneumothorax. No acute osseous findings. Old healed fracture posterior right seventh rib.  IMPRESSION: No acute abnormalities.  Original Report Authenticated By: Lollie Marrow, M.D.     No diagnosis found.  Pain still gone Explained US findings and plan. Pt understands and agrees.  MDM  Abdominal pain Gall stone v tumefactive sludge.  No cholecystitis. No acute  abdomen        Cheri Guppy, MD 05/06/12 4098  Cheri Guppy, MD 05/06/12 [phone]

## 2012-05-06 NOTE — Discharge Instructions (Signed)
Your blood tests do not show any significant illness.  Your ultrasound shows a gallstone or sludge.  Use Robinul for recurrent pain.  Followup with the general surgeon.  Next week.  Call for appointment time.  Return for worse or uncontrolled symptoms

## 2012-05-09 ENCOUNTER — Encounter (INDEPENDENT_AMBULATORY_CARE_PROVIDER_SITE_OTHER): Payer: Self-pay | Admitting: Surgery

## 2012-05-09 ENCOUNTER — Ambulatory Visit (INDEPENDENT_AMBULATORY_CARE_PROVIDER_SITE_OTHER): Payer: BC Managed Care – PPO | Admitting: Surgery

## 2012-05-09 VITALS — BP 146/92 | HR 90 | Temp 98.9°F | Ht 70.0 in | Wt 174.8 lb

## 2012-05-09 DIAGNOSIS — K8010 Calculus of gallbladder with chronic cholecystitis without obstruction: Secondary | ICD-10-CM | POA: Insufficient documentation

## 2012-05-09 NOTE — Progress Notes (Signed)
Patient ID: Frederick Abbott, male   DOB: 11/08/1970, 41 y.o.   MRN: [id number]  Chief Complaint  Patient presents with  . Pre-op Exam    eval gallbladder with stones    HPI Frederick Abbott is a 41 y.o. male.  Referred by Dr. Caporossi for evaluation of gallbladder symptoms  ID - Dr. John Campbell Endo - Dr. Kerr HPI 41-year-old male who [facility] for severe right upper quadrant abdominal pain associated with diarrhea, nausea, radiation of pain to his back and shoulder. He was evaluated in the ED.  Liver function tests and lipase were normal. Ultrasound showed small stones and sludge within the gallbladder. No sign of wall thickening. Common bile duct was normal. In retrospect, the patient reports intermittent symptoms of the last couple of years of abdominal bloating, belching, abdominal pain, nausea, vomiting, and diarrhea. This most recent attack was the worst. It lasted for a few hours. Past Medical History  Diagnosis Date  . Diabetes mellitus   . HIV (human immunodeficiency virus infection)   . Hypercholesterolemia   . Seizures     Past Surgical History  Procedure Date  . Joint replacement   . Tonsillectomy 1973    Family History  Problem Relation Age of Onset  . Cancer Mother     breast  . Cancer Maternal Grandmother     melanoma-nose  . Cancer Paternal Grandfather     melanoma-ear    Social History History  Substance Use Topics  . Smoking status: Former Smoker -- 0.8 packs/day for 20 years    Types: Cigarettes    Quit date: 05/06/2006  . Smokeless tobacco: Never Used  . Alcohol Use: 0.5 oz/week    1 drink(s) per week     1-2 drinks every week    Allergies  Allergen Reactions  . Metformin And Related Nausea And Vomiting  . Phenytoin     Current Outpatient Prescriptions  Medication Sig Dispense Refill  . ALPRAZolam (XANAX) 1 MG tablet Take 1 mg by mouth daily.      . aspirin 325 MG tablet Take 325 mg by mouth daily.        . BAYER MICROLET LANCETS lancets       . buPROPion (WELLBUTRIN) 75 MG tablet Take 1 tablet (75 mg total) by mouth daily.  90 tablet  1  . efavirenz-emtrictabine-tenofovir (ATRIPLA) 600-200-300 MG per tablet Take 1 tablet by mouth at bedtime.  90 tablet  1  . gemfibrozil (LOPID) 600 MG tablet Take 1 tablet (600 mg total) by mouth 2 (two) times daily.  180 tablet  3  . glipiZIDE (GLUCOTROL) 10 MG 24 hr tablet Take 10 mg by mouth daily.       . glucose blood (BAYER CONTOUR TEST) test strip Use as directed.  300 each  prn  . glycopyrrolate (ROBINUL-FORTE) 2 MG tablet Take 1 tablet (2 mg total) by mouth 3 (three) times daily.  60 tablet  0  . Insulin Glargine (LANTUS [place]) Inject 44 Units as directed.      . loratadine (CLARITIN) 10 MG tablet Take 10 mg by mouth daily.      . ranitidine (ZANTAC) 150 MG tablet Take 150 mg by mouth daily.      . sitaGLIPtin (JANUVIA) 100 MG tablet Take 100 mg by mouth at bedtime.      . loratadine (CLARITIN) 10 MG tablet Take 1 tablet (10 mg total) by mouth daily.  30 tablet  2      Review of Systems Review of Systems  Constitutional: Negative for fever, chills and unexpected weight change.  HENT: Negative for hearing loss, congestion, sore throat, trouble swallowing and voice change.   Eyes: Negative for visual disturbance.  Respiratory: Negative for cough and wheezing.   Cardiovascular: Negative for chest pain, palpitations and leg swelling.  Gastrointestinal: Positive for nausea, vomiting, abdominal pain, diarrhea and abdominal distention. Negative for constipation, blood in stool, anal bleeding and rectal pain.  Genitourinary: Negative for hematuria and difficulty urinating.  Musculoskeletal: Negative for arthralgias.  Skin: Negative for rash and wound.  Neurological: Negative for seizures, syncope, weakness and headaches.  Hematological: Negative for adenopathy. Does not bruise/bleed easily.  Psychiatric/Behavioral: Negative for confusion.    Blood pressure  146/92, pulse 90, temperature 98.9 F (37.2 C), temperature source Temporal, height 5' 10" (1.778 m), weight 174 lb 12.8 oz (79.289 kg), SpO2 98.00%.  Physical Exam Physical Exam WDWN in NAD HEENT:  EOMI, sclera anicteric Neck:  No masses, no thyromegaly Lungs:  CTA bilaterally; normal respiratory effort CV:  Regular rate and rhythm; no murmurs Abd:  +bowel sounds, soft, non-tender, upper rectus diastasis; small umbilical hernia;  Ext:  Well-perfused; no edema Skin:  Warm, dry; no sign of jaundice  Data Reviewed Ultrasound/ labs   Assessment    Chronic cholecystitis    Plan    Laparoscopic cholecystectomy with IOC. The surgical procedure has been discussed with the patient.  Potential risks, benefits, alternative treatments, and expected outcomes have been explained.  All of the patient's questions at this time have been answered.  The likelihood of reaching the patient's treatment goal is good.  The patient understand the proposed surgical procedure and wishes to proceed.        Man Bonneau K. 05/09/2012, 11:43 AM    

## 2012-05-09 NOTE — Patient Instructions (Addendum)
Hold your aspirin 5 days prior to surgery.

## 2012-05-12 ENCOUNTER — Telehealth (INDEPENDENT_AMBULATORY_CARE_PROVIDER_SITE_OTHER): Payer: Self-pay

## 2012-05-12 NOTE — Telephone Encounter (Signed)
Patient was finally able to get in touch with me. I told him to eat bland diet, no fatty/greasy foods, dairy products. Told him to pay attention to what he eats to see what triggers attacks. Patient understands.

## 2012-05-12 NOTE — Telephone Encounter (Signed)
Patient called in saying he had another gallbladder attack from something he ate and wondered if there was something he could take to help stop attack. As I was getting ready to give him a diet to follow to help prevent attacks I was disconnected. I tried calling him back a couple times at work and got disconnected each time.

## 2012-05-17 ENCOUNTER — Encounter (HOSPITAL_COMMUNITY): Payer: Self-pay | Admitting: Pharmacy Technician

## 2012-05-19 ENCOUNTER — Encounter (INDEPENDENT_AMBULATORY_CARE_PROVIDER_SITE_OTHER): Payer: Self-pay

## 2012-05-19 ENCOUNTER — Telehealth (INDEPENDENT_AMBULATORY_CARE_PROVIDER_SITE_OTHER): Payer: Self-pay

## 2012-05-19 NOTE — Telephone Encounter (Signed)
I called the pt to ask if I needed to put a cover sheet to anyone's attention and he said no.  I faxed a letter.

## 2012-05-19 NOTE — Telephone Encounter (Signed)
Message copied by Ivory Broad on Fri May 19, 2012  3:41 PM ------      Message from: Zacarias Pontes      Created: Fri May 19, 2012  9:55 AM       Pt needs a note faxed to his job stating that he will be out of work,   est time til the 30th of this month..please fax to [phone]

## 2012-05-24 ENCOUNTER — Encounter (HOSPITAL_COMMUNITY): Payer: Self-pay

## 2012-05-24 ENCOUNTER — Encounter (HOSPITAL_COMMUNITY)
Admission: RE | Admit: 2012-05-24 | Discharge: 2012-05-24 | Disposition: A | Discharge status: Home / Self Care | Payer: BC Managed Care – PPO | Source: Ambulatory Visit | Attending: Surgery | Admitting: Surgery

## 2012-05-24 HISTORY — DX: Major depressive disorder, single episode, unspecified: F32.9

## 2012-05-24 HISTORY — DX: Depression, unspecified: F32.A

## 2012-05-24 HISTORY — DX: Gastro-esophageal reflux disease without esophagitis: K21.9

## 2012-05-24 HISTORY — DX: Polyneuropathy, unspecified: G62.9

## 2012-05-24 HISTORY — DX: Chronic cholecystitis: K81.1

## 2012-05-24 LAB — CBC
MCV: 87.5 fL (ref 78.0–100.0)
Platelets: 324 10*3/uL (ref 150–400)
RBC: 5.29 MIL/uL (ref 4.22–5.81)
RDW: 12.2 % (ref 11.5–15.5)
WBC: 6.4 10*3/uL (ref 4.0–10.5)

## 2012-05-24 LAB — COMPREHENSIVE METABOLIC PANEL
ALT: 44 U/L (ref 0–53)
AST: 43 U/L — ABNORMAL HIGH (ref 0–37)
Albumin: 4.4 g/dL (ref 3.5–5.2)
Alkaline Phosphatase: 105 U/L (ref 39–117)
CO2: 24 mEq/L (ref 19–32)
Chloride: 102 mEq/L (ref 96–112)
GFR calc non Af Amer: >90 mL/min (ref >90)
Potassium: 4.2 mEq/L (ref 3.5–5.1)
Sodium: 137 mEq/L (ref 135–145)
Total Bilirubin: 0.4 mg/dL (ref 0.3–1.2)

## 2012-05-24 LAB — SURGICAL PCR SCREEN: Staphylococcus aureus: NEGATIVE

## 2012-05-24 NOTE — Patient Instructions (Addendum)
20 VERDIS KOVAL  05/24/2012   Your procedure is scheduled on:  05/29/12 at 11:15 am  Report to SHORT STAY DEPT  at 8:45 AM.  Call this number if you have problems the morning of surgery: [phone]   Remember:   Do not eat food or drink liquids AFTER MIDNIGHT    Take these medicines the morning of surgery with A SIP OF WATER: GEMFIBRIZOLE / CLARITIN / RANITIDINE / ALPRAZOLAM / ROBINUL-FORTE   Do not wear jewelry, make-up or nail polish.  Do not wear lotions, powders, or perfumes.   Do not shave legs or underarms 48 hrs. before surgery (men may shave face)  Do not bring valuables to the hospital.  Contacts, dentures or bridgework may not be worn into surgery.  Leave suitcase in the car. After surgery it may be brought to your room.  For patients admitted to the hospital, checkout time is 11:00 AM the day of discharge.   Patients discharged the day of surgery will not be allowed to drive home.    Special Instructions:   Please read over the following fact sheets that you were given: MRSA  Information               SHOWER WITH BETASEPT THE NIGHT BEFORE SURGERY AND THE MORNING OF SURGERY

## 2012-05-24 NOTE — Pre-Procedure Instructions (Signed)
UA faxed to Dr. Aluisio - confirmation recieved 

## 2012-05-29 ENCOUNTER — Encounter (HOSPITAL_COMMUNITY): Payer: Self-pay | Admitting: *Deleted

## 2012-05-29 ENCOUNTER — Ambulatory Visit (HOSPITAL_COMMUNITY)
Admission: RE | Admit: 2012-05-29 | Discharge: 2012-05-29 | Disposition: A | Discharge status: Home / Self Care | Payer: BC Managed Care – PPO | Source: Ambulatory Visit | Attending: Surgery | Admitting: Surgery

## 2012-05-29 ENCOUNTER — Encounter (HOSPITAL_COMMUNITY)
Admission: RE | Disposition: A | Discharge status: Home / Self Care | Payer: Self-pay | Source: Ambulatory Visit | Attending: Surgery

## 2012-05-29 ENCOUNTER — Ambulatory Visit (HOSPITAL_COMMUNITY): Payer: BC Managed Care – PPO | Admitting: *Deleted

## 2012-05-29 ENCOUNTER — Ambulatory Visit (HOSPITAL_COMMUNITY): Payer: BC Managed Care – PPO

## 2012-05-29 DIAGNOSIS — K8010 Calculus of gallbladder with chronic cholecystitis without obstruction: Secondary | ICD-10-CM

## 2012-05-29 DIAGNOSIS — K819 Cholecystitis, unspecified: Secondary | ICD-10-CM | POA: Insufficient documentation

## 2012-05-29 DIAGNOSIS — Z01812 Encounter for preprocedural laboratory examination: Secondary | ICD-10-CM | POA: Insufficient documentation

## 2012-05-29 DIAGNOSIS — E7800 Pure hypercholesterolemia, unspecified: Secondary | ICD-10-CM | POA: Insufficient documentation

## 2012-05-29 DIAGNOSIS — E119 Type 2 diabetes mellitus without complications: Secondary | ICD-10-CM | POA: Insufficient documentation

## 2012-05-29 DIAGNOSIS — K811 Chronic cholecystitis: Secondary | ICD-10-CM

## 2012-05-29 DIAGNOSIS — Z21 Asymptomatic human immunodeficiency virus [HIV] infection status: Secondary | ICD-10-CM | POA: Insufficient documentation

## 2012-05-29 DIAGNOSIS — Z79899 Other long term (current) drug therapy: Secondary | ICD-10-CM | POA: Insufficient documentation

## 2012-05-29 HISTORY — PX: CHOLECYSTECTOMY: SHX55

## 2012-05-29 SURGERY — LAPAROSCOPIC CHOLECYSTECTOMY WITH INTRAOPERATIVE CHOLANGIOGRAM
Anesthesia: General | Site: Abdomen | Wound class: Clean Contaminated

## 2012-05-29 MED ORDER — 0.9 % SODIUM CHLORIDE (POUR BTL) OPTIME
TOPICAL | Status: DC | PRN
  Administered 2012-05-29: 1000 mL

## 2012-05-29 MED ORDER — ROCURONIUM BROMIDE 100 MG/10ML IV SOLN
INTRAVENOUS | Status: DC | PRN
  Administered 2012-05-29: 15 mg via INTRAVENOUS
  Administered 2012-05-29: 45 mg via INTRAVENOUS
  Administered 2012-05-29: 10 mg via INTRAVENOUS

## 2012-05-29 MED ORDER — HYDROMORPHONE HCL PF 1 MG/ML IJ SOLN
INTRAMUSCULAR | Status: AC
  Filled 2012-05-29: qty 1

## 2012-05-29 MED ORDER — FENTANYL CITRATE 0.05 MG/ML IJ SOLN
INTRAMUSCULAR | Status: DC | PRN
  Administered 2012-05-29 (×2): 100 ug via INTRAVENOUS
  Administered 2012-05-29 (×5): 50 ug via INTRAVENOUS

## 2012-05-29 MED ORDER — OXYCODONE-ACETAMINOPHEN 5-325 MG PO TABS: 1.0000 | ORAL_TABLET | ORAL | Status: AC | PRN

## 2012-05-29 MED ORDER — LACTATED RINGERS IV SOLN
INTRAVENOUS | Status: DC | PRN
  Administered 2012-05-29 (×2): via INTRAVENOUS

## 2012-05-29 MED ORDER — HYDROMORPHONE HCL PF 1 MG/ML IJ SOLN
0.2500 mg | INTRAMUSCULAR | Status: DC | PRN
  Administered 2012-05-29 (×4): 0.5 mg via INTRAVENOUS

## 2012-05-29 MED ORDER — LACTATED RINGERS IV SOLN
INTRAVENOUS | Status: DC
  Administered 2012-05-29: 1000 mL via INTRAVENOUS

## 2012-05-29 MED ORDER — PROPOFOL 10 MG/ML IV BOLUS
INTRAVENOUS | Status: DC | PRN
  Administered 2012-05-29: 200 mg via INTRAVENOUS

## 2012-05-29 MED ORDER — ONDANSETRON HCL 4 MG/2ML IJ SOLN: 4.0000 mg | INTRAMUSCULAR | Status: DC | PRN

## 2012-05-29 MED ORDER — BUPIVACAINE-EPINEPHRINE 0.25% -1:200000 IJ SOLN
INTRAMUSCULAR | Status: DC | PRN
  Administered 2012-05-29: 20 mL

## 2012-05-29 MED ORDER — KETOROLAC TROMETHAMINE 30 MG/ML IJ SOLN
INTRAMUSCULAR | Status: DC | PRN
  Administered 2012-05-29: 30 mg via INTRAVENOUS

## 2012-05-29 MED ORDER — OXYCODONE-ACETAMINOPHEN 5-325 MG PO TABS: 1.0000 | ORAL_TABLET | Freq: Once | ORAL | Status: DC

## 2012-05-29 MED ORDER — IOHEXOL 300 MG/ML  SOLN
INTRAMUSCULAR | Status: AC
  Filled 2012-05-29: qty 1

## 2012-05-29 MED ORDER — GLYCOPYRROLATE 0.2 MG/ML IJ SOLN
INTRAMUSCULAR | Status: DC | PRN
  Administered 2012-05-29: .6 mg via INTRAVENOUS

## 2012-05-29 MED ORDER — ONDANSETRON HCL 4 MG/2ML IJ SOLN
INTRAMUSCULAR | Status: DC | PRN
  Administered 2012-05-29: 4 mg via INTRAVENOUS

## 2012-05-29 MED ORDER — CEFAZOLIN SODIUM-DEXTROSE 2-3 GM-% IV SOLR
INTRAVENOUS | Status: AC
  Filled 2012-05-29: qty 50

## 2012-05-29 MED ORDER — NEOSTIGMINE METHYLSULFATE 1 MG/ML IJ SOLN
INTRAMUSCULAR | Status: DC | PRN
  Administered 2012-05-29: 4 mg via INTRAVENOUS

## 2012-05-29 MED ORDER — OXYCODONE-ACETAMINOPHEN 5-325 MG PO TABS
1.0000 | ORAL_TABLET | ORAL | Status: DC | PRN
  Administered 2012-05-29: 1 via ORAL

## 2012-05-29 MED ORDER — OXYCODONE-ACETAMINOPHEN 5-325 MG PO TABS
ORAL_TABLET | ORAL | Status: AC
  Filled 2012-05-29: qty 1

## 2012-05-29 MED ORDER — MIDAZOLAM HCL 5 MG/5ML IJ SOLN
INTRAMUSCULAR | Status: DC | PRN
  Administered 2012-05-29: 2 mg via INTRAVENOUS

## 2012-05-29 MED ORDER — LACTATED RINGERS IV SOLN
INTRAVENOUS | Status: DC
  Administered 2012-05-29: 15:00:00 via INTRAVENOUS

## 2012-05-29 MED ORDER — CEFAZOLIN SODIUM-DEXTROSE 2-3 GM-% IV SOLR
2.0000 g | INTRAVENOUS | Status: AC
  Administered 2012-05-29: 2 g via INTRAVENOUS

## 2012-05-29 MED ORDER — SODIUM CHLORIDE 0.9 % IV SOLN
INTRAVENOUS | Status: DC | PRN
  Administered 2012-05-29: 13:00:00

## 2012-05-29 MED ORDER — BUPIVACAINE-EPINEPHRINE PF 0.25-1:200000 % IJ SOLN
INTRAMUSCULAR | Status: AC
  Filled 2012-05-29: qty 30

## 2012-05-29 MED ORDER — LACTATED RINGERS IR SOLN
Status: DC | PRN
  Administered 2012-05-29: 1000 mL

## 2012-05-29 MED ORDER — LIDOCAINE HCL (CARDIAC) 20 MG/ML IV SOLN
INTRAVENOUS | Status: DC | PRN
  Administered 2012-05-29: 50 mg via INTRAVENOUS

## 2012-05-29 MED ORDER — PROMETHAZINE HCL 25 MG/ML IJ SOLN: 6.2500 mg | INTRAMUSCULAR | Status: DC | PRN

## 2012-05-29 MED ORDER — MORPHINE SULFATE 10 MG/ML IJ SOLN: 2.0000 mg | INTRAMUSCULAR | Status: DC | PRN

## 2012-05-29 SURGICAL SUPPLY — 42 items
APPLIER CLIP ROT 10 11.4 M/L (STAPLE) ×2
BENZOIN TINCTURE PRP APPL 2/3 (GAUZE/BANDAGES/DRESSINGS) ×2 IMPLANT
CANISTER SUCTION 2500CC (MISCELLANEOUS) ×2 IMPLANT
CHLORAPREP W/TINT 26ML (MISCELLANEOUS) ×2 IMPLANT
CLIP APPLIE ROT 10 11.4 M/L (STAPLE) ×1 IMPLANT
CLOTH BEACON ORANGE TIMEOUT ST (SAFETY) ×2 IMPLANT
COVER MAYO STAND STRL (DRAPES) ×2 IMPLANT
DECANTER SPIKE VIAL GLASS SM (MISCELLANEOUS) IMPLANT
DRAPE C-ARM 42X72 X-RAY (DRAPES) ×2 IMPLANT
DRAPE LAPAROSCOPIC ABDOMINAL (DRAPES) ×2 IMPLANT
DRAPE UTILITY XL STRL (DRAPES) ×2 IMPLANT
DRSG TEGADERM 2-3/8X2-3/4 SM (GAUZE/BANDAGES/DRESSINGS) ×6 IMPLANT
DRSG TEGADERM 4X4.75 (GAUZE/BANDAGES/DRESSINGS) ×2 IMPLANT
ELECT CAUTERY BLADE 6.4 (BLADE) ×2 IMPLANT
ELECT REM PT RETURN 9FT ADLT (ELECTROSURGICAL) ×2
ELECTRODE REM PT RTRN 9FT ADLT (ELECTROSURGICAL) ×1 IMPLANT
FILTER SMOKE EVAC LAPAROSHD (FILTER) ×2 IMPLANT
GLOVE BIO SURGEON STRL SZ7 (GLOVE) ×2 IMPLANT
GLOVE BIOGEL PI IND STRL 7.0 (GLOVE) ×1 IMPLANT
GLOVE BIOGEL PI IND STRL 7.5 (GLOVE) ×1 IMPLANT
GLOVE BIOGEL PI INDICATOR 7.0 (GLOVE) ×1
GLOVE BIOGEL PI INDICATOR 7.5 (GLOVE) ×1
GOWN STRL NON-REIN LRG LVL3 (GOWN DISPOSABLE) ×4 IMPLANT
GOWN STRL REIN XL XLG (GOWN DISPOSABLE) ×2 IMPLANT
KIT BASIN OR (CUSTOM PROCEDURE TRAY) ×2 IMPLANT
NS IRRIG 1000ML POUR BTL (IV SOLUTION) ×2 IMPLANT
PENCIL BUTTON HOLSTER BLD 10FT (ELECTRODE) ×2 IMPLANT
POUCH SPECIMEN RETRIEVAL 10MM (ENDOMECHANICALS) ×2 IMPLANT
RINGERS IRRIG 1000ML POUR BTL (IV SOLUTION) ×2 IMPLANT
SET CHOLANGIOGRAPH MIX (MISCELLANEOUS) ×2 IMPLANT
SET IRRIG TUBING LAPAROSCOPIC (IRRIGATION / IRRIGATOR) ×2 IMPLANT
SOLUTION ANTI FOG 6CC (MISCELLANEOUS) ×2 IMPLANT
SPONGE GAUZE 4X4 12PLY (GAUZE/BANDAGES/DRESSINGS) ×2 IMPLANT
STRIP CLOSURE SKIN 1/2X4 (GAUZE/BANDAGES/DRESSINGS) ×2 IMPLANT
SUT MNCRL AB 4-0 PS2 18 (SUTURE) ×2 IMPLANT
TOWEL OR 17X26 10 PK STRL BLUE (TOWEL DISPOSABLE) ×2 IMPLANT
TOWEL OR NON WOVEN STRL DISP B (DISPOSABLE) ×2 IMPLANT
TRAY LAP CHOLE (CUSTOM PROCEDURE TRAY) ×2 IMPLANT
TROCAR BLADELESS OPT 5 75 (ENDOMECHANICALS) ×4 IMPLANT
TROCAR XCEL BLUNT TIP 100MML (ENDOMECHANICALS) ×2 IMPLANT
TROCAR XCEL NON-BLD 11X100MML (ENDOMECHANICALS) ×2 IMPLANT
TUBING INSUFFLATION 10FT LAP (TUBING) ×2 IMPLANT

## 2012-05-29 NOTE — Interval H&P Note (Signed)
History and Physical Interval Note:  05/29/2012 8:58 AM  Frederick Abbott  has presented today for surgery, with the diagnosis of chronic cholecystitis  The various methods of treatment have been discussed with the patient and family. After consideration of risks, benefits and other options for treatment, the patient has consented to  Procedure(s) (LRB): LAPAROSCOPIC CHOLECYSTECTOMY WITH INTRAOPERATIVE CHOLANGIOGRAM (N/A) as a surgical intervention .  The patient's history has been reviewed, patient examined, no change in status, stable for surgery.  I have reviewed the patient's chart and labs.  Questions were answered to the patient's satisfaction.     Virginie Josten K.

## 2012-05-29 NOTE — H&P (View-Only) (Signed)
Patient Frederick: Frederick Abbott, male   DOB: 1971-01-04, 41 y.o.   MRN: [id number]  Chief Complaint  Patient presents with  . Pre-op Exam    eval gallbladder with stones    HPI Frederick Abbott is a 41 y.o. male.  Referred by Dr. Weldon Inches for evaluation of gallbladder symptoms  Frederick Abbott - Frederick Abbott HPI 41 year old male who [facility] for severe right upper quadrant abdominal pain associated with diarrhea, nausea, radiation of pain to his back and shoulder. He was evaluated in the ED.  Liver function tests and lipase were normal. Ultrasound showed small stones and sludge within the gallbladder. No sign of wall thickening. Common bile duct was normal. In retrospect, the patient reports intermittent symptoms of the last couple of years of abdominal bloating, belching, abdominal pain, nausea, vomiting, and diarrhea. This most recent attack was the worst. It lasted for a few hours. Past Medical History  Diagnosis Date  . Diabetes mellitus   . HIV (human immunodeficiency virus infection)   . Hypercholesterolemia   . Seizures     Past Surgical History  Procedure Date  . Joint replacement   . Tonsillectomy 1973    Family History  Problem Relation Age of Onset  . Cancer Mother     breast  . Cancer Maternal Grandmother     melanoma-nose  . Cancer Paternal Grandfather     melanoma-ear    Social History History  Substance Use Topics  . Smoking status: Former Smoker -- 0.8 packs/day for 20 years    Types: Cigarettes    Quit date: 05/06/2006  . Smokeless tobacco: Never Used  . Alcohol Use: 0.5 oz/week    1 drink(s) per week     1-2 drinks every week    Allergies  Allergen Reactions  . Metformin And Related Nausea And Vomiting  . Phenytoin     Current Outpatient Prescriptions  Medication Sig Dispense Refill  . ALPRAZolam (XANAX) 1 MG tablet Take 1 mg by mouth daily.      Marland Kitchen aspirin 325 MG tablet Take 325 mg by mouth daily.        Marland Kitchen BAYER MICROLET LANCETS lancets       . buPROPion (WELLBUTRIN) 75 MG tablet Take 1 tablet (75 mg total) by mouth daily.  90 tablet  1  . efavirenz-emtrictabine-tenofovir (ATRIPLA) 600-200-300 MG per tablet Take 1 tablet by mouth at bedtime.  90 tablet  1  . gemfibrozil (LOPID) 600 MG tablet Take 1 tablet (600 mg total) by mouth 2 (two) times daily.  180 tablet  3  . glipiZIDE (GLUCOTROL) 10 MG 24 hr tablet Take 10 mg by mouth daily.       Marland Kitchen glucose blood (BAYER CONTOUR TEST) test strip Use as directed.  300 each  prn  . glycopyrrolate (ROBINUL-FORTE) 2 MG tablet Take 1 tablet (2 mg total) by mouth 3 (three) times daily.  60 tablet  0  . Insulin Glargine (LANTUS [place]) Inject 44 Units as directed.      . loratadine (CLARITIN) 10 MG tablet Take 10 mg by mouth daily.      . ranitidine (ZANTAC) 150 MG tablet Take 150 mg by mouth daily.      . sitaGLIPtin (JANUVIA) 100 MG tablet Take 100 mg by mouth at bedtime.      Marland Kitchen loratadine (CLARITIN) 10 MG tablet Take 1 tablet (10 mg total) by mouth daily.  30 tablet  2  Review of Systems Review of Systems  Constitutional: Negative for fever, chills and unexpected weight change.  HENT: Negative for hearing loss, congestion, sore throat, trouble swallowing and voice change.   Eyes: Negative for visual disturbance.  Respiratory: Negative for cough and wheezing.   Cardiovascular: Negative for chest pain, palpitations and leg swelling.  Gastrointestinal: Positive for nausea, vomiting, abdominal pain, diarrhea and abdominal distention. Negative for constipation, blood in stool, anal bleeding and rectal pain.  Genitourinary: Negative for hematuria and difficulty urinating.  Musculoskeletal: Negative for arthralgias.  Skin: Negative for rash and wound.  Neurological: Negative for seizures, syncope, weakness and headaches.  Hematological: Negative for adenopathy. Does not bruise/bleed easily.  Psychiatric/Behavioral: Negative for confusion.    Blood pressure  146/92, pulse 90, temperature 98.9 F (37.2 C), temperature source Temporal, height 5\' 10"  (1.778 m), weight 174 lb 12.8 oz (79.289 kg), SpO2 98.00%.  Physical Exam Physical Exam WDWN in NAD HEENT:  EOMI, sclera anicteric Neck:  No masses, no thyromegaly Lungs:  CTA bilaterally; normal respiratory effort CV:  Regular rate and rhythm; no murmurs Abd:  +bowel sounds, soft, non-tender, upper rectus diastasis; small umbilical hernia;  Ext:  Well-perfused; no edema Skin:  Warm, dry; no sign of jaundice  Data Reviewed Ultrasound/ labs   Assessment    Chronic cholecystitis    Plan    Laparoscopic cholecystectomy with IOC. The surgical procedure has been discussed with the patient.  Potential risks, benefits, alternative treatments, and expected outcomes have been explained.  All of the patient's questions at this time have been answered.  The likelihood of reaching the patient's treatment goal is good.  The patient understand the proposed surgical procedure and wishes to proceed.        Kayce Chismar K. 05/09/2012, 11:43 AM

## 2012-05-29 NOTE — Transfer of Care (Signed)
Immediate Anesthesia Transfer of Care Note  Patient: Frederick Abbott  Procedure(s) Performed: Procedure(s) (LRB): LAPAROSCOPIC CHOLECYSTECTOMY WITH INTRAOPERATIVE CHOLANGIOGRAM (N/A)  Patient Location: PACU  Anesthesia Type: General  Level of Consciousness: sedated  Airway & Oxygen Therapy: Patient Spontanous Breathing and Patient connected to face mask oxygen  Post-op Assessment: Report given to PACU RN and Post -op Vital signs reviewed and stable  Post vital signs: Reviewed and stable  Complications: No apparent anesthesia complications

## 2012-05-29 NOTE — Anesthesia Postprocedure Evaluation (Signed)
Anesthesia Post Note  Patient: Frederick Abbott  Procedure(s) Performed: Procedure(s) (LRB): LAPAROSCOPIC CHOLECYSTECTOMY WITH INTRAOPERATIVE CHOLANGIOGRAM (N/A)  Anesthesia type: General  Patient location: PACU  Post pain: Pain level controlled  Post assessment: Post-op Vital signs reviewed  Last Vitals:  Filed Vitals:   05/29/12 1400  BP: 139/90  Pulse:   Temp:   Resp:     Post vital signs: Reviewed  Level of consciousness: sedated  Complications: No apparent anesthesia complications

## 2012-05-29 NOTE — Anesthesia Preprocedure Evaluation (Signed)
Anesthesia Evaluation  Patient identified by MRN, date of birth, ID band Patient awake    Reviewed: Allergy & Precautions, H&P , NPO status , Patient's Chart, lab work & pertinent test results  Airway Mallampati: II TM Distance: >3 FB Neck ROM: Full    Dental  (+) Caps,    Pulmonary neg pulmonary ROS,  breath sounds clear to auscultation  Pulmonary exam normal       Cardiovascular negative cardio ROS  Rhythm:Regular Rate:Normal     Neuro/Psych Seizures -, Well Controlled,  Anxiety Depression negative neurological ROS  negative psych ROS   GI/Hepatic negative GI ROS, Neg liver ROS, GERD-  ,(+) Hepatitis -  Endo/Other  negative endocrine ROSType 2, Insulin Dependent  Renal/GU negative Renal ROS  negative genitourinary   Musculoskeletal negative musculoskeletal ROS (+)   Abdominal   Peds negative pediatric ROS (+)  Hematology negative hematology ROS (+) HIV,   Anesthesia Other Findings   Reproductive/Obstetrics negative OB ROS                           Anesthesia Physical Anesthesia Plan  ASA: III  Anesthesia Plan: General   Post-op Pain Management:    Induction: Intravenous  Airway Management Planned: Oral ETT  Additional Equipment:   Intra-op Plan:   Post-operative Plan: Extubation in OR  Informed Consent: I have reviewed the patients History and Physical, chart, labs and discussed the procedure including the risks, benefits and alternatives for the proposed anesthesia with the patient or authorized representative who has indicated his/her understanding and acceptance.   Dental advisory given  Plan Discussed with: CRNA  Anesthesia Plan Comments:         Anesthesia Quick Evaluation

## 2012-05-29 NOTE — Op Note (Signed)
Laparoscopic Cholecystectomy with IOC Procedure Note  Indications: This patient presents with symptomatic gallbladder disease and will undergo laparoscopic cholecystectomy.  Pre-operative Diagnosis: Calculus of gallbladder with other cholecystitis, without mention of obstruction  Post-operative Diagnosis: Same  Surgeon: Bennetta Rudden K.   Assistants: none  Anesthesia: General endotracheal anesthesia  ASA Class: 2  Procedure Details  The patient was seen again in the Holding Room. The risks, benefits, complications, treatment options, and expected outcomes were discussed with the patient. The possibilities of reaction to medication, pulmonary aspiration, perforation of viscus, bleeding, recurrent infection, finding a normal gallbladder, the need for additional procedures, failure to diagnose a condition, the possible need to convert to an open procedure, and creating a complication requiring transfusion or operation were discussed with the patient. The likelihood of improving the patient's symptoms with return to their baseline status is good.  The patient and/or family concurred with the proposed plan, giving informed consent. The site of surgery properly noted. The patient was taken to Operating Room, identified as [name] and the procedure verified as Laparoscopic Cholecystectomy with Intraoperative Cholangiogram. A Time Out was held and the above information confirmed.  Prior to the induction of general anesthesia, antibiotic prophylaxis was administered. General endotracheal anesthesia was then administered and tolerated well. After the induction, the abdomen was prepped with Chloraprep and draped in the sterile fashion. The patient was positioned in the supine position.  Local anesthetic agent was injected into the skin near the umbilicus and an incision made. We dissected down to the abdominal fascia with blunt dissection.  The fascia was incised vertically and we entered the  peritoneal cavity bluntly.  A pursestring suture of 0-Vicryl was placed around the fascial opening.  The Hasson cannula was inserted and secured with the stay suture.  Pneumoperitoneum was then created with CO2 and tolerated well without any adverse changes in the patient's vital signs. An 11-mm port was placed in the subxiphoid position.  Two 5-mm ports were placed in the right upper quadrant. All skin incisions were infiltrated with a local anesthetic agent before making the incision and placing the trocars.   We positioned the patient in reverse Trendelenburg, tilted slightly to the patient's left.  The gallbladder was identified, the fundus grasped and retracted cephalad. Adhesions were lysed bluntly and with the electrocautery where indicated, taking care not to injure any adjacent organs or viscus. The infundibulum was grasped and retracted laterally, exposing the peritoneum overlying the triangle of Calot. This was then divided and exposed in a blunt fashion. A critical view of the cystic duct and cystic artery was obtained.  The cystic duct was clearly identified and bluntly dissected circumferentially. The cystic duct was ligated with a clip distally.   An incision was made in the cystic duct and the [facility] cholangiogram catheter introduced. The catheter was secured using a clip. A cholangiogram was then obtained which showed good visualization of the distal and proximal biliary tree with no sign of filling defects or obstruction.  Contrast flowed easily into the duodenum. The catheter was then removed.   The cystic duct was then ligated with clips and divided. The cystic artery was identified, dissected free, ligated with clips and divided as well.   The gallbladder was dissected from the liver bed in retrograde fashion with the electrocautery. The gallbladder was removed and placed in an Endocatch sac. The liver bed was irrigated and inspected. Hemostasis was achieved with the electrocautery. Copious  irrigation was utilized and was repeatedly aspirated until clear.  The gallbladder and Endocatch sac were then removed through the umbilical port site.  The pursestring suture was used to close the umbilical fascia.    We again inspected the right upper quadrant for hemostasis.  Pneumoperitoneum was released as we removed the trocars.  4-0 Monocryl was used to close the skin.   Benzoin, steri-strips, and clean dressings were applied. The patient was then extubated and brought to the recovery room in stable condition. Instrument, sponge, and needle counts were correct at closure and at the conclusion of the case.   Findings: Cholecystitis with Cholelithiasis  Estimated Blood Loss: Minimal         Drains: none          Specimens: Gallbladder           Complications: None; patient tolerated the procedure well.         Disposition: PACU - hemodynamically stable.         Condition: stable  Wilmon Arms. Corliss Skains, MD, [facility] Surgery  05/29/2012 1:32 PM

## 2012-05-30 ENCOUNTER — Encounter (HOSPITAL_COMMUNITY): Payer: Self-pay | Admitting: Surgery

## 2012-06-02 ENCOUNTER — Telehealth (INDEPENDENT_AMBULATORY_CARE_PROVIDER_SITE_OTHER): Payer: Self-pay | Admitting: General Surgery

## 2012-06-02 NOTE — Telephone Encounter (Signed)
Pt called to ask general postop  questions about recovery, pain and return to work.  Answered all his questions and he will call back Monday if he doesn't think he can RTW on Wed as planned.  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  Called in Hydrocodone 5/325 mg, # 30, 1-2 po Q 4-6 H prn pain, no refill to [facility]:  [phone].

## 2012-06-13 ENCOUNTER — Encounter (INDEPENDENT_AMBULATORY_CARE_PROVIDER_SITE_OTHER): Payer: BC Managed Care – PPO | Admitting: Surgery

## 2012-06-13 ENCOUNTER — Ambulatory Visit (INDEPENDENT_AMBULATORY_CARE_PROVIDER_SITE_OTHER): Payer: BC Managed Care – PPO | Admitting: Surgery

## 2012-06-13 ENCOUNTER — Encounter (INDEPENDENT_AMBULATORY_CARE_PROVIDER_SITE_OTHER): Payer: Self-pay | Admitting: Surgery

## 2012-06-13 VITALS — BP 128/76 | HR 70 | Temp 97.8°F | Resp 16 | Ht 70.0 in | Wt 165.2 lb

## 2012-06-13 DIAGNOSIS — K8010 Calculus of gallbladder with chronic cholecystitis without obstruction: Secondary | ICD-10-CM

## 2012-06-13 NOTE — Progress Notes (Signed)
Status post laparoscopic cholecystectomy with intraoperative cholangiogram on 05/29/12. The patient had chronic cholecystitis. He is doing much better. Appetite and bowel movements have returned to normal. He is tolerating redmeat without any problems. His incisions are well healed with no sign of infection. He may resume full activity and followup p.r.n.  Wilmon Arms. Corliss Skains, MD, [facility]  06/13/2012 5:54 PM

## 2012-06-22 ENCOUNTER — Telehealth (INDEPENDENT_AMBULATORY_CARE_PROVIDER_SITE_OTHER): Payer: Self-pay

## 2012-06-22 NOTE — Telephone Encounter (Signed)
Pt called b/c reporting an incision that is to the right of the belly button having a longer time to heal than the rest of the incisions. The pt stated he noticed a stitch coming out from the incision so he clipped it to make flush with the skin and he has been putting Neosporin with a bandaide over top of it. The pt has no other symptoms that our concerning. I advised him of the symptoms to look for if something changes with the incision. The pt understands. The pt will just give it a little longer to heal.

## 2012-06-27 ENCOUNTER — Other Ambulatory Visit: Payer: Self-pay | Admitting: Internal Medicine

## 2012-08-31 ENCOUNTER — Ambulatory Visit (INDEPENDENT_AMBULATORY_CARE_PROVIDER_SITE_OTHER): Payer: BC Managed Care – PPO | Admitting: Family Medicine

## 2012-08-31 ENCOUNTER — Encounter: Payer: Self-pay | Admitting: Family Medicine

## 2012-08-31 VITALS — BP 120/76 | HR 74 | Temp 97.8°F | Ht 70.0 in | Wt 169.2 lb

## 2012-08-31 DIAGNOSIS — M87059 Idiopathic aseptic necrosis of unspecified femur: Secondary | ICD-10-CM

## 2012-08-31 DIAGNOSIS — E785 Hyperlipidemia, unspecified: Secondary | ICD-10-CM

## 2012-08-31 DIAGNOSIS — E119 Type 2 diabetes mellitus without complications: Secondary | ICD-10-CM

## 2012-08-31 DIAGNOSIS — F3289 Other specified depressive episodes: Secondary | ICD-10-CM

## 2012-08-31 DIAGNOSIS — B20 Human immunodeficiency virus [HIV] disease: Secondary | ICD-10-CM

## 2012-08-31 DIAGNOSIS — F329 Major depressive disorder, single episode, unspecified: Secondary | ICD-10-CM

## 2012-08-31 DIAGNOSIS — R569 Unspecified convulsions: Secondary | ICD-10-CM

## 2012-08-31 NOTE — Assessment & Plan Note (Signed)
New to provider.  Pt following w/ Endo.  Overdue on eye exam.  Strongly encouraged him to schedule.  Will make contact w/ Endo in hopes that they will forward labs and office notes in the future so I can follow along.

## 2012-08-31 NOTE — Assessment & Plan Note (Signed)
New to provider.  Following regularly w/ Dr [name].  Very compliant w/ tx.  Counts good.

## 2012-08-31 NOTE — Patient Instructions (Addendum)
We'll ask Dr [name] to get your labs in November and we'll make any changes if needed Plan on a complete physical in April Keep up the good work!  You look great!!! Call with any questions or concerns Think of [place] as your home base Welcome!  We're glad to have you!

## 2012-08-31 NOTE — Assessment & Plan Note (Signed)
New to provider.  Pt doing well s/p bilateral hip replacements.

## 2012-08-31 NOTE — Assessment & Plan Note (Signed)
New to provider, chronic for pt.  On Gemfibrozil for high trigs.  Pt's levels are very labile.  Due for labs next month.  Will defer to Dr [name] for lab draw but will adjust meds based on results.  Pt expressed understanding and is in agreement w/ plan.

## 2012-08-31 NOTE — Assessment & Plan Note (Signed)
New to provider.  Pt feels sxs are well controlled on current meds.  Since pt is stable, no changes at this time.

## 2012-08-31 NOTE — Progress Notes (Signed)
  Subjective:    Patient ID: Frederick Abbott, male    DOB: 05-May-1971, 41 y.o.   MRN: [id number]  HPI New to establish.  ID- [name]  EndoSharl Ma  HIV- chronic problem, on Atripla.  Seeing Dr [name] twice yearly.  DM- chronic problem, seeing Dr [name] regularly.  On Lantus, Glipizide, Januvia.  Strong family history.  Overdue on eye exam.  No numbness or tingling of hands or feet.  Hypertriglyceridemia- chronic problem, on Gemfibrozil for this.  No N/V, abd pain, myalgias.  No CP, SOB, HAs, visual changes.  Anxiety/depression- chronic problem for pt since diagnosis w/ HIV.  Feels sxs are currently well controlled on Wellbutrin and xanax.  Seizure disorder- last sz at age 32, not following w/ neuro, no restrictions.  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 Constitutional: He is oriented to person, place, and time. He appears well-developed and well-nourished. No distress.  HENT:  Head: Normocephalic and atraumatic.  Eyes: Conjunctivae normal and EOM are normal. Pupils are equal, round, and reactive to light.  Neck: Normal range of motion. Neck supple. No thyromegaly present.  Cardiovascular: Normal rate, regular rhythm, normal heart sounds and intact distal pulses.   No murmur heard. Pulmonary/Chest: Effort normal and breath sounds normal. No respiratory distress.  Abdominal: Soft. Bowel sounds are normal. He exhibits no distension.  Musculoskeletal: He exhibits no edema.  Lymphadenopathy:    He has no cervical adenopathy.  Neurological: He is alert and oriented to person, place, and time. No cranial nerve deficit.  Skin: Skin is warm and dry.  Psychiatric: He has a normal mood and affect. His behavior is normal.          Assessment & Plan:

## 2012-08-31 NOTE — Assessment & Plan Note (Signed)
New to provider.  This was childhood issue that pt has outgrown.  Last seizure was age 41.  Not following w/ neuro, no restrictions, no meds.

## 2012-09-19 ENCOUNTER — Other Ambulatory Visit (INDEPENDENT_AMBULATORY_CARE_PROVIDER_SITE_OTHER): Payer: BC Managed Care – PPO

## 2012-09-19 DIAGNOSIS — B20 Human immunodeficiency virus [HIV] disease: Secondary | ICD-10-CM

## 2012-09-19 LAB — COMPREHENSIVE METABOLIC PANEL
ALT: 32 U/L (ref 0–53)
AST: 26 U/L (ref 0–37)
Alkaline Phosphatase: 82 U/L (ref 39–117)
BUN: 17 mg/dL (ref 6–23)
Creat: 1.02 mg/dL (ref 0.50–1.35)
Total Bilirubin: 0.5 mg/dL (ref 0.3–1.2)

## 2012-09-19 LAB — CBC
HCT: 45.2 % (ref 39.0–52.0)
MCV: 85.8 fL (ref 78.0–100.0)
RBC: 5.27 MIL/uL (ref 4.22–5.81)
RDW: 13.4 % (ref 11.5–15.5)
WBC: 8.1 10*3/uL (ref 4.0–10.5)

## 2012-09-19 LAB — LIPID PANEL
HDL: 30 mg/dL — ABNORMAL LOW (ref >39)
Total CHOL/HDL Ratio: 6.5 Ratio

## 2012-09-20 LAB — HIV-1 RNA QUANT-NO REFLEX-BLD: HIV-1 RNA Quant, Log: <1.3 {Log} (ref <1.30)

## 2012-10-03 ENCOUNTER — Ambulatory Visit (INDEPENDENT_AMBULATORY_CARE_PROVIDER_SITE_OTHER): Payer: BC Managed Care – PPO | Admitting: Internal Medicine

## 2012-10-03 DIAGNOSIS — B20 Human immunodeficiency virus [HIV] disease: Secondary | ICD-10-CM

## 2012-10-03 NOTE — Progress Notes (Signed)
Patient ID: Frederick Abbott, male   DOB: [birth year], 41 y.o.   MRN: [id number]     [facility]  Patient Active Problem List  Diagnosis  . HIV DISEASE  . VENEREAL WART  . DIABETES MELLITUS, TYPE II  . DYSLIPIDEMIA  . ANXIETY  . DEPRESSION  . DECREASED HEARING, BILATERAL  . ALLERGIC RHINITIS  . GERD  . CARBUNCLE/FURUNCLE NOS  . PSORIASIS NEC  . LOW BACK PAIN, CHRONIC  . NECROSIS, ASEPTIC, FEMUR HEAD/NECK  . SEIZURE DISORDER  . HEPATITIS B, HX OF  . Heel pain  . Chronic cholecystitis with calculus    Patient's Medications  New Prescriptions   No medications on file  Previous Medications   ALPRAZOLAM (XANAX) 1 MG TABLET    Take 1 mg by mouth daily with breakfast.    AMOXICILLIN (AMOXIL) 500 MG CAPSULE       ASPIRIN 325 MG TABLET    Take 325 mg by mouth daily with breakfast.    BAYER MICROLET LANCETS LANCETS    by Other route 3 (three) times daily as needed.    BUPROPION (WELLBUTRIN) 75 MG TABLET    Take 75 mg by mouth at bedtime.   EFAVIRENZ-EMTRICTABINE-TENOFOVIR (ATRIPLA) 600-200-300 MG PER TABLET    Take 1 tablet by mouth at bedtime.   GEMFIBROZIL (LOPID) 600 MG TABLET    Take 1 tablet (600 mg total) by mouth 2 (two) times daily.   GLIPIZIDE (GLUCOTROL) 10 MG 24 HR TABLET    Take 10 mg by mouth daily with breakfast.    GLUCOSE BLOOD TEST STRIP    1 each by Other route 3 (three) times daily as needed. Use as directed.   INSULIN GLARGINE (LANTUS [place])    Inject 36 Units as directed every evening.    LORATADINE (CLARITIN) 10 MG TABLET    Take 10 mg by mouth daily.   RANITIDINE (ZANTAC) 150 MG TABLET    Take 150 mg by mouth daily with breakfast.    SITAGLIPTIN (JANUVIA) 100 MG TABLET    Take 100 mg by mouth at bedtime.  Modified Medications   No medications on file  Discontinued Medications   No medications on file    Subjective: Frederick Abbott is in for his routine visit. He had an uneventful cholecystectomy earlier this year. He is doing well and has  only been late on one dose of his Atripla since his last visit other than when he was in the hospital for his surgery. He has already received his influenza vaccine.  Objective:    General: He is in good spirits Skin: No rash Lungs: Clear Cor: Regular S1 and S2 no murmurs  Lab Results HIV 1 RNA Quant (copies/mL)  Date Value  09/19/2012 <20   03/16/2012 <20   09/10/2011 Comment: HIV 1 RNA not detected.      CD4 T Cell Abs (cmm)  Date Value  03/16/2012 930   09/10/2011 950   02/08/2011 950      Assessment: He did not get a recent CD4 count about his viral load remains undetectable so there is no reason to suspect that there would be any change in his normal CD4 count. I will have him continue Atripla in followup after labs in 6 months.  Plan: 1. Continue Atripla 2. Return after lab work in 6 months   Cliffton Asters, MD [facility] [phone] pager   [phone] cell 10/03/2012, 9:33 AM

## 2012-11-15 ENCOUNTER — Other Ambulatory Visit: Payer: Self-pay | Admitting: Internal Medicine

## 2012-11-16 ENCOUNTER — Other Ambulatory Visit: Payer: Self-pay | Admitting: *Deleted

## 2012-11-16 ENCOUNTER — Other Ambulatory Visit: Payer: Self-pay | Admitting: Family Medicine

## 2012-11-16 NOTE — Telephone Encounter (Signed)
refill ALPRAZolam (Tab) XANAX 1 MG Take 1 mg by mouth daily with breakfast last fill 5.6.13

## 2012-11-16 NOTE — Telephone Encounter (Signed)
Pharmacy requested refill for Xanax 1mg  daily with breakfast. Rx denied by [facility] re: patient being cared for by PCP Dr. Neena Rhymes. Patient called and notified. RN suggested to patient to call Dr. Rennis Golden office and follow up the Rx refill with them. Patient verbalized understanding. Note routed to Dr. [name] ([facility]) and Dr. Beverely Low (PCP). Andree Coss, RN

## 2012-11-16 NOTE — Telephone Encounter (Signed)
LM @ (4:48pm) asking the pt to RTC.//AB/CMA 

## 2012-11-16 NOTE — Telephone Encounter (Signed)
Ok for #30, 3 refills- needs to sign agreement and do UDS if not already done  

## 2012-11-17 NOTE — Telephone Encounter (Signed)
Please advise on RF request.  Last OV:08-31-12 and last labs:10-03-12.//AB/CMA

## 2012-11-19 NOTE — Telephone Encounter (Signed)
Ok for #30, 3 refills- needs to sign agreement and do UDS if not already done

## 2012-11-20 MED ORDER — ALPRAZOLAM 1 MG PO TABS: 1.0000 mg | ORAL_TABLET | Freq: Every day | ORAL | Status: DC

## 2012-11-20 NOTE — Telephone Encounter (Signed)
LM @ (4:59pm) asking the pt to RTC.//AB/CMA

## 2012-11-21 NOTE — Telephone Encounter (Signed)
Pt aware will come by on Friday to pickup Rx

## 2012-12-23 ENCOUNTER — Other Ambulatory Visit: Payer: Self-pay

## 2012-12-23 ENCOUNTER — Other Ambulatory Visit: Payer: Self-pay | Admitting: Internal Medicine

## 2012-12-23 DIAGNOSIS — B20 Human immunodeficiency virus [HIV] disease: Secondary | ICD-10-CM

## 2012-12-25 ENCOUNTER — Encounter: Payer: Self-pay | Admitting: Family Medicine

## 2013-01-31 ENCOUNTER — Emergency Department (HOSPITAL_COMMUNITY)
Admission: EM | Admit: 2013-01-31 | Discharge: 2013-01-31 | Disposition: A | Discharge status: Home / Self Care | Payer: BC Managed Care – PPO | Attending: Emergency Medicine | Admitting: Emergency Medicine

## 2013-01-31 ENCOUNTER — Emergency Department (HOSPITAL_COMMUNITY): Payer: BC Managed Care – PPO

## 2013-01-31 ENCOUNTER — Encounter (HOSPITAL_COMMUNITY): Payer: Self-pay | Admitting: Nurse Practitioner

## 2013-01-31 DIAGNOSIS — E119 Type 2 diabetes mellitus without complications: Secondary | ICD-10-CM | POA: Insufficient documentation

## 2013-01-31 DIAGNOSIS — Z96649 Presence of unspecified artificial hip joint: Secondary | ICD-10-CM | POA: Insufficient documentation

## 2013-01-31 DIAGNOSIS — K219 Gastro-esophageal reflux disease without esophagitis: Secondary | ICD-10-CM | POA: Insufficient documentation

## 2013-01-31 DIAGNOSIS — Z794 Long term (current) use of insulin: Secondary | ICD-10-CM | POA: Insufficient documentation

## 2013-01-31 DIAGNOSIS — Z8719 Personal history of other diseases of the digestive system: Secondary | ICD-10-CM | POA: Insufficient documentation

## 2013-01-31 DIAGNOSIS — Z21 Asymptomatic human immunodeficiency virus [HIV] infection status: Secondary | ICD-10-CM | POA: Insufficient documentation

## 2013-01-31 DIAGNOSIS — Z8639 Personal history of other endocrine, nutritional and metabolic disease: Secondary | ICD-10-CM | POA: Insufficient documentation

## 2013-01-31 DIAGNOSIS — S73005A Unspecified dislocation of left hip, initial encounter: Secondary | ICD-10-CM

## 2013-01-31 DIAGNOSIS — Y9389 Activity, other specified: Secondary | ICD-10-CM | POA: Insufficient documentation

## 2013-01-31 DIAGNOSIS — Y92009 Unspecified place in unspecified non-institutional (private) residence as the place of occurrence of the external cause: Secondary | ICD-10-CM | POA: Insufficient documentation

## 2013-01-31 DIAGNOSIS — Z7982 Long term (current) use of aspirin: Secondary | ICD-10-CM | POA: Insufficient documentation

## 2013-01-31 DIAGNOSIS — S73016A Posterior dislocation of unspecified hip, initial encounter: Secondary | ICD-10-CM | POA: Insufficient documentation

## 2013-01-31 DIAGNOSIS — Z8619 Personal history of other infectious and parasitic diseases: Secondary | ICD-10-CM | POA: Insufficient documentation

## 2013-01-31 DIAGNOSIS — Z8659 Personal history of other mental and behavioral disorders: Secondary | ICD-10-CM | POA: Insufficient documentation

## 2013-01-31 DIAGNOSIS — X500XXA Overexertion from strenuous movement or load, initial encounter: Secondary | ICD-10-CM | POA: Insufficient documentation

## 2013-01-31 DIAGNOSIS — Z8669 Personal history of other diseases of the nervous system and sense organs: Secondary | ICD-10-CM | POA: Insufficient documentation

## 2013-01-31 DIAGNOSIS — Z79899 Other long term (current) drug therapy: Secondary | ICD-10-CM | POA: Insufficient documentation

## 2013-01-31 DIAGNOSIS — Z87891 Personal history of nicotine dependence: Secondary | ICD-10-CM | POA: Insufficient documentation

## 2013-01-31 DIAGNOSIS — Z862 Personal history of diseases of the blood and blood-forming organs and certain disorders involving the immune mechanism: Secondary | ICD-10-CM | POA: Insufficient documentation

## 2013-01-31 MED ORDER — PROPOFOL 10 MG/ML IV BOLUS
0.5000 mg/kg | Freq: Once | INTRAVENOUS | Status: DC
  Filled 2013-01-31: qty 60

## 2013-01-31 MED ORDER — HYDROCODONE-ACETAMINOPHEN 5-325 MG PO TABS: 2.0000 | ORAL_TABLET | ORAL | Status: DC | PRN

## 2013-01-31 MED ORDER — SODIUM CHLORIDE 0.9 % IV BOLUS (SEPSIS)
1000.0000 mL | Freq: Once | INTRAVENOUS | Status: AC
  Administered 2013-01-31: 1000 mL via INTRAVENOUS

## 2013-01-31 MED ORDER — PROPOFOL 10 MG/ML IV BOLUS
INTRAVENOUS | Status: AC | PRN
  Administered 2013-01-31: 40 mg via INTRAVENOUS

## 2013-01-31 MED ORDER — HYDROMORPHONE HCL PF 1 MG/ML IJ SOLN
1.0000 mg | INTRAMUSCULAR | Status: AC
  Administered 2013-01-31: 1 mg via INTRAVENOUS
  Filled 2013-01-31: qty 1

## 2013-01-31 MED ORDER — ONDANSETRON HCL 4 MG/2ML IJ SOLN
4.0000 mg | Freq: Once | INTRAMUSCULAR | Status: AC
  Administered 2013-01-31: 4 mg via INTRAVENOUS
  Filled 2013-01-31: qty 2

## 2013-01-31 MED ORDER — SODIUM CHLORIDE 0.9 % IV SOLN
INTRAVENOUS | Status: DC
  Administered 2013-01-31: 01:00:00 via INTRAVENOUS

## 2013-01-31 MED ORDER — IBUPROFEN 600 MG PO TABS: 600.0000 mg | ORAL_TABLET | Freq: Four times a day (QID) | ORAL | Status: DC | PRN

## 2013-01-31 NOTE — ED Provider Notes (Signed)
History     CSN: [id number]  Arrival date & time 01/31/13  0046   First MD Initiated Contact with Patient 01/31/13 0051      Chief Complaint  Patient presents with  . Hip Pain    (Consider location/radiation/quality/duration/timing/severity/associated sxs/prior treatment) HPI History provided by patient. At home tonight sitting down and he went to get up and while shifting his weight noticed pain in his left hip and now has severe pain when he tries to move it. Unable to ambulate. Pain is sharp and not radiating. 8 years ago had hip replacement by Dr. Despina Hick. He has never had dislocation or complications from that. No weakness or numbness. No history of same. No comfortable position or known alleviating factors. Brought in by EMS and received IV fentanyl in route. Past Medical History  Diagnosis Date  . Diabetes mellitus   . HIV (human immunodeficiency virus infection)   . Hypercholesterolemia   . Neuropathy   . GERD (gastroesophageal reflux disease)   . Hepatitis     hepatitis B  . Cholecystitis chronic   . Depression   . Cancer   . Seizures     as a child, none since age 47    Past Surgical History  Procedure Laterality Date  . Tonsillectomy  1973  . Joint replacement      bil total hips  . Cholecystectomy  05/29/2012    Procedure: LAPAROSCOPIC CHOLECYSTECTOMY WITH INTRAOPERATIVE CHOLANGIOGRAM;  Surgeon: Wilmon Arms. Corliss Skains, MD;  Location: [facility] ORS;  Service: General;  Laterality: N/A;    Family History  Problem Relation Age of Onset  . Cancer Mother     breast  . Cancer Maternal Grandmother     melanoma-nose  . Cancer Paternal Grandfather     melanoma-ear    History  Substance Use Topics  . Smoking status: Former Smoker -- 0.80 packs/day for 20 years    Types: Cigarettes    Quit date: 05/06/2006  . Smokeless tobacco: Never Used  . Alcohol Use: 0.5 oz/week    1 drink(s) per week     Comment: 1-2 drinks every week      Review of Systems  Constitutional:  Negative for fever and chills.  HENT: Negative for neck pain and neck stiffness.   Eyes: Negative for pain.  Respiratory: Negative for shortness of breath.   Cardiovascular: Negative for chest pain.  Gastrointestinal: Negative for abdominal pain.  Genitourinary: Negative for dysuria.  Musculoskeletal: Negative for back pain.  Skin: Negative for rash.  Neurological: Negative for headaches.  All other systems reviewed and are negative.    Allergies  Metformin and related and Phenytoin  Home Medications   Current Outpatient Rx  Name  Route  Sig  Dispense  Refill  . ALPRAZolam (XANAX) 1 MG tablet   Oral   Take 1 tablet (1 mg total) by mouth daily with breakfast.   30 tablet   3   . amoxicillin (AMOXIL) 500 MG capsule               . aspirin 325 MG tablet   Oral   Take 325 mg by mouth daily with breakfast.          . ATRIPLA 600-200-300 MG per tablet      TAKE 1 TABLET BY MOUTH AT BEDTIME   90 tablet   1   . BAYER MICROLET LANCETS lancets   Other   by Other route 3 (three) times daily as needed.          Marland Kitchen  EXPIRED: buPROPion (WELLBUTRIN) 75 MG tablet   Oral   Take 75 mg by mouth at bedtime.         Marland Kitchen efavirenz-emtrictabine-tenofovir (ATRIPLA) 600-200-300 MG per tablet   Oral   Take 1 tablet by mouth at bedtime.   90 tablet   1   . gemfibrozil (LOPID) 600 MG tablet   Oral   Take 1 tablet (600 mg total) by mouth 2 (two) times daily.   180 tablet   3   . glipiZIDE (GLUCOTROL) 10 MG 24 hr tablet   Oral   Take 10 mg by mouth daily with breakfast.          . glucose blood test strip   Other   1 each by Other route 3 (three) times daily as needed. Use as directed.         . Insulin Glargine (LANTUS [place])   Injection   Inject 36 Units as directed every evening.          . loratadine (CLARITIN) 10 MG tablet   Oral   Take 10 mg by mouth daily.         . ranitidine (ZANTAC) 150 MG tablet   Oral   Take 150 mg by mouth daily with breakfast.           . sitaGLIPtin (JANUVIA) 100 MG tablet   Oral   Take 100 mg by mouth at bedtime.           BP 161/94  Pulse 99  Temp(Src) 98.1 F (36.7 C) (Oral)  Resp 19  Wt 169 lb 1.5 oz (76.7 kg)  BMI 24.26 kg/m2  SpO2 100%  Physical Exam  Constitutional: He is oriented to person, place, and time. He appears well-developed and well-nourished.  HENT:  Head: Normocephalic and atraumatic.  Mouth/Throat: Oropharynx is clear and moist. No oropharyngeal exudate.  Eyes: EOM are normal. Pupils are equal, round, and reactive to light. No scleral icterus.  Neck: Normal range of motion. Neck supple.  Cardiovascular: Normal rate, regular rhythm and intact distal pulses.   Pulmonary/Chest: Effort normal and breath sounds normal. No respiratory distress.  Abdominal: Soft. Bowel sounds are normal. He exhibits no distension.  Musculoskeletal:  Left lower extremity with deformity and tenderness over the hip. Hip is held in mild flexion and knee flexion with distal neurovascular intact. Equal dorsalis pedis pulses. No pelvic instability.  Neurological: He is alert and oriented to person, place, and time.  Skin: Skin is warm and dry.    ED Course  Procedural sedation Date/Time: 01/31/2013 2:03 AM Performed by: Sunnie Nielsen Authorized by: Sunnie Nielsen Consent: Verbal consent obtained. Risks and benefits: risks, benefits and alternatives were discussed Consent given by: patient Patient understanding: patient states understanding of the procedure being performed Patient consent: the patient's understanding of the procedure matches consent given Procedure consent: procedure consent matches procedure scheduled Required items: required blood products, implants, devices, and special equipment available Patient identity confirmed: verbally with patient Time out: Immediately prior to procedure a "time out" was called to verify the correct patient, procedure, equipment, support staff and site/side  marked as required. Preparation: Patient was prepped and draped in the usual sterile fashion. Patient sedated: yes Sedation type: moderate (conscious) sedation Sedatives: propofol Sedation start date/time: 01/31/2013 1:44 AM Sedation end date/time: 01/31/2013 2:04 AM Vitals: Vital signs were monitored during sedation. Patient tolerance: Patient tolerated the procedure well with no immediate complications.  Reduction of dislocation Date/Time: 01/31/2013 2:04 AM Performed by: Dierdre Highman,  Javian Nudd Authorized by: Sunnie Nielsen Consent: Verbal consent obtained. Risks and benefits: risks, benefits and alternatives were discussed Consent given by: patient Patient understanding: patient states understanding of the procedure being performed Patient consent: the patient's understanding of the procedure matches consent given Procedure consent: procedure consent matches procedure scheduled Required items: required blood products, implants, devices, and special equipment available Patient identity confirmed: verbally with patient Time out: Immediately prior to procedure a "time out" was called to verify the correct patient, procedure, equipment, support staff and site/side marked as required. Preparation: Patient was prepped and draped in the usual sterile fashion. Patient tolerance: Patient tolerated the procedure well with no immediate complications. Comments: Left lower extremity traction applied with reduction of left hip. no immediate complications   (including critical care time)  IV fluids. IV Dilaudid.  Labs Reviewed - No data to display Dg Hip Complete Left  01/31/2013  *RADIOLOGY REPORT*  Clinical Data: Left hip pain.  LEFT HIP - COMPLETE 2+ VIEW  Comparison: 02/03/2005.  Findings: The left femoral head prosthesis is dislocated posteriorly.  No fracture.  The right hip prosthesis is intact. The pubic symphysis and SI joints are intact.  IMPRESSION: Dislocation of the left femoral head prosthesis.    Original Report Authenticated By: Rudie Meyer, M.D.    Patient agrees to procedure sedation - hip reduction as above  Postreduction films reviewed and hip is located.  On recheck patient feeling much better. Attempted to notify [facility] orthopedics.   Plan discharge home with close orthopedic follow up, crutches as needed. Prescriptions for pain medications as needed provided. Written and verbal precautions given. Work note provided for today and patient states understanding procedural sedation precautions  MDM  Left hip prosthetic dislocation with reduction in the ED. Procedure sedation IV propofol.  X-rays. Postreduction films obtained. IV narcotics. IV fluids.  Patient observed after sedation and is appropriate for discharge home.      Sunnie Nielsen, MD 01/31/13 [phone]

## 2013-01-31 NOTE — ED Notes (Signed)
Per EMS pt was getting off couch, attempted to get up and felt sharp pain in left hip, hip appears dislocated hip inward rotation. Pedal pulses palpated.

## 2013-01-31 NOTE — ED Notes (Signed)
Pt placed on NRB.

## 2013-01-31 NOTE — ED Notes (Signed)
Given fentanyl enroute 0034

## 2013-02-07 ENCOUNTER — Encounter: Payer: Self-pay | Admitting: Family Medicine

## 2013-02-07 ENCOUNTER — Ambulatory Visit (INDEPENDENT_AMBULATORY_CARE_PROVIDER_SITE_OTHER): Payer: BC Managed Care – PPO | Admitting: Family Medicine

## 2013-02-07 VITALS — BP 120/80 | HR 81 | Temp 97.6°F | Ht 71.25 in | Wt 163.2 lb

## 2013-02-07 DIAGNOSIS — L301 Dyshidrosis [pompholyx]: Secondary | ICD-10-CM

## 2013-02-07 DIAGNOSIS — Z0000 Encounter for general adult medical examination without abnormal findings: Secondary | ICD-10-CM

## 2013-02-07 LAB — BASIC METABOLIC PANEL
CO2: 25 mEq/L (ref 19–32)
Glucose, Bld: 137 mg/dL — ABNORMAL HIGH (ref 70–99)
Potassium: 3.5 mEq/L (ref 3.5–5.1)
Sodium: 139 mEq/L (ref 135–145)

## 2013-02-07 LAB — LIPID PANEL
HDL: 26.8 mg/dL — ABNORMAL LOW (ref >39.00)
Total CHOL/HDL Ratio: 5

## 2013-02-07 LAB — CBC WITH DIFFERENTIAL/PLATELET
Basophils Absolute: 0 10*3/uL (ref 0.0–0.1)
Eosinophils Relative: 8.4 % — ABNORMAL HIGH (ref 0.0–5.0)
Lymphocytes Relative: 34.5 % (ref 12.0–46.0)
Monocytes Relative: 9 % (ref 3.0–12.0)
Neutrophils Relative %: 47.7 % (ref 43.0–77.0)
Platelets: 389 10*3/uL (ref 150.0–400.0)
WBC: 8.1 10*3/uL (ref 4.5–10.5)

## 2013-02-07 LAB — HEPATIC FUNCTION PANEL
AST: 27 U/L (ref 0–37)
Albumin: 4.1 g/dL (ref 3.5–5.2)
Alkaline Phosphatase: 96 U/L (ref 39–117)
Bilirubin, Direct: 0 mg/dL (ref 0.0–0.3)
Total Protein: 7.4 g/dL (ref 6.0–8.3)

## 2013-02-07 LAB — LDL CHOLESTEROL, DIRECT: Direct LDL: 74.9 mg/dL

## 2013-02-07 MED ORDER — TRIAMCINOLONE ACETONIDE 0.1 % EX OINT: TOPICAL_OINTMENT | Freq: Two times a day (BID) | CUTANEOUS | Status: DC

## 2013-02-07 NOTE — Patient Instructions (Addendum)
Follow up in 6 months to recheck cholesterol We'll notify you of your lab results and make any changes as needed (will also fax to Dr [name]) Keep up the good work!  You look great! Call with any questions or concerns Happy Early Birthday!!!

## 2013-02-07 NOTE — Progress Notes (Signed)
  Subjective:    Patient ID: Frederick Abbott, male    DOB: 12-05-1970, 42 y.o.   MRN: [id number]  HPI CPE- no concerns today, has worsening eczema on hands   Review of Systems Patient reports no vision/hearing changes, anorexia, fever ,adenopathy, persistant/recurrent hoarseness, swallowing issues, chest pain, palpitations, edema, persistant/recurrent cough, hemoptysis, dyspnea (rest,exertional, paroxysmal nocturnal), gastrointestinal  bleeding (melena, rectal bleeding), abdominal pain, excessive heart burn, GU symptoms (dysuria, hematuria, voiding/incontinence issues) syncope, focal weakness, memory loss, numbness & tingling, skin/hair/nail changes, depression, anxiety, abnormal bruising/bleeding, musculoskeletal symptoms/signs.     Objective:   Physical Exam BP 120/80  Pulse 81  Temp(Src) 97.6 F (36.4 C) (Oral)  Ht 5' 11.25" (1.81 m)  Wt 163 lb 3.2 oz (74.027 kg)  BMI 22.6 kg/m2  SpO2 98%  General Appearance:    Alert, cooperative, no distress, appears stated age  Head:    Normocephalic, without obvious abnormality, atraumatic  Eyes:    PERRL, conjunctiva/corneas clear, EOM's intact, fundi    benign, both eyes       Ears:    Normal TM's and external ear canals, both ears  Nose:   Nares normal, septum midline, mucosa normal, no drainage   or sinus tenderness  Throat:   Lips, mucosa, and tongue normal; teeth and gums normal  Neck:   Supple, symmetrical, trachea midline, no adenopathy;       thyroid:  No enlargement/tenderness/nodules  Back:     Symmetric, no curvature, ROM normal, no CVA tenderness  Lungs:     Clear to auscultation bilaterally, respirations unlabored  Chest wall:    No tenderness or deformity  Heart:    Regular rate and rhythm, S1 and S2 normal, no murmur, rub   or gallop  Abdomen:     Soft, non-tender, bowel sounds active all four quadrants,    no masses, no organomegaly  Genitalia:    Normal male without lesion, discharge or tenderness  Rectal:    Deferred  due to young age  Extremities:   Extremities normal, atraumatic, no cyanosis or edema  Pulses:   2+ and symmetric all extremities  Skin:   Skin color, texture, turgor normal, eczematous patches on hands bilaterally  Lymph nodes:   Cervical, supraclavicular, and axillary nodes normal  Neurologic:   CNII-XII intact. Normal strength, sensation and reflexes      throughout          Assessment & Plan:

## 2013-02-11 NOTE — Assessment & Plan Note (Signed)
New.  Start steroid ointment on hands twice daily.  Pt expressed understanding and is in agreement w/ plan.

## 2013-02-11 NOTE — Assessment & Plan Note (Signed)
Pt's PE WNL.  Check labs.  Anticipatory guidance provided.  

## 2013-05-17 ENCOUNTER — Encounter: Payer: Self-pay | Admitting: Family Medicine

## 2013-07-30 ENCOUNTER — Telehealth: Payer: Self-pay | Admitting: General Practice

## 2013-07-30 DIAGNOSIS — E785 Hyperlipidemia, unspecified: Secondary | ICD-10-CM

## 2013-07-30 DIAGNOSIS — B20 Human immunodeficiency virus [HIV] disease: Secondary | ICD-10-CM

## 2013-07-30 NOTE — Telephone Encounter (Signed)
Yes- please contact ID office and determine what labs are needed and we'll do all labs at time of pt's visit

## 2013-07-30 NOTE — Telephone Encounter (Signed)
Spoke with Infectious disease. Ordered a CD4, HIV 1 RNA QUANT NO REFLEX-bld, and RPR as requested.

## 2013-07-30 NOTE — Telephone Encounter (Signed)
Message on triage line: pt called wanting to know if we could get in contact with Dr. [name] his infectious disease provider. Pt is due for an OV with [facility] on 10-2 and is scheduled for a few days later with Dr. [name] would like labs completed all at one place. Ok to contact Campbell's office to inquire what labs are needed?

## 2013-08-09 ENCOUNTER — Encounter: Payer: Self-pay | Admitting: Family Medicine

## 2013-08-09 ENCOUNTER — Ambulatory Visit (INDEPENDENT_AMBULATORY_CARE_PROVIDER_SITE_OTHER): Payer: BC Managed Care – PPO | Admitting: Family Medicine

## 2013-08-09 VITALS — BP 110/82 | HR 100 | Temp 98.2°F | Resp 16 | Wt 163.1 lb

## 2013-08-09 DIAGNOSIS — H6980 Other specified disorders of Eustachian tube, unspecified ear: Secondary | ICD-10-CM | POA: Insufficient documentation

## 2013-08-09 DIAGNOSIS — R350 Frequency of micturition: Secondary | ICD-10-CM

## 2013-08-09 DIAGNOSIS — H6982 Other specified disorders of Eustachian tube, left ear: Secondary | ICD-10-CM

## 2013-08-09 DIAGNOSIS — N390 Urinary tract infection, site not specified: Secondary | ICD-10-CM | POA: Insufficient documentation

## 2013-08-09 DIAGNOSIS — E785 Hyperlipidemia, unspecified: Secondary | ICD-10-CM

## 2013-08-09 DIAGNOSIS — Z23 Encounter for immunization: Secondary | ICD-10-CM

## 2013-08-09 DIAGNOSIS — B20 Human immunodeficiency virus [HIV] disease: Secondary | ICD-10-CM

## 2013-08-09 DIAGNOSIS — E119 Type 2 diabetes mellitus without complications: Secondary | ICD-10-CM

## 2013-08-09 LAB — CBC WITH DIFFERENTIAL/PLATELET
Basophils Relative: 0.4 % (ref 0.0–3.0)
Eosinophils Relative: 3.1 % (ref 0.0–5.0)
Lymphocytes Relative: 27.9 % (ref 12.0–46.0)
MCV: 89.4 fl (ref 78.0–100.0)
Monocytes Relative: 10.8 % (ref 3.0–12.0)
Neutrophils Relative %: 57.8 % (ref 43.0–77.0)
Platelets: 346 10*3/uL (ref 150.0–400.0)
RBC: 5.41 Mil/uL (ref 4.22–5.81)
WBC: 9.4 10*3/uL (ref 4.5–10.5)

## 2013-08-09 LAB — POCT URINALYSIS DIPSTICK
Bilirubin, UA: NEGATIVE
Blood, UA: NEGATIVE
Glucose, UA: NEGATIVE
Leukocytes, UA: NEGATIVE
Nitrite, UA: NEGATIVE
Urobilinogen, UA: 0.2
pH, UA: 6

## 2013-08-09 NOTE — Assessment & Plan Note (Signed)
Check labs that pt will need for upcoming appt w/ Dr [name].

## 2013-08-09 NOTE — Progress Notes (Signed)
  Subjective:    Patient ID: Frederick Abbott, male    DOB: Aug 31, 1971, 42 y.o.   MRN: [id number]  HPI DM- chronic problem, last saw Endo in May, has appt upcoming in Nov.  Hyperlipidemia- chronic problem, last labs in April showed normal cholesterol but high trigs.  Has been attempting to control w/ healthy diet, regular exercise.  L ear- intermittent external discomfort, associated w/ internal warmth.  No drainage from ear.  No fevers.  UTI- pt was seen at [facility] and was treated w/ Cipro, not convinced infxn is gone.  Still having discomfort w/ ejaculation.  No pain w/ sitting.  Some increased frequency.  No urgency.  Had sensation of incomplete emptying.   Review of Systems For ROS see HPI     Objective:   Physical Exam  Vitals reviewed. Constitutional: He is oriented to person, place, and time. He appears well-developed and well-nourished. No distress.  HENT:  Head: Normocephalic and atraumatic.  L TM retracted, no visible fluid or abnormality or EAC  Eyes: Conjunctivae and EOM are normal. Pupils are equal, round, and reactive to light.  Neck: Normal range of motion. Neck supple. No thyromegaly present.  Cardiovascular: Normal rate, regular rhythm, normal heart sounds and intact distal pulses.   No murmur heard. Pulmonary/Chest: Effort normal and breath sounds normal. No respiratory distress.  Abdominal: Soft. Bowel sounds are normal. He exhibits no distension.  Musculoskeletal: He exhibits no edema.  Lymphadenopathy:    He has no cervical adenopathy.  Neurological: He is alert and oriented to person, place, and time. No cranial nerve deficit.  Skin: Skin is warm and dry.  Psychiatric: He has a normal mood and affect. His behavior is normal.          Assessment & Plan:

## 2013-08-09 NOTE — Assessment & Plan Note (Signed)
Pt interested in test of cure.  No evidence of UTI on UA- but signs of dehydration.  Encouraged increased fluid intake.

## 2013-08-09 NOTE — Assessment & Plan Note (Signed)
Continues to follow w/ Endo- doing well per pt report.

## 2013-08-09 NOTE — Patient Instructions (Addendum)
Schedule your complete physical in April We'll notify you of your lab results and make any changes if needed No evidence of UTI- but it does show dehydration which can cause burning.  Increase your water intake! The ear pain is due to your allergies- continue the Zyrtec Call with any questions or concerns Happy Fall!!!

## 2013-08-09 NOTE — Assessment & Plan Note (Signed)
Chronic problem.  Not currently on meds.  Was trying to work on healthy diet and regular exercise.  Check labs.  Start meds prn.

## 2013-08-09 NOTE — Assessment & Plan Note (Signed)
New.  This is likely the cause of pt's L ear pain.  Reviewed cause (allergic rhinitis) and treatment (oral antihistamine).

## 2013-08-10 LAB — BASIC METABOLIC PANEL
CO2: 23 mEq/L (ref 19–32)
Calcium: 8.9 mg/dL (ref 8.4–10.5)
Creatinine, Ser: 0.9 mg/dL (ref 0.4–1.5)
GFR: 97.66 mL/min (ref >60.00)
Sodium: 141 mEq/L (ref 135–145)

## 2013-08-10 LAB — HEPATIC FUNCTION PANEL
ALT: 44 U/L (ref 0–53)
AST: 28 U/L (ref 0–37)
Albumin: 4.1 g/dL (ref 3.5–5.2)
Total Bilirubin: 0.6 mg/dL (ref 0.3–1.2)

## 2013-08-10 LAB — LIPID PANEL
Cholesterol: 193 mg/dL (ref 0–200)
HDL: 40.5 mg/dL (ref >39.00)
Total CHOL/HDL Ratio: 5
Triglycerides: 222 mg/dL — ABNORMAL HIGH (ref 0.0–149.0)
VLDL: 44.4 mg/dL — ABNORMAL HIGH (ref 0.0–40.0)

## 2013-08-10 LAB — HIV-1 RNA QUANT-NO REFLEX-BLD
HIV 1 RNA Quant: <20 copies/mL (ref <20)
HIV-1 RNA Quant, Log: <1.3 {Log} (ref <1.30)

## 2013-08-27 ENCOUNTER — Ambulatory Visit (INDEPENDENT_AMBULATORY_CARE_PROVIDER_SITE_OTHER): Payer: BC Managed Care – PPO | Admitting: Internal Medicine

## 2013-08-27 ENCOUNTER — Other Ambulatory Visit: Payer: Self-pay | Admitting: *Deleted

## 2013-08-27 ENCOUNTER — Encounter: Payer: Self-pay | Admitting: Internal Medicine

## 2013-08-27 VITALS — BP 126/88 | HR 97 | Temp 97.6°F | Ht 70.0 in | Wt 163.0 lb

## 2013-08-27 DIAGNOSIS — B20 Human immunodeficiency virus [HIV] disease: Secondary | ICD-10-CM

## 2013-08-27 MED ORDER — EFAVIRENZ-EMTRICITAB-TENOFOVIR 600-200-300 MG PO TABS: ORAL_TABLET | ORAL | Status: DC

## 2013-08-27 NOTE — Progress Notes (Signed)
Patient ID: Frederick Abbott, male   DOB: 1971/03/22, 42 y.o.   MRN: [id number]          [facility] for Infectious Disease  Patient Active Problem List   Diagnosis Date Noted  . DIABETES MELLITUS, TYPE II 12/11/2009    Priority: High  . DYSLIPIDEMIA 08/20/2008    Priority: High  . HIV DISEASE 06/01/2007    Priority: High  . Eustachian tube dysfunction 08/09/2013  . UTI (urinary tract infection) 08/09/2013  . Routine general medical examination at a health care facility 02/07/2013  . Dyshidrotic eczema 02/07/2013  . Chronic cholecystitis with calculus 05/09/2012  . Heel pain 09/23/2011  . DECREASED HEARING, BILATERAL 09/18/2009  . LOW BACK PAIN, CHRONIC 10/17/2008  . VENEREAL WART 08/20/2008  . GERD 06/05/2007  . ANXIETY 06/01/2007  . DEPRESSION 06/01/2007  . ALLERGIC RHINITIS 06/01/2007  . CARBUNCLE/FURUNCLE NOS 06/01/2007  . PSORIASIS NEC 06/01/2007  . NECROSIS, ASEPTIC, FEMUR HEAD/NECK 06/01/2007  . SEIZURE DISORDER 06/01/2007  . HEPATITIS B, HX OF 06/01/2007    Patient's Medications  New Prescriptions   No medications on file  Previous Medications   AMOXICILLIN (AMOXIL) 500 MG CAPSULE       ASPIRIN 81 MG TABLET    Take 81 mg by mouth daily.   ATRIPLA 600-200-300 MG PER TABLET    TAKE 1 TABLET BY MOUTH AT BEDTIME   BAYER MICROLET LANCETS LANCETS    by Other route 3 (three) times daily as needed.    BD PEN NEEDLE NANO U/F 32G X 4 MM MISC    Inject 24 Units into the skin daily.   CETIRIZINE (ZYRTEC) 10 MG TABLET    Take 10 mg by mouth daily.   GLUCOSE BLOOD TEST STRIP    1 each by Other route 3 (three) times daily as needed. Use as directed.   IBUPROFEN (ADVIL,MOTRIN) 600 MG TABLET    Take 1 tablet (600 mg total) by mouth every 6 (six) hours as needed for pain.   INSULIN GLARGINE (LANTUS SOLOSTAR) 100 UNIT/ML SOPN    Inject 16 Units into the skin.   RANITIDINE (ZANTAC) 150 MG TABLET    Take 150 mg by mouth daily with breakfast.    TRIAMCINOLONE OINTMENT  (KENALOG) 0.1 %    Apply topically 2 (two) times daily.   VICTOZA 18 MG/3ML SOLN INJECTION    Inject 1.8 mg into the skin daily.  Modified Medications   No medications on file  Discontinued Medications   ALPRAZOLAM (XANAX) 1 MG TABLET    Take 1 tablet (1 mg total) by mouth daily with breakfast.   BUPROPION (WELLBUTRIN) 75 MG TABLET    Take 75 mg by mouth at bedtime.   CIPROFLOXACIN (CIPRO) 250 MG TABLET       GEMFIBROZIL (LOPID) 600 MG TABLET    Take 1 tablet (600 mg total) by mouth 2 (two) times daily.   GLIPIZIDE (GLUCOTROL) 10 MG 24 HR TABLET    Take 10 mg by mouth daily with breakfast.    HYDROCODONE-ACETAMINOPHEN (NORCO/VICODIN) 5-325 MG PER TABLET    Take 2 tablets by mouth every 4 (four) hours as needed for pain.   LORATADINE (CLARITIN) 10 MG TABLET    Take 10 mg by mouth daily.    Subjective: Frederick Abbott is in for his routine visit. He thinks he may have missed 2-3 doses of his Atripla since his last visit one year ago. He is still working at the [employer]  in [place], [place]. 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 He has a 55 minute commute both ways. He finds the stress related to working with his customers to be quite difficult. However, he is in the process of writing, and book and finds that this helps relieve his stress. He is also quite happy that he recently was able to legally married his long-time partner, Frederick Abbott.   Objective: Temp: 97.6 F (36.4 C) (10/20 0927) Temp src: Oral (10/20 0927) BP: 126/88 mmHg (10/20 0927) Pulse Rate: 97 (10/20 0927)  General: He is in good spirits Oral: No oropharyngeal lesions Skin: Small benign-appearing skin tag on left lower abdomen Lungs: Clear Cor: Regular S1 and S2 no murmurs Mood and affect: Normal  Lab Results HIV 1 RNA Quant (copies/mL)  Date Value  08/09/2013 <20   09/19/2012 <20   03/16/2012 <20      CD4 T Cell Abs (cmm)  Date Value  03/16/2012 930   09/10/2011 950   02/08/2011 950       Assessment: His viral load has been consistently undetectable for years. Although CD4 count was ordered when he had his most recent blood work drawn at Dr. Rennis Golden office for some reason it was canceled. His last CD4 count was 930 in May of 2013. I do not feel he needs a repeat CD4 count today because I would predict that his CD4 count remains well up in the normal range given that his viral load is still undetectable.  Plan: 1. Continue Atripla 2. He has already received his influenza vaccination this year 3. Follow up after lab work in 6 months   Cliffton Asters, MD [facility] for Infectious Disease [facility] [phone] pager   [phone] cell 08/27/2013, 9:51 AM

## 2013-09-19 LAB — HM DIABETES FOOT EXAM

## 2013-09-27 ENCOUNTER — Encounter: Payer: Self-pay | Admitting: General Practice

## 2013-11-15 IMAGING — CR DG HIP 1V PORT*L*
1 series · 1 of 1 positions shown · non-contrast
Comparison: 01/31/2013, [DATE] hours.

CLINICAL DATA: Post reduction radiographs.  Left hip dislocation.

PORTABLE LEFT HIP - 1 VIEW

[AP]
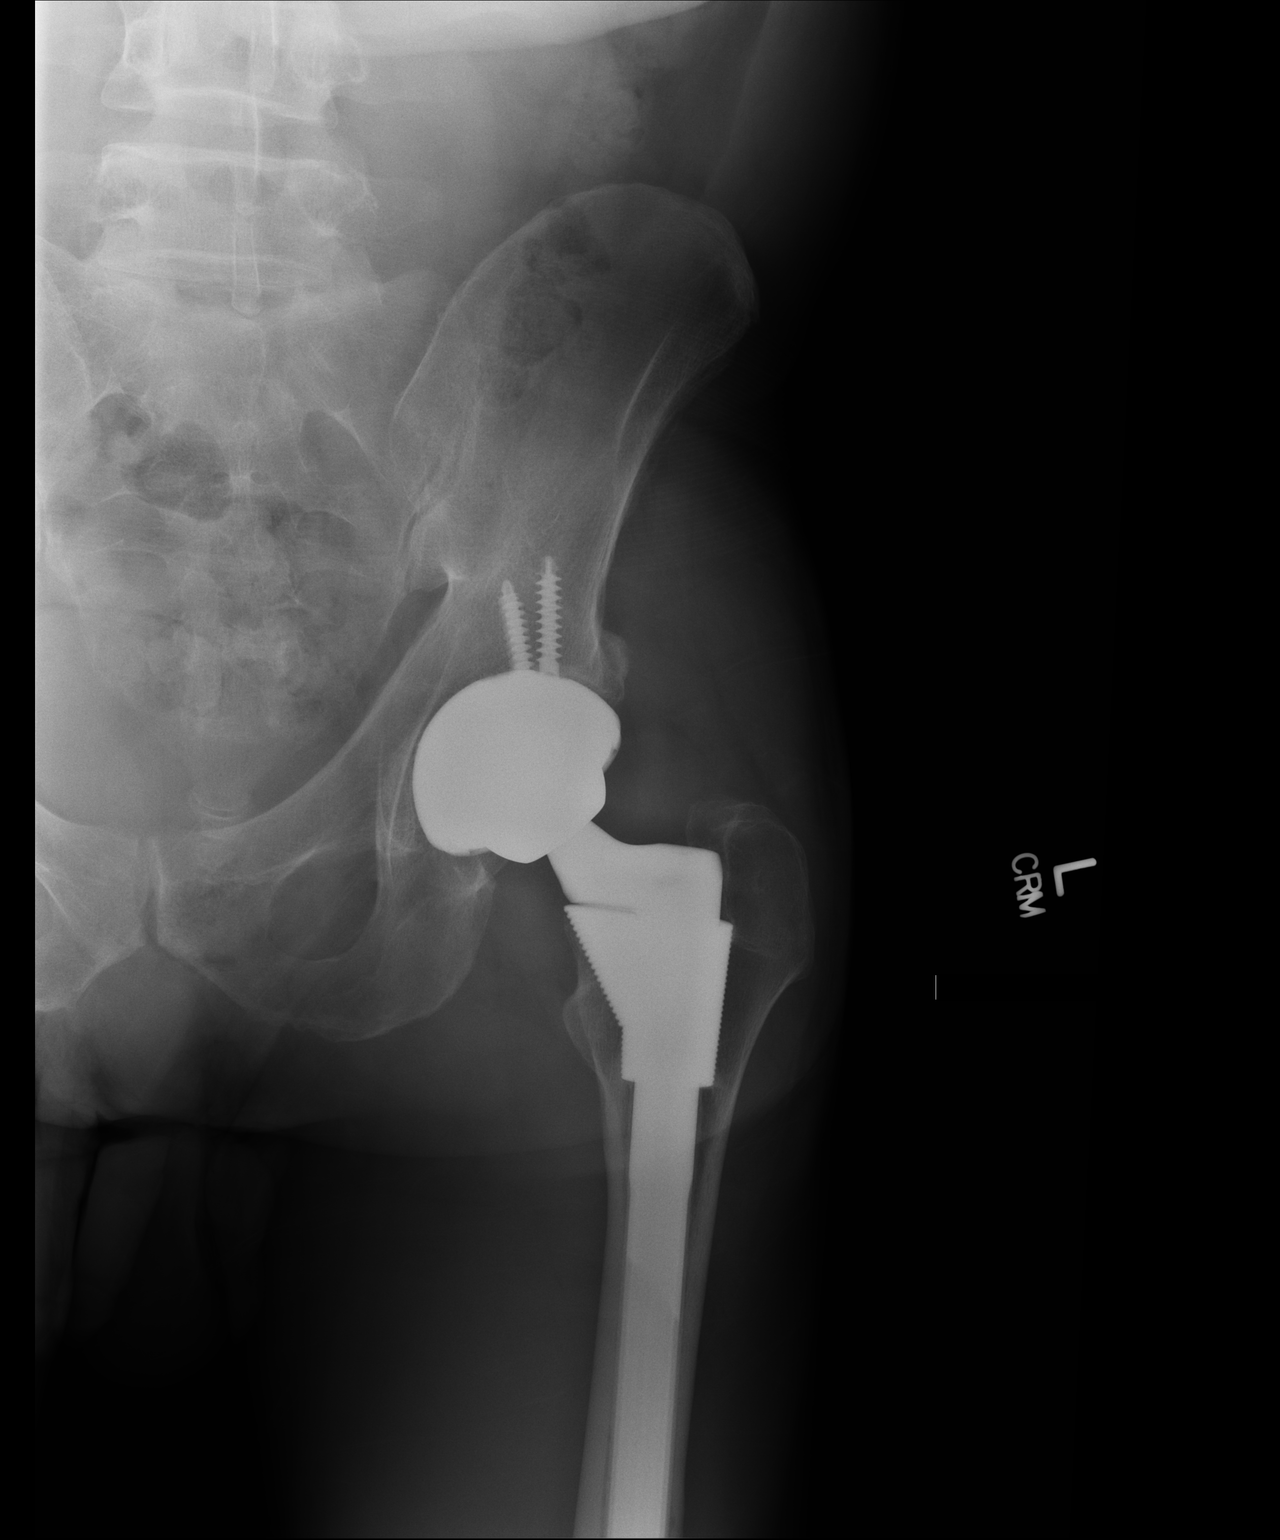

[1 of 1 positions shown; findings below may reference images not displayed]

FINDINGS: The left posterior hip arthroplasty dislocation has been
reduced.  No periprosthetic fracture or complication is identified.
IMPRESSION: Reduction of left posterior hip dislocation.

## 2014-01-14 ENCOUNTER — Encounter: Payer: Self-pay | Admitting: Family Medicine

## 2014-02-07 ENCOUNTER — Telehealth: Payer: Self-pay

## 2014-02-07 NOTE — Telephone Encounter (Signed)
Medication List and allergies:  Updated and Reviewed  90 day supply/mail order: n/a Local prescriptions:  [id number] - [place], [place] - [address]  Immunization due:  UTD  A/P: No changes to personal, family or PSH Flu- 08/09/13 Tdap- 08/09/13 PNA- 09/23/11   To discuss with provider: Nothing at this time.

## 2014-02-11 ENCOUNTER — Encounter: Payer: Self-pay | Admitting: General Practice

## 2014-02-11 ENCOUNTER — Other Ambulatory Visit: Payer: Self-pay | Admitting: General Practice

## 2014-02-11 ENCOUNTER — Ambulatory Visit (INDEPENDENT_AMBULATORY_CARE_PROVIDER_SITE_OTHER): Payer: BC Managed Care – PPO | Admitting: Family Medicine

## 2014-02-11 ENCOUNTER — Encounter: Payer: Self-pay | Admitting: Family Medicine

## 2014-02-11 VITALS — BP 110/72 | HR 108 | Temp 98.4°F | Resp 16 | Ht 71.0 in | Wt 166.5 lb

## 2014-02-11 DIAGNOSIS — D72829 Elevated white blood cell count, unspecified: Secondary | ICD-10-CM

## 2014-02-11 DIAGNOSIS — Z0000 Encounter for general adult medical examination without abnormal findings: Secondary | ICD-10-CM

## 2014-02-11 LAB — CBC WITH DIFFERENTIAL/PLATELET
BASOS PCT: 0.2 % (ref 0.0–3.0)
Basophils Absolute: 0 10*3/uL (ref 0.0–0.1)
EOS PCT: 0.4 % (ref 0.0–5.0)
Eosinophils Absolute: 0.1 10*3/uL (ref 0.0–0.7)
HCT: 47.5 % (ref 39.0–52.0)
Hemoglobin: 16.3 g/dL (ref 13.0–17.0)
Lymphocytes Relative: 13.2 % (ref 12.0–46.0)
Lymphs Abs: 2.4 10*3/uL (ref 0.7–4.0)
MCHC: 34.2 g/dL (ref 30.0–36.0)
MCV: 91.1 fl (ref 78.0–100.0)
MONO ABS: 1.1 10*3/uL — AB (ref 0.1–1.0)
Monocytes Relative: 6.2 % (ref 3.0–12.0)
NEUTROS ABS: 14.6 10*3/uL — AB (ref 1.4–7.7)
Neutrophils Relative %: 80 % — ABNORMAL HIGH (ref 43.0–77.0)
Platelets: 276 10*3/uL (ref 150.0–400.0)
RBC: 5.21 Mil/uL (ref 4.22–5.81)
RDW: 12.7 % (ref 11.5–14.6)

## 2014-02-11 LAB — LIPID PANEL
CHOL/HDL RATIO: 5
Cholesterol: 228 mg/dL — ABNORMAL HIGH (ref 0–200)
HDL: 42.8 mg/dL (ref >39.00)
LDL CALC: 92 mg/dL (ref 0–99)
TRIGLYCERIDES: 466 mg/dL — AB (ref 0.0–149.0)
VLDL: 93.2 mg/dL — ABNORMAL HIGH (ref 0.0–40.0)

## 2014-02-11 LAB — HEPATIC FUNCTION PANEL
ALT: 44 U/L (ref 0–53)
AST: 29 U/L (ref 0–37)
Albumin: 4.2 g/dL (ref 3.5–5.2)
Alkaline Phosphatase: 103 U/L (ref 39–117)
BILIRUBIN TOTAL: 0.6 mg/dL (ref 0.3–1.2)
Bilirubin, Direct: 0 mg/dL (ref 0.0–0.3)
Total Protein: 7 g/dL (ref 6.0–8.3)

## 2014-02-11 LAB — BASIC METABOLIC PANEL
BUN: 12 mg/dL (ref 6–23)
CHLORIDE: 102 meq/L (ref 96–112)
CO2: 26 mEq/L (ref 19–32)
CREATININE: 0.9 mg/dL (ref 0.4–1.5)
Calcium: 9 mg/dL (ref 8.4–10.5)
GFR: 98.69 mL/min (ref >60.00)
Glucose, Bld: 208 mg/dL — ABNORMAL HIGH (ref 70–99)
Potassium: 3.8 mEq/L (ref 3.5–5.1)
Sodium: 137 mEq/L (ref 135–145)

## 2014-02-11 LAB — TSH: TSH: 1.58 u[IU]/mL (ref 0.35–5.50)

## 2014-02-11 MED ORDER — FENOFIBRATE 160 MG PO TABS: 160.0000 mg | ORAL_TABLET | Freq: Every day | ORAL | Status: DC

## 2014-02-11 NOTE — Assessment & Plan Note (Signed)
Pt's PE WNL.  UTD w/ endocrinology.  Check labs.  Anticipatory guidance provided.

## 2014-02-11 NOTE — Progress Notes (Signed)
   Subjective:    Patient ID: Frederick Abbott, male    DOB: 05/27/1971, 43 y.o.   MRN: [id number]  HPI CPE- UTD on endocrinology.  No concerns today.   Review of Systems Patient reports no vision/hearing changes, anorexia, fever ,adenopathy, persistant/recurrent hoarseness, swallowing issues, chest pain, palpitations, edema, persistant/recurrent cough, hemoptysis, dyspnea (rest,exertional, paroxysmal nocturnal), gastrointestinal  bleeding (melena, rectal bleeding), abdominal pain, excessive heart burn, GU symptoms (dysuria, hematuria, voiding/incontinence issues) syncope, focal weakness, memory loss, numbness & tingling, skin/hair/nail changes, depression, anxiety, abnormal bruising/bleeding, musculoskeletal symptoms/signs.     Objective:   Physical Exam General Appearance:    Alert, cooperative, no distress, appears stated age  Head:    Normocephalic, without obvious abnormality, atraumatic  Eyes:    PERRL, conjunctiva/corneas clear, EOM's intact, fundi    benign, both eyes       Ears:    Normal TM's and external ear canals, both ears  Nose:   Nares normal, septum midline, mucosa normal, no drainage   or sinus tenderness  Throat:   Lips, mucosa, and tongue normal; teeth and gums normal  Neck:   Supple, symmetrical, trachea midline, no adenopathy;       thyroid:  No enlargement/tenderness/nodules  Back:     Symmetric, no curvature, ROM normal, no CVA tenderness  Lungs:     Clear to auscultation bilaterally, respirations unlabored  Chest wall:    No tenderness or deformity  Heart:    Regular rate and rhythm, S1 and S2 normal, no murmur, rub   or gallop  Abdomen:     Soft, non-tender, bowel sounds active all four quadrants,    no masses, no organomegaly  Genitalia:    Normal male without lesion, masses,discharge or tenderness  Rectal:    Deferred due to young age  Extremities:   Extremities normal, atraumatic, no cyanosis or edema  Pulses:   2+ and symmetric all extremities  Skin:    Skin color, texture, turgor normal, no rashes or lesions  Lymph nodes:   Cervical, supraclavicular, and axillary nodes normal  Neurologic:   CNII-XII intact. Normal strength, sensation and reflexes      throughout          Assessment & Plan:

## 2014-02-11 NOTE — Progress Notes (Signed)
Pre visit review using our clinic review tool, if applicable. No additional management support is needed unless otherwise documented below in the visit note. 

## 2014-02-11 NOTE — Patient Instructions (Signed)
Follow up in 6 months to recheck cholesterol We'll notify you of your lab results and make any changes if needed Keep up the good work!  You look great! Call with any questions or concerns HAPPY BELATED BIRTHDAY!!!

## 2014-02-14 ENCOUNTER — Other Ambulatory Visit (INDEPENDENT_AMBULATORY_CARE_PROVIDER_SITE_OTHER): Payer: BC Managed Care – PPO

## 2014-02-14 DIAGNOSIS — D72829 Elevated white blood cell count, unspecified: Secondary | ICD-10-CM

## 2014-02-14 LAB — CBC WITH DIFFERENTIAL/PLATELET
Basophils Absolute: 0 10*3/uL (ref 0.0–0.1)
Basophils Relative: 0.2 % (ref 0.0–3.0)
EOS ABS: 0.3 10*3/uL (ref 0.0–0.7)
Eosinophils Relative: 3.7 % (ref 0.0–5.0)
HCT: 48.4 % (ref 39.0–52.0)
Hemoglobin: 16.5 g/dL (ref 13.0–17.0)
Lymphocytes Relative: 32.9 % (ref 12.0–46.0)
Lymphs Abs: 2.8 10*3/uL (ref 0.7–4.0)
MCHC: 34 g/dL (ref 30.0–36.0)
MCV: 91.6 fl (ref 78.0–100.0)
MONO ABS: 1 10*3/uL (ref 0.1–1.0)
Monocytes Relative: 11.2 % (ref 3.0–12.0)
NEUTROS PCT: 52 % (ref 43.0–77.0)
Neutro Abs: 4.5 10*3/uL (ref 1.4–7.7)
PLATELETS: 293 10*3/uL (ref 150.0–400.0)
RBC: 5.29 Mil/uL (ref 4.22–5.81)
RDW: 12.8 % (ref 11.5–14.6)
WBC: 8.6 10*3/uL (ref 4.5–10.5)

## 2014-02-25 ENCOUNTER — Other Ambulatory Visit: Payer: BC Managed Care – PPO

## 2014-02-25 DIAGNOSIS — B20 Human immunodeficiency virus [HIV] disease: Secondary | ICD-10-CM

## 2014-02-25 LAB — COMPREHENSIVE METABOLIC PANEL
ALK PHOS: 81 U/L (ref 39–117)
ALT: 35 U/L (ref 0–53)
AST: 24 U/L (ref 0–37)
Albumin: 4.4 g/dL (ref 3.5–5.2)
BILIRUBIN TOTAL: 0.6 mg/dL (ref 0.2–1.2)
BUN: 17 mg/dL (ref 6–23)
CO2: 25 mEq/L (ref 19–32)
CREATININE: 0.97 mg/dL (ref 0.50–1.35)
Calcium: 9.4 mg/dL (ref 8.4–10.5)
Chloride: 104 mEq/L (ref 96–112)
Glucose, Bld: 136 mg/dL — ABNORMAL HIGH (ref 70–99)
Potassium: 4.5 mEq/L (ref 3.5–5.3)
Sodium: 138 mEq/L (ref 135–145)
Total Protein: 6.8 g/dL (ref 6.0–8.3)

## 2014-02-25 LAB — CBC
HCT: 46.7 % (ref 39.0–52.0)
Hemoglobin: 17 g/dL (ref 13.0–17.0)
MCH: 30.7 pg (ref 26.0–34.0)
MCHC: 36.4 g/dL — AB (ref 30.0–36.0)
MCV: 84.4 fL (ref 78.0–100.0)
PLATELETS: 311 10*3/uL (ref 150–400)
RBC: 5.53 MIL/uL (ref 4.22–5.81)
RDW: 12.6 % (ref 11.5–15.5)
WBC: 9.1 10*3/uL (ref 4.0–10.5)

## 2014-02-26 LAB — HIV-1 RNA QUANT-NO REFLEX-BLD
HIV 1 RNA Quant: 22 copies/mL — ABNORMAL HIGH (ref <20)
HIV-1 RNA Quant, Log: 1.34 {Log} — ABNORMAL HIGH (ref <1.30)

## 2014-02-26 LAB — T-HELPER CELL (CD4) - (RCID CLINIC ONLY)
CD4 % Helper T Cell: 37 % (ref 33–55)
CD4 T Cell Abs: 1180 /uL (ref 400–2700)

## 2014-03-12 ENCOUNTER — Ambulatory Visit: Payer: BC Managed Care – PPO | Admitting: Internal Medicine

## 2014-03-14 ENCOUNTER — Ambulatory Visit: Payer: BC Managed Care – PPO | Admitting: Internal Medicine

## 2014-03-19 LAB — HEMOGLOBIN A1C: Hgb A1c MFr Bld: 6 % (ref 4.0–6.0)

## 2014-03-21 ENCOUNTER — Ambulatory Visit (INDEPENDENT_AMBULATORY_CARE_PROVIDER_SITE_OTHER): Payer: BC Managed Care – PPO | Admitting: Internal Medicine

## 2014-03-21 ENCOUNTER — Encounter: Payer: Self-pay | Admitting: Internal Medicine

## 2014-03-21 VITALS — BP 118/82 | HR 90 | Temp 97.4°F | Wt 159.5 lb

## 2014-03-21 DIAGNOSIS — B20 Human immunodeficiency virus [HIV] disease: Secondary | ICD-10-CM

## 2014-03-21 NOTE — Progress Notes (Signed)
Patient ID: Frederick Abbott, male   DOB: 03/30/1971, 43 y.o.   MRN: [id number]          Patient Active Problem List   Diagnosis Date Noted  . DIABETES MELLITUS, TYPE II 12/11/2009    Priority: High  . DYSLIPIDEMIA 08/20/2008    Priority: High  . HIV DISEASE 06/01/2007    Priority: High  . Eustachian tube dysfunction 08/09/2013  . UTI (urinary tract infection) 08/09/2013  . Routine general medical examination at a health care facility 02/07/2013  . Dyshidrotic eczema 02/07/2013  . Chronic cholecystitis with calculus 05/09/2012  . Heel pain 09/23/2011  . DECREASED HEARING, BILATERAL 09/18/2009  . LOW BACK PAIN, CHRONIC 10/17/2008  . VENEREAL WART 08/20/2008  . GERD 06/05/2007  . ANXIETY 06/01/2007  . DEPRESSION 06/01/2007  . ALLERGIC RHINITIS 06/01/2007  . CARBUNCLE/FURUNCLE NOS 06/01/2007  . PSORIASIS NEC 06/01/2007  . NECROSIS, ASEPTIC, FEMUR HEAD/NECK 06/01/2007  . SEIZURE DISORDER 06/01/2007  . HEPATITIS B, HX OF 06/01/2007    Patient's Medications  New Prescriptions   No medications on file  Previous Medications   ASPIRIN 81 MG TABLET    Take 81 mg by mouth daily.   BAYER MICROLET LANCETS LANCETS    by Other route 3 (three) times daily as needed.    BD PEN NEEDLE NANO U/F 32G X 4 MM MISC    Inject 24 Units into the skin daily.   EFAVIRENZ-EMTRICITABINE-TENOFOVIR (ATRIPLA) 600-200-300 MG PER TABLET    TAKE 1 TABLET BY MOUTH AT BEDTIME   FENOFIBRATE 160 MG TABLET    Take 1 tablet (160 mg total) by mouth daily.   GLUCOSE BLOOD TEST STRIP    1 each by Other route 3 (three) times daily as needed. Use as directed.   LORATADINE (CLARITIN) 10 MG TABLET    Take 10 mg by mouth daily.   RANITIDINE (ZANTAC) 150 MG TABLET    Take 150 mg by mouth daily with breakfast.    TRIAMCINOLONE OINTMENT (KENALOG) 0.1 %    Apply topically 2 (two) times daily.   VICTOZA 18 MG/3ML SOLN INJECTION    Inject 1.8 mg into the skin daily.  Modified Medications   No medications on file    Discontinued Medications   CETIRIZINE (ZYRTEC) 10 MG TABLET    Take 10 mg by mouth daily.    Subjective: Frederick Abbott is in for his routine visit. He is feeling well. He has been laid taking his Atripla on occasion but has not missed any doses.   Review of Systems: Pertinent items are noted in HPI.  Past Medical History  Diagnosis Date  . Diabetes mellitus   . HIV (human immunodeficiency virus infection)   . Hypercholesterolemia   . Neuropathy   . GERD (gastroesophageal reflux disease)   . Hepatitis     hepatitis B  . Cholecystitis chronic   . Depression   . Cancer   . Seizures     as a child, none since age 89    History  Substance Use Topics  . Smoking status: Former Smoker -- 0.80 packs/day for 20 years    Types: Cigarettes    Quit date: 05/06/2006  . Smokeless tobacco: Never Used  . Alcohol Use: 0.5 oz/week    1 drink(s) per week     Comment: 1-2 drinks every week    Family History  Problem Relation Age of Onset  . Cancer Mother     breast  . Cancer Maternal Grandmother  melanoma-nose  . Cancer Paternal Grandfather     melanoma-ear    Allergies  Allergen Reactions  . Metformin And Related Nausea And Vomiting  . Phenytoin     Objective: Temp: 97.4 F (36.3 C) (05/14 0919) Temp src: Oral (05/14 0919) BP: 118/82 mmHg (05/14 0919) Pulse Rate: 90 (05/14 0919) Body mass index is 22.26 kg/(m^2).  General: He is in good spirits Lungs: Clear Cor: Regular S1 and S2 no murmurs  Lab Results Lab Results  Component Value Date   WBC 9.1 02/25/2014   HGB 17.0 02/25/2014   HCT 46.7 02/25/2014   MCV 84.4 02/25/2014   PLT 311 02/25/2014    Lab Results  Component Value Date   CREATININE 0.97 02/25/2014   BUN 17 02/25/2014   NA 138 02/25/2014   K 4.5 02/25/2014   CL 104 02/25/2014   CO2 25 02/25/2014    Lab Results  Component Value Date   ALT 35 02/25/2014   AST 24 02/25/2014   ALKPHOS 81 02/25/2014   BILITOT 0.6 02/25/2014    Lab Results  Component Value Date    CHOL 228* 02/11/2014   HDL 42.80 02/11/2014   LDLCALC 92 02/11/2014   LDLDIRECT 114.1 08/09/2013   TRIG 466.0* 02/11/2014   CHOLHDL 5 02/11/2014    Lab Results HIV 1 RNA Quant (copies/mL)  Date Value  02/25/2014 22*  08/09/2013 <20   09/19/2012 <20      CD4 T Cell Abs (/uL)  Date Value  02/25/2014 1180   03/16/2012 930   09/10/2011 950      Assessment: His HIV infection remains under excellent control.  Plan: 1. Continue Atripla 2. Followup here after blood work in 6 months   Cliffton AstersJohn Daichi Moris, MD [facility] [phone] pager   [phone] cell 03/21/2014, 9:34 AM

## 2014-04-03 ENCOUNTER — Encounter: Payer: Self-pay | Admitting: General Practice

## 2014-06-25 ENCOUNTER — Telehealth: Payer: Self-pay

## 2014-06-25 NOTE — Telephone Encounter (Signed)
Pt brought in Physician Results Form to be completed to continue health coverage.  Form completed based on CPE on 02/11/14.  Placed in Dr. Beverely Lowabori red folder for review and signature.

## 2014-06-28 ENCOUNTER — Other Ambulatory Visit: Payer: Self-pay | Admitting: Internal Medicine

## 2014-07-01 NOTE — Telephone Encounter (Signed)
Received completed and signed Physician Results Form from Dr. Beverely Low.  Form faxed to [facility] at [phone]).  Confirmation received.  LMOM @ (9:59am @ [phone]).//AB/CMA

## 2014-07-02 NOTE — Telephone Encounter (Signed)
Spoke with the pt and he asked for the original Physician Results Form to be mailed to him.  Form mailed.//AB/CMA

## 2014-07-29 ENCOUNTER — Other Ambulatory Visit: Payer: Self-pay | Admitting: General Practice

## 2014-07-29 MED ORDER — FENOFIBRATE 160 MG PO TABS: 160.0000 mg | ORAL_TABLET | Freq: Every day | ORAL | Status: DC

## 2014-08-15 ENCOUNTER — Encounter: Payer: Self-pay | Admitting: Family Medicine

## 2014-08-15 ENCOUNTER — Ambulatory Visit (INDEPENDENT_AMBULATORY_CARE_PROVIDER_SITE_OTHER): Payer: BC Managed Care – PPO | Admitting: Family Medicine

## 2014-08-15 VITALS — BP 120/86 | HR 87 | Temp 98.4°F | Resp 16 | Wt 165.2 lb

## 2014-08-15 DIAGNOSIS — E785 Hyperlipidemia, unspecified: Secondary | ICD-10-CM

## 2014-08-15 DIAGNOSIS — Z23 Encounter for immunization: Secondary | ICD-10-CM

## 2014-08-15 DIAGNOSIS — E119 Type 2 diabetes mellitus without complications: Secondary | ICD-10-CM

## 2014-08-15 DIAGNOSIS — B079 Viral wart, unspecified: Secondary | ICD-10-CM

## 2014-08-15 LAB — HEMOGLOBIN A1C: Hgb A1c MFr Bld: 7.8 % — ABNORMAL HIGH (ref 4.6–6.5)

## 2014-08-15 LAB — HEPATIC FUNCTION PANEL
ALK PHOS: 73 U/L (ref 39–117)
ALT: 69 U/L — ABNORMAL HIGH (ref 0–53)
AST: 72 U/L — ABNORMAL HIGH (ref 0–37)
Albumin: 3.9 g/dL (ref 3.5–5.2)
BILIRUBIN TOTAL: 0.5 mg/dL (ref 0.2–1.2)
Bilirubin, Direct: 0 mg/dL (ref 0.0–0.3)
Total Protein: 7.4 g/dL (ref 6.0–8.3)

## 2014-08-15 LAB — BASIC METABOLIC PANEL
BUN: 14 mg/dL (ref 6–23)
CO2: 26 mEq/L (ref 19–32)
Calcium: 8.9 mg/dL (ref 8.4–10.5)
Chloride: 106 mEq/L (ref 96–112)
Creatinine, Ser: 1 mg/dL (ref 0.4–1.5)
GFR: 91.32 mL/min (ref >60.00)
Glucose, Bld: 144 mg/dL — ABNORMAL HIGH (ref 70–99)
Potassium: 3.6 mEq/L (ref 3.5–5.1)
SODIUM: 139 meq/L (ref 135–145)

## 2014-08-15 LAB — TSH: TSH: 1.12 u[IU]/mL (ref 0.35–4.50)

## 2014-08-15 LAB — MICROALBUMIN / CREATININE URINE RATIO
CREATININE, U: 202.8 mg/dL
MICROALB/CREAT RATIO: 1.3 mg/g (ref 0.0–30.0)
Microalb, Ur: 2.6 mg/dL — ABNORMAL HIGH (ref 0.0–1.9)

## 2014-08-15 LAB — LIPID PANEL
CHOL/HDL RATIO: 5
Cholesterol: 217 mg/dL — ABNORMAL HIGH (ref 0–200)
HDL: 39.7 mg/dL (ref >39.00)
NONHDL: 177.3
Triglycerides: 227 mg/dL — ABNORMAL HIGH (ref 0.0–149.0)
VLDL: 45.4 mg/dL — ABNORMAL HIGH (ref 0.0–40.0)

## 2014-08-15 LAB — LDL CHOLESTEROL, DIRECT: Direct LDL: 129.8 mg/dL

## 2014-08-15 NOTE — Assessment & Plan Note (Signed)
Chronic problem.  On Fenofibrate due to elevated triglycerides.  Not currently on statin.  Check labs and start meds prn.  Pt expressed understanding and is in agreement w/ plan.

## 2014-08-15 NOTE — Patient Instructions (Addendum)
Follow up in 3-4 months to recheck diabetes We'll notify you of your lab results and make any changes if needed Try and make healthy food choices and get regular exercise If you have protein in your urine, we'll start a low dose BP med to protect the kidneys Start OTC Compound W for wart treatment and then apply duct tape to area to cover and speed treatment [facility] the leg and back pain due to your hip replacement Call with any questions or concerns Happy Fall!!!

## 2014-08-15 NOTE — Progress Notes (Signed)
Pre visit review using our clinic review tool, if applicable. No additional management support is needed unless otherwise documented below in the visit note. 

## 2014-08-15 NOTE — Assessment & Plan Note (Signed)
Ongoing issue for pt.  No longer following w/ Endo b/c CBGs were well controlled.  UTD on eye exam.  No neuropathy of hands/feet.  Not on ACE/ARB- due for microalbumin.  Check labs.  Adjust meds prn

## 2014-08-15 NOTE — Progress Notes (Signed)
   Subjective:    Patient ID: Frederick MillinChristopher R Droke, male    DOB: 01/06/1971, 43 y.o.   MRN: [id number]  HPI DM- chronic problem, has been following w/ Dr Sharl MaKerr.  Due for A1C.  On Victoza.  Not on ACE/ARB.  Pt reports to 'not doing what I should for the last few months'.  UTD on eye exam (12/13/13)  No neuropathy of hands/feet.  Denies symptomatic lows.  No CP, SOB, HAs, visual changes, edema, abd pain, N/V/D.  Hyperlipidemia- ongoing issue for pt.  On Fenofibrate due to trigs of 466 in April.  Asymptomatic.  Warts- pt has 2 flat warts on L hand near thumb and 1 on R hand on 3rd knuckle   Review of Systems For ROS see HPI     Objective:   Physical Exam  Vitals reviewed. Constitutional: He is oriented to person, place, and time. He appears well-developed and well-nourished. No distress.  HENT:  Head: Normocephalic and atraumatic.  Eyes: Conjunctivae and EOM are normal. Pupils are equal, round, and reactive to light.  Neck: Normal range of motion. Neck supple. No thyromegaly present.  Cardiovascular: Normal rate, regular rhythm, normal heart sounds and intact distal pulses.   No murmur heard. Pulmonary/Chest: Effort normal and breath sounds normal. No respiratory distress.  Abdominal: Soft. Bowel sounds are normal. He exhibits no distension.  Musculoskeletal: He exhibits no edema.  Lymphadenopathy:    He has no cervical adenopathy.  Neurological: He is alert and oriented to person, place, and time. No cranial nerve deficit.  Skin: Skin is warm and dry.  2 flat warts on L thumb, 1 raised wart on R 3rd knuckle  Psychiatric: He has a normal mood and affect. His behavior is normal.          Assessment & Plan:

## 2014-08-15 NOTE — Assessment & Plan Note (Signed)
New.  Reviewed OTC treatment w/ compound W and duct tape.  Pt expressed understanding and is in agreement w/ plan.

## 2014-08-16 ENCOUNTER — Other Ambulatory Visit: Payer: Self-pay | Admitting: Family Medicine

## 2014-08-16 DIAGNOSIS — R809 Proteinuria, unspecified: Secondary | ICD-10-CM

## 2014-08-16 DIAGNOSIS — R7989 Other specified abnormal findings of blood chemistry: Secondary | ICD-10-CM

## 2014-08-16 DIAGNOSIS — R945 Abnormal results of liver function studies: Principal | ICD-10-CM

## 2014-08-16 MED ORDER — LISINOPRIL 5 MG PO TABS: 5.0000 mg | ORAL_TABLET | Freq: Every day | ORAL | Status: DC

## 2014-08-29 ENCOUNTER — Other Ambulatory Visit (INDEPENDENT_AMBULATORY_CARE_PROVIDER_SITE_OTHER): Payer: BC Managed Care – PPO

## 2014-08-29 DIAGNOSIS — R7989 Other specified abnormal findings of blood chemistry: Secondary | ICD-10-CM

## 2014-08-29 DIAGNOSIS — R945 Abnormal results of liver function studies: Principal | ICD-10-CM

## 2014-08-29 DIAGNOSIS — R809 Proteinuria, unspecified: Secondary | ICD-10-CM

## 2014-08-29 LAB — BASIC METABOLIC PANEL
BUN: 14 mg/dL (ref 6–23)
CALCIUM: 9.1 mg/dL (ref 8.4–10.5)
CO2: 25 mEq/L (ref 19–32)
Chloride: 103 mEq/L (ref 96–112)
Creatinine, Ser: 1.1 mg/dL (ref 0.4–1.5)
GFR: 81.35 mL/min (ref >60.00)
Glucose, Bld: 175 mg/dL — ABNORMAL HIGH (ref 70–99)
Potassium: 3.9 mEq/L (ref 3.5–5.1)
Sodium: 136 mEq/L (ref 135–145)

## 2014-08-29 LAB — HEPATIC FUNCTION PANEL
ALT: 70 U/L — ABNORMAL HIGH (ref 0–53)
AST: 65 U/L — AB (ref 0–37)
Albumin: 3.7 g/dL (ref 3.5–5.2)
Alkaline Phosphatase: 73 U/L (ref 39–117)
Bilirubin, Direct: 0 mg/dL (ref 0.0–0.3)
TOTAL PROTEIN: 7.2 g/dL (ref 6.0–8.3)
Total Bilirubin: 0.8 mg/dL (ref 0.2–1.2)

## 2014-08-30 ENCOUNTER — Encounter: Payer: Self-pay | Admitting: Family Medicine

## 2014-08-30 DIAGNOSIS — R945 Abnormal results of liver function studies: Principal | ICD-10-CM

## 2014-08-30 DIAGNOSIS — R7989 Other specified abnormal findings of blood chemistry: Secondary | ICD-10-CM

## 2014-09-03 ENCOUNTER — Encounter: Payer: Self-pay | Admitting: Family Medicine

## 2014-09-04 ENCOUNTER — Ambulatory Visit (HOSPITAL_BASED_OUTPATIENT_CLINIC_OR_DEPARTMENT_OTHER): Payer: BC Managed Care – PPO

## 2014-09-16 ENCOUNTER — Ambulatory Visit (INDEPENDENT_AMBULATORY_CARE_PROVIDER_SITE_OTHER): Payer: BC Managed Care – PPO | Admitting: Physician Assistant

## 2014-09-16 ENCOUNTER — Encounter: Payer: Self-pay | Admitting: Physician Assistant

## 2014-09-16 ENCOUNTER — Ambulatory Visit (HOSPITAL_BASED_OUTPATIENT_CLINIC_OR_DEPARTMENT_OTHER)
Admission: RE | Admit: 2014-09-16 | Discharge: 2014-09-16 | Disposition: A | Discharge status: Home / Self Care | Payer: BC Managed Care – PPO | Source: Ambulatory Visit | Attending: Physician Assistant | Admitting: Physician Assistant

## 2014-09-16 VITALS — BP 137/78 | HR 93 | Temp 98.2°F | Resp 16 | Ht 70.0 in | Wt 164.4 lb

## 2014-09-16 DIAGNOSIS — M25532 Pain in left wrist: Secondary | ICD-10-CM | POA: Diagnosis not present

## 2014-09-16 NOTE — Patient Instructions (Signed)
Please go downstairs for imaging.  I will call you with your results.  Wear bracing over the next week.  Elevate arm.  Tylenol for pain.  Will know more once x-ray results are in.

## 2014-09-16 NOTE — Progress Notes (Signed)
Pre visit review using our clinic review tool, if applicable. No additional management support is needed unless otherwise documented below in the visit note/SLS  

## 2014-09-16 NOTE — Progress Notes (Signed)
Patient presents to clinic today c/o intermittent left wrist pain and popping with ROM over the past 2 weeks.  Denies trauma or injury.  Denies numbness or weakness of extremity.  Denies erythema, bruising or swelling.  Has history of OA of hip. Denies history of gout or RA.  Past Medical History  Diagnosis Date  . Diabetes mellitus   . HIV (human immunodeficiency virus infection)   . Hypercholesterolemia   . Neuropathy   . GERD (gastroesophageal reflux disease)   . Hepatitis     hepatitis B  . Cholecystitis chronic   . Depression   . Cancer   . Seizures     as a child, none since age [age]    Current Outpatient Prescriptions on File Prior to Visit  Medication Sig Dispense Refill  . aspirin 81 MG tablet Take 81 mg by mouth daily.    . ATRIPLA 600-200-300 MG per tablet TAKE 1 TABLET BY MOUTH AT BEDTIME 90 tablet 2  . BAYER MICROLET LANCETS lancets by Other route 3 (three) times daily as needed.     . BD PEN NEEDLE NANO U/F 32G X 4 MM MISC Inject 24 Units into the skin daily.    . fenofibrate 160 MG tablet Take 1 tablet (160 mg total) by mouth daily. 90 tablet 1  . glucose blood test strip 1 each by Other route 3 (three) times daily as needed. Use as directed.    Marland Kitchen. lisinopril (PRINIVIL,ZESTRIL) 5 MG tablet Take 1 tablet (5 mg total) by mouth daily. 30 tablet 6  . loratadine (CLARITIN) 10 MG tablet Take 10 mg by mouth daily.    . ranitidine (ZANTAC) 150 MG tablet Take 150 mg by mouth daily with breakfast.     . triamcinolone ointment (KENALOG) 0.1 % Apply topically 2 (two) times daily. 90 g 1  . VICTOZA 18 MG/3ML SOLN injection Inject 1.8 mg into the skin daily.     No current facility-administered medications on file prior to visit.    Allergies  Allergen Reactions  . Metformin And Related Nausea And Vomiting  . Phenytoin     Family History  Problem Relation Age of Onset  . Cancer Mother     breast  . Cancer Maternal Grandmother     melanoma-nose  . Cancer Paternal  Grandfather     melanoma-ear    History   Social History  . Marital Status: Single    Spouse Name: N/A    Number of Children: N/A  . Years of Education: N/A   Social History Main Topics  . Smoking status: Former Smoker -- 0.80 packs/day for 20 years    Types: Cigarettes    Quit date: 05/06/2006  . Smokeless tobacco: Never Used  . Alcohol Use: 0.5 oz/week    1 drink(s) per week     Comment: 1-2 drinks every week  . Drug Use: Yes    Special: Marijuana  . Sexual Activity:    Partners: Male     Comment: declined condoms   Other Topics Concern  . None   Social History Narrative    Review of Systems - See HPI.  All other ROS are negative.  BP 137/78 mmHg  Pulse 93  Temp(Src) 98.2 F (36.8 C) (Other (Comment))  Resp 16  Ht 5\' 10"  (1.778 m)  Wt 164 lb 6 oz (74.56 kg)  BMI 23.59 kg/m2  SpO2 100%  Physical Exam  Constitutional: He is oriented to person, place, and time and  well-developed, well-nourished, and in no distress.  HENT:  Head: Normocephalic and atraumatic.  Eyes: Conjunctivae are normal. Pupils are equal, round, and reactive to light.  Cardiovascular: Normal rate, regular rhythm, normal heart sounds and intact distal pulses.   Pulmonary/Chest: Effort normal and breath sounds normal. No respiratory distress. He has no wheezes. He has no rales. He exhibits no tenderness.  Musculoskeletal:       Left elbow: Normal.       Right wrist: Normal.       Left wrist: He exhibits tenderness. He exhibits normal range of motion, no bony tenderness, no swelling, no effusion, no crepitus, no deformity and no laceration.  Neurological: He is alert and oriented to person, place, and time.  Skin: Skin is warm and dry. No rash noted.  Psychiatric: Affect normal.  Vitals reviewed.   Recent Results (from the past 2160 hour(s))  Hemoglobin A1c     Status: Abnormal   Collection Time: 08/15/14 10:03 AM  Result Value Ref Range   Hgb A1c MFr Bld 7.8 (H) 4.6 - 6.5 %    Comment:  Glycemic Control Guidelines for People with Diabetes:Non Diabetic:  <6%Goal of Therapy: <7%Additional Action Suggested:  >8%   Lipid panel     Status: Abnormal   Collection Time: 08/15/14 10:03 AM  Result Value Ref Range   Cholesterol 217 (H) 0 - 200 mg/dL    Comment: ATP III Classification       Desirable:  < 200 mg/dL               Borderline High:  200 - 239 mg/dL          High:  > = 130240 mg/dL   Triglycerides 865.7227.0 (H) 0.0 - 149.0 mg/dL    Comment: Normal:  <846<150 mg/dLBorderline High:  150 - 199 mg/dL   HDL 96.2939.70 >52.84>39.00 mg/dL   VLDL 13.245.4 (H) 0.0 - 44.040.0 mg/dL   Total CHOL/HDL Ratio 5     Comment:                Men          Women1/2 Average Risk     3.4          3.3Average Risk          5.0          4.42X Average Risk          9.6          7.13X Average Risk          15.0          11.0                       NonHDL 177.30     Comment: NOTE:  Non-HDL goal should be 30 mg/dL higher than patient's LDL goal (i.e. LDL goal of < 70 mg/dL, would have non-HDL goal of < 100 mg/dL)  Basic metabolic panel     Status: Abnormal   Collection Time: 08/15/14 10:03 AM  Result Value Ref Range   Sodium 139 135 - 145 mEq/L   Potassium 3.6 3.5 - 5.1 mEq/L   Chloride 106 96 - 112 mEq/L   CO2 26 19 - 32 mEq/L   Glucose, Bld 144 (H) 70 - 99 mg/dL   BUN 14 6 - 23 mg/dL   Creatinine, Ser 1.0 0.4 - 1.5 mg/dL   Calcium 8.9 8.4 - 10.210.5 mg/dL   GFR 72.5391.32 >66.44>60.00  mL/min  Hepatic function panel     Status: Abnormal   Collection Time: 08/15/14 10:03 AM  Result Value Ref Range   Total Bilirubin 0.5 0.2 - 1.2 mg/dL   Bilirubin, Direct 0.0 0.0 - 0.3 mg/dL   Alkaline Phosphatase 73 39 - 117 U/L   AST 72 (H) 0 - 37 U/L   ALT 69 (H) 0 - 53 U/L   Total Protein 7.4 6.0 - 8.3 g/dL   Albumin 3.9 3.5 - 5.2 g/dL  TSH     Status: None   Collection Time: 08/15/14 10:03 AM  Result Value Ref Range   TSH 1.12 0.35 - 4.50 uIU/mL  Urine Microalbumin w/creat. ratio     Status: Abnormal   Collection Time: 08/15/14 10:03 AM    Result Value Ref Range   Microalb, Ur 2.6 (H) 0.0 - 1.9 mg/dL   Creatinine,U 161.0 mg/dL   Microalb Creat Ratio 1.3 0.0 - 30.0 mg/g  LDL cholesterol, direct     Status: None   Collection Time: 08/15/14 10:03 AM  Result Value Ref Range   Direct LDL 129.8 mg/dL    Comment: Optimal:  <960 mg/dLNear or Above Optimal:  100-129 mg/dLBorderline High:  130-159 mg/dLHigh:  160-189 mg/dLVery High:  >190 mg/dL  Hepatic function panel     Status: Abnormal   Collection Time: 08/29/14  9:11 AM  Result Value Ref Range   Total Bilirubin 0.8 0.2 - 1.2 mg/dL   Bilirubin, Direct 0.0 0.0 - 0.3 mg/dL   Alkaline Phosphatase 73 39 - 117 U/L   AST 65 (H) 0 - 37 U/L   ALT 70 (H) 0 - 53 U/L   Total Protein 7.2 6.0 - 8.3 g/dL   Albumin 3.7 3.5 - 5.2 g/dL  Basic metabolic panel     Status: Abnormal   Collection Time: 08/29/14  9:11 AM  Result Value Ref Range   Sodium 136 135 - 145 mEq/L   Potassium 3.9 3.5 - 5.1 mEq/L   Chloride 103 96 - 112 mEq/L   CO2 25 19 - 32 mEq/L   Glucose, Bld 175 (H) 70 - 99 mg/dL   BUN 14 6 - 23 mg/dL   Creatinine, Ser 1.1 0.4 - 1.5 mg/dL   Calcium 9.1 8.4 - 45.4 mg/dL   GFR 09.81 >19.14 mL/min    Assessment/Plan: Left wrist pain Will obtain x-ray to further assess. Possible OA.  Negative Tinel and Phalen sign.  Negative Finklestein. Pain is with ROM of joint. Strength is good. Sensation intact.  RICE therapy.  Bracing given to wear over the next week.  Tylenol for pain.  Follow-up 1 week if symptoms are not improving and x-ray unremarkable.

## 2014-09-16 NOTE — Assessment & Plan Note (Signed)
Will obtain x-ray to further assess. Possible OA.  Negative Tinel and Phalen sign.  Negative Finklestein. Pain is with ROM of joint. Strength is good. Sensation intact.  RICE therapy.  Bracing given to wear over the next week.  Tylenol for pain.  Follow-up 1 week if symptoms are not improving and x-ray unremarkable.

## 2014-09-19 ENCOUNTER — Other Ambulatory Visit: Payer: BC Managed Care – PPO

## 2014-09-19 DIAGNOSIS — B20 Human immunodeficiency virus [HIV] disease: Secondary | ICD-10-CM

## 2014-09-20 ENCOUNTER — Encounter: Payer: Self-pay | Admitting: Family Medicine

## 2014-09-20 LAB — HIV-1 RNA QUANT-NO REFLEX-BLD: HIV-1 RNA Quant, Log: <1.3 {Log} (ref <1.30)

## 2014-09-20 LAB — T-HELPER CELL (CD4) - (RCID CLINIC ONLY)
CD4 % Helper T Cell: 36 % (ref 33–55)
CD4 T CELL ABS: 1210 /uL (ref 400–2700)

## 2014-10-10 ENCOUNTER — Ambulatory Visit (INDEPENDENT_AMBULATORY_CARE_PROVIDER_SITE_OTHER): Payer: BLUE CROSS/BLUE SHIELD | Admitting: Internal Medicine

## 2014-10-10 DIAGNOSIS — B20 Human immunodeficiency virus [HIV] disease: Secondary | ICD-10-CM

## 2014-10-10 NOTE — Progress Notes (Signed)
Patient ID: Frederick MillinChristopher R Abbott, male   DOB: 10/18/1971, 43 y.o.   MRN: [id number]          Patient Active Problem List   Diagnosis Date Noted  . DM II (diabetes mellitus, type II), controlled 12/11/2009    Priority: High  . Hyperlipidemia 08/20/2008    Priority: High  . Human immunodeficiency virus (HIV) disease 06/01/2007    Priority: High  . Left wrist pain 09/16/2014  . Wart 08/15/2014  . Eustachian tube dysfunction 08/09/2013  . UTI (urinary tract infection) 08/09/2013  . Routine general medical examination at a health care facility 02/07/2013  . Dyshidrotic eczema 02/07/2013  . Chronic cholecystitis with calculus 05/09/2012  . Heel pain 09/23/2011  . DECREASED HEARING, BILATERAL 09/18/2009  . LOW BACK PAIN, CHRONIC 10/17/2008  . VENEREAL WART 08/20/2008  . GERD 06/05/2007  . ANXIETY 06/01/2007  . DEPRESSION 06/01/2007  . ALLERGIC RHINITIS 06/01/2007  . CARBUNCLE/FURUNCLE NOS 06/01/2007  . PSORIASIS NEC 06/01/2007  . NECROSIS, ASEPTIC, FEMUR HEAD/NECK 06/01/2007  . SEIZURE DISORDER 06/01/2007  . HEPATITIS B, HX OF 06/01/2007    Patient's Medications  New Prescriptions   No medications on file  Previous Medications   ASPIRIN 81 MG TABLET    Take 81 mg by mouth daily.   ATRIPLA 600-200-300 MG PER TABLET    TAKE 1 TABLET BY MOUTH AT BEDTIME   BAYER MICROLET LANCETS LANCETS    by Other route 3 (three) times daily as needed.    BD PEN NEEDLE NANO U/F 32G X 4 MM MISC    Inject 24 Units into the skin daily.   FENOFIBRATE 160 MG TABLET    Take 1 tablet (160 mg total) by mouth daily.   GLUCOSE BLOOD TEST STRIP    1 each by Other route 3 (three) times daily as needed. Use as directed.   LISINOPRIL (PRINIVIL,ZESTRIL) 5 MG TABLET    Take 1 tablet (5 mg total) by mouth daily.   LORATADINE (CLARITIN) 10 MG TABLET    Take 10 mg by mouth daily.   RANITIDINE (ZANTAC) 150 MG TABLET    Take 150 mg by mouth daily with breakfast.    TRIAMCINOLONE OINTMENT (KENALOG) 0.1 %    Apply  topically 2 (two) times daily.   VICTOZA 18 MG/3ML SOLN INJECTION    Inject 1.8 mg into the skin daily.  Modified Medications   No medications on file  Discontinued Medications   No medications on file    Subjective: Frederick Abbott is in for his routine visit. He denies missing any doses of Atripla. He's been bothered by some right wrist pain recently but otherwise is feeling well. His wrist feels better if he wears his brace at night when asleep. He recently started taking Plexus Slim and Plexus Accelerator weight loss supplements. Review of Systems: Pertinent items are noted in HPI.  Past Medical History  Diagnosis Date  . Diabetes mellitus   . HIV (human immunodeficiency virus infection)   . Hypercholesterolemia   . Neuropathy   . GERD (gastroesophageal reflux disease)   . Hepatitis     hepatitis B  . Cholecystitis chronic   . Depression   . Cancer   . Seizures     as a child, none since age 89    History  Substance Use Topics  . Smoking status: Former Smoker -- 0.80 packs/day for 20 years    Types: Cigarettes    Quit date: 05/06/2006  . Smokeless tobacco: Never Used  .  Alcohol Use: 0.5 oz/week    1 drink(s) per week     Comment: 1-2 drinks every week    Family History  Problem Relation Age of Onset  . Cancer Mother     breast  . Cancer Maternal Grandmother     melanoma-nose  . Cancer Paternal Grandfather     melanoma-ear    Allergies  Allergen Reactions  . Metformin And Related Nausea And Vomiting  . Phenytoin     Objective:   Body mass index is 21.81 kg/(m^2).  General: He is in good spirits Oral: No oral lesions Skin: No rash Lungs: Clear Cor: Regular S1 and S2 with no murmurs Joints and extremities: He has a brace on his left wrist. He has mild amount of discomfort with range of motion of his wrist.  Lab Results Lab Results  Component Value Date   WBC 9.1 02/25/2014   HGB 17.0 02/25/2014   HCT 46.7 02/25/2014   MCV 84.4 02/25/2014   PLT 311  02/25/2014    Lab Results  Component Value Date   CREATININE 1.1 08/29/2014   BUN 14 08/29/2014   NA 136 08/29/2014   K 3.9 08/29/2014   CL 103 08/29/2014   CO2 25 08/29/2014    Lab Results  Component Value Date   ALT 70* 08/29/2014   AST 65* 08/29/2014   ALKPHOS 73 08/29/2014   BILITOT 0.8 08/29/2014    Lab Results  Component Value Date   CHOL 217* 08/15/2014   HDL 39.70 08/15/2014   LDLCALC 92 02/11/2014   LDLDIRECT 129.8 08/15/2014   TRIG 227.0* 08/15/2014   CHOLHDL 5 08/15/2014    Lab Results HIV 1 RNA QUANT (copies/mL)  Date Value  09/19/2014 <20  02/25/2014 22*  08/09/2013 <20   CD4 T CELL ABS  Date Value  09/19/2014 1210 /uL  02/25/2014 1180 /uL  03/16/2012 930 cmm     Assessment: His HIV infection remains under excellent control. I will review the ingredients in his weight loss supplements with our ID pharmacists to help determine if there might be adverse drug drug interactions with Atripla.  Plan: 1. Continue Atripla 2. Follow-up in 6 months   Cliffton AstersJohn Tanelle Lanzo, MD [facility] [phone] pager   [phone] cell 10/10/2014, 2:14 PM

## 2014-10-29 ENCOUNTER — Other Ambulatory Visit: Payer: Self-pay | Admitting: General Practice

## 2014-10-29 MED ORDER — LISINOPRIL 5 MG PO TABS: 5.0000 mg | ORAL_TABLET | Freq: Every day | ORAL | Status: DC

## 2014-10-30 ENCOUNTER — Other Ambulatory Visit: Payer: Self-pay | Admitting: General Practice

## 2014-10-30 MED ORDER — LISINOPRIL 5 MG PO TABS: 5.0000 mg | ORAL_TABLET | Freq: Every day | ORAL | Status: DC

## 2014-11-05 ENCOUNTER — Telehealth: Payer: Self-pay | Admitting: Family Medicine

## 2014-11-05 MED ORDER — LISINOPRIL 5 MG PO TABS: 5.0000 mg | ORAL_TABLET | Freq: Every day | ORAL | Status: DC

## 2014-11-05 NOTE — Telephone Encounter (Signed)
Caller name: Marcellina MillinJoyce, Carles R Relation to pt: self  Call back number: [phone] [id number] [place] - [address] [phone] (Phone)     Reason for call:  Pt requesting a 3 month supply lisinopril (PRINIVIL,ZESTRIL) 5 MG tablet pt states it has to be 3 month supply or pharmacy will not fill.

## 2014-11-05 NOTE — Telephone Encounter (Signed)
Medication filled.  

## 2014-12-16 ENCOUNTER — Ambulatory Visit (HOSPITAL_BASED_OUTPATIENT_CLINIC_OR_DEPARTMENT_OTHER)
Admission: RE | Admit: 2014-12-16 | Discharge: 2014-12-16 | Disposition: A | Discharge status: Home / Self Care | Payer: BLUE CROSS/BLUE SHIELD | Source: Ambulatory Visit | Attending: Family Medicine | Admitting: Family Medicine

## 2014-12-16 ENCOUNTER — Encounter: Payer: Self-pay | Admitting: Family Medicine

## 2014-12-16 ENCOUNTER — Ambulatory Visit (INDEPENDENT_AMBULATORY_CARE_PROVIDER_SITE_OTHER): Payer: BLUE CROSS/BLUE SHIELD | Admitting: Family Medicine

## 2014-12-16 VITALS — BP 124/80 | HR 96 | Temp 98.0°F | Resp 16 | Wt 162.1 lb

## 2014-12-16 DIAGNOSIS — R945 Abnormal results of liver function studies: Secondary | ICD-10-CM

## 2014-12-16 DIAGNOSIS — R7989 Other specified abnormal findings of blood chemistry: Secondary | ICD-10-CM

## 2014-12-16 DIAGNOSIS — E119 Type 2 diabetes mellitus without complications: Secondary | ICD-10-CM

## 2014-12-16 DIAGNOSIS — Z21 Asymptomatic human immunodeficiency virus [HIV] infection status: Secondary | ICD-10-CM | POA: Diagnosis not present

## 2014-12-16 DIAGNOSIS — R05 Cough: Secondary | ICD-10-CM | POA: Insufficient documentation

## 2014-12-16 DIAGNOSIS — R059 Cough, unspecified: Secondary | ICD-10-CM

## 2014-12-16 DIAGNOSIS — R112 Nausea with vomiting, unspecified: Secondary | ICD-10-CM | POA: Insufficient documentation

## 2014-12-16 DIAGNOSIS — H6982 Other specified disorders of Eustachian tube, left ear: Secondary | ICD-10-CM

## 2014-12-16 LAB — BASIC METABOLIC PANEL
BUN: 14 mg/dL (ref 6–23)
CO2: 26 meq/L (ref 19–32)
Calcium: 9.1 mg/dL (ref 8.4–10.5)
Chloride: 104 mEq/L (ref 96–112)
Creatinine, Ser: 0.99 mg/dL (ref 0.40–1.50)
GFR: 86.94 mL/min (ref >60.00)
Glucose, Bld: 171 mg/dL — ABNORMAL HIGH (ref 70–99)
POTASSIUM: 4.2 meq/L (ref 3.5–5.1)
SODIUM: 137 meq/L (ref 135–145)

## 2014-12-16 LAB — MICROALBUMIN / CREATININE URINE RATIO
Creatinine,U: 156.9 mg/dL
MICROALB UR: 1.8 mg/dL (ref 0.0–1.9)
Microalb Creat Ratio: 1.1 mg/g (ref 0.0–30.0)

## 2014-12-16 LAB — HEPATIC FUNCTION PANEL
ALBUMIN: 4.3 g/dL (ref 3.5–5.2)
ALT: 42 U/L (ref 0–53)
AST: 38 U/L — AB (ref 0–37)
Alkaline Phosphatase: 79 U/L (ref 39–117)
Bilirubin, Direct: 0.1 mg/dL (ref 0.0–0.3)
Total Bilirubin: 0.5 mg/dL (ref 0.2–1.2)
Total Protein: 7 g/dL (ref 6.0–8.3)

## 2014-12-16 LAB — H. PYLORI ANTIBODY, IGG: H Pylori IgG: NEGATIVE

## 2014-12-16 LAB — HEMOGLOBIN A1C: Hgb A1c MFr Bld: 7.2 % — ABNORMAL HIGH (ref 4.6–6.5)

## 2014-12-16 MED ORDER — ONDANSETRON HCL 4 MG PO TABS: 4.0000 mg | ORAL_TABLET | Freq: Three times a day (TID) | ORAL | Status: DC | PRN

## 2014-12-16 MED ORDER — PANTOPRAZOLE SODIUM 40 MG PO TBEC: 40.0000 mg | DELAYED_RELEASE_TABLET | Freq: Every day | ORAL | Status: DC

## 2014-12-16 NOTE — Progress Notes (Signed)
Pre visit review using our clinic review tool, if applicable. No additional management support is needed unless otherwise documented below in the visit note. 

## 2014-12-16 NOTE — Progress Notes (Signed)
   Subjective:    Patient ID: Frederick Abbott, male    DOB: 06/30/1971, 44 y.o.   MRN: [id number]  HPI DM- chronic problem.  On Victoza 1.8 mg daily.  Started on Lisinopril in October due to microalbuminuria.  Due for eye exam.  Has had 6 episodes of vomiting independent of illness.  Increased GERD despite Zantac 150mg  daily.  + diarrhea w/ the vomiting.  Pt reports CBGs have been labile.  Pt has been having intermittent lows, lowest was 38, a few in the 50s, some over 200.  URI- 'head cold' for 4 days.  No fevers.  + sinus pressure w/ 'gooey green' nasal drainage.  + cough.  + sick contacts.  Pt is concerned that cough could be cancer.  Also reports he has constant 'fluttering' in L ear- would like to see ENT for this  Elevated LFTs- pt did not have US done of liver.     Review of Systems For ROS see HPI     Objective:   Physical Exam  Constitutional: He is oriented to person, place, and time. He appears well-developed and well-nourished. No distress.  HENT:  Head: Normocephalic and atraumatic.  TMs WNL bilaterally No TTP over sinuses + turbinate edema Copious PND  Eyes: Conjunctivae and EOM are normal. Pupils are equal, round, and reactive to light.  Neck: Normal range of motion. Neck supple. No thyromegaly present.  Cardiovascular: Normal rate, regular rhythm, normal heart sounds and intact distal pulses.   No murmur heard. Pulmonary/Chest: Effort normal and breath sounds normal. No respiratory distress.  Dry cough  Abdominal: Soft. Bowel sounds are normal. He exhibits no distension. There is no tenderness. There is no rebound.  Possible hepatomegaly  Musculoskeletal: He exhibits no edema.  Lymphadenopathy:    He has no cervical adenopathy.  Neurological: He is alert and oriented to person, place, and time. No cranial nerve deficit.  Skin: Skin is warm and dry.  Psychiatric: He has a normal mood and affect. His behavior is normal.  Vitals reviewed.         Assessment  & Plan:

## 2014-12-16 NOTE — Patient Instructions (Signed)
Follow up in 3-4 months to recheck diabetes- sooner if needed Go downstairs and get your chest xray done- we'll notify you of the results We'll notify you of your lab results and make any changes if needed START the protonix daily to decrease acid production Use the Zofran as needed for nausea We'll call you with your GI and ENT referral Make sure you are taking Claritin/Zyrtec daily for the congestion and post-nasal drip (this is common cause of cough- as is reflux) Drink plenty of fluids REST! Call with any questions or concerns Hang in there!!

## 2014-12-17 ENCOUNTER — Encounter: Payer: Self-pay | Admitting: Family Medicine

## 2014-12-17 ENCOUNTER — Telehealth: Payer: Self-pay | Admitting: *Deleted

## 2014-12-17 NOTE — Assessment & Plan Note (Signed)
Chronic problem.  Pt was previously following w/ Endo but was released from care due to good control.  UTD on eye exam.  Now on ACE due to microalbuminuria this fall.  Check labs.  Adjust meds prn

## 2014-12-17 NOTE — Assessment & Plan Note (Signed)
New.  Get CXR to assess for atypical infxn in setting of HIV.  Suspect cough is due to combo of PND and GERD- start OTC antihistamine daily in addition to PPI.  Will follow.

## 2014-12-17 NOTE — Assessment & Plan Note (Signed)
Noted on previous labs.  Pt did not have US as recommended.  Did not start statin at last visit due to abnormal labs.  Repeat labs today.

## 2014-12-17 NOTE — Telephone Encounter (Signed)
Prior authorization initiated for ondansetron. Awaiting determination. JG//CMA

## 2014-12-17 NOTE — Assessment & Plan Note (Signed)
Recurring problem for pt.  Refer to ENT at pt's request

## 2014-12-17 NOTE — Assessment & Plan Note (Signed)
New to provider, unclear of cause.  Suspect a component of excess acid production w/ possible gastritis.  Start PPI.  Check labs to r/o infxn, biliary abnormality.  Refer to GI.  Will follow.

## 2014-12-18 LAB — HM DIABETES EYE EXAM

## 2014-12-20 ENCOUNTER — Other Ambulatory Visit (INDEPENDENT_AMBULATORY_CARE_PROVIDER_SITE_OTHER): Payer: BLUE CROSS/BLUE SHIELD

## 2014-12-20 DIAGNOSIS — E785 Hyperlipidemia, unspecified: Secondary | ICD-10-CM

## 2014-12-20 LAB — LIPID PANEL
CHOL/HDL RATIO: 4
Cholesterol: 172 mg/dL (ref 0–200)
HDL: 40 mg/dL (ref >39.00)
LDL Cholesterol: 94 mg/dL (ref 0–99)
NonHDL: 132
Triglycerides: 192 mg/dL — ABNORMAL HIGH (ref 0.0–149.0)
VLDL: 38.4 mg/dL (ref 0.0–40.0)

## 2014-12-26 ENCOUNTER — Encounter: Payer: Self-pay | Admitting: General Practice

## 2014-12-27 ENCOUNTER — Ambulatory Visit (INDEPENDENT_AMBULATORY_CARE_PROVIDER_SITE_OTHER): Payer: BLUE CROSS/BLUE SHIELD | Admitting: Gastroenterology

## 2014-12-27 ENCOUNTER — Encounter: Payer: Self-pay | Admitting: Gastroenterology

## 2014-12-27 ENCOUNTER — Other Ambulatory Visit: Payer: BLUE CROSS/BLUE SHIELD

## 2014-12-27 VITALS — BP 118/78 | HR 80 | Resp 14 | Ht 70.75 in | Wt 161.8 lb

## 2014-12-27 DIAGNOSIS — R1114 Bilious vomiting: Secondary | ICD-10-CM

## 2014-12-27 DIAGNOSIS — K625 Hemorrhage of anus and rectum: Secondary | ICD-10-CM

## 2014-12-27 DIAGNOSIS — R7989 Other specified abnormal findings of blood chemistry: Secondary | ICD-10-CM

## 2014-12-27 DIAGNOSIS — R945 Abnormal results of liver function studies: Principal | ICD-10-CM

## 2014-12-27 NOTE — Assessment & Plan Note (Signed)
Very intermittent symptoms suggest that the patient is having a reaction to something that he has ingested.  Transient obstruction is also a possibility although I think less likely.  The patient was instructed to take a careful dietary history when he is symptomatic to help determine whether there is a pattern of symptoms with certain food ingestion.

## 2014-12-27 NOTE — Assessment & Plan Note (Signed)
The patient has had intermittent painless rectal bleeding.  This may be hemorrhoidal although a more proximal colonic bleeding source should be will doubt.  Recommendations #1 colonoscopy

## 2014-12-27 NOTE — Assessment & Plan Note (Signed)
Asymptomatic minor LFT after maladies could be due to chronic viral hepatitis.  Mild hepatic steatosis has been described by ultrasound which may also account for his LFT abnormalities.    Recommendations #1 check serologies for hepatitis A, B, and C

## 2014-12-27 NOTE — Patient Instructions (Signed)
Go to the basement for labs today We are sending in a prescription to your pharmacy It has been recommended to you by your physician that you have a(n) colonoscopy completed. Per your request, we did not schedule the procedure(s) today. Please contact our office at [phone] should you decide to have the procedure completed.

## 2014-12-27 NOTE — Progress Notes (Addendum)
_                                                                                                                History of Present Illness:  Frederick Abbott is a 44 year old white male referred for evaluation of normal liver tests and intermittent nausea vomiting diarrhea.  The former has been noted on routine testing.  There is a note that he has a history of hepatitis B but he is unaware a minor transaminitis left somewhat a half times normal has been noted.  No history of hepatitis or jaundice.  Less than once a year for the past few years he's had limited episodes of intense nausea vomiting and diarrhea.  This may last hours to less than a day.  He may have crampy abdominal pain.  On occasion he is seen blood mixed with his stools.  He denies rectal pain.   Past Medical History  Diagnosis Date  . Diabetes mellitus   . HIV (human immunodeficiency virus infection)   . Hypercholesterolemia   . Neuropathy   . GERD (gastroesophageal reflux disease)   . Hepatitis     hepatitis B  . Cholecystitis chronic   . Depression   . Cancer   . Seizures     as a child, none since age 1  . Belching   . Diarrhea    Past Surgical History  Procedure Laterality Date  . Tonsillectomy  1973  . Joint replacement      bil total hips  . Cholecystectomy  05/29/2012    Procedure: LAPAROSCOPIC CHOLECYSTECTOMY WITH INTRAOPERATIVE CHOLANGIOGRAM;  Surgeon: Wilmon Arms. Corliss Skains, MD;  Location: [facility];  Service: General;  Laterality: N/A;   family history includes Cancer in his maternal grandmother, mother, and paternal grandfather. Current Outpatient Prescriptions  Medication Sig Dispense Refill  . aspirin 81 MG tablet Take 81 mg by mouth daily.    . ATRIPLA 600-200-300 MG per tablet TAKE 1 TABLET BY MOUTH AT BEDTIME 90 tablet 2  . BD PEN NEEDLE NANO U/F 32G X 4 MM MISC Inject 24 Units into the skin daily.    . fenofibrate 160 MG tablet Take 1 tablet (160 mg total) by mouth daily. 90 tablet 1  .  glucose blood test strip 1 each by Other route 3 (three) times daily as needed. Use as directed.    Marland Kitchen glucose blood test strip 1 each by Other route as needed for other. To test sugars 3 times daily. Ultra Blue test strips    . lisinopril (PRINIVIL,ZESTRIL) 5 MG tablet Take 1 tablet (5 mg total) by mouth daily. 90 tablet 1  . loratadine (CLARITIN) 10 MG tablet Take 10 mg by mouth daily.    [name] DELICA LANCETS 33G MISC by Does not apply route.    . pantoprazole (PROTONIX) 40 MG tablet Take 1 tablet (40 mg total) by mouth daily. 30 tablet 3  . Probiotic Product (PROBIOTIC DAILY PO) Take by mouth.    . triamcinolone ointment (KENALOG) 0.1 %  Apply topically 2 (two) times daily. 90 g 1  . VICTOZA 18 MG/3ML SOLN injection Inject 1.8 mg into the skin daily.     No current facility-administered medications for this visit.   Allergies as of 12/27/2014 - Review Complete 12/27/2014  Allergen Reaction Noted  . Metformin and related Nausea And Vomiting 05/06/2012  . Phenytoin      reports that he quit smoking about 8 years ago. His smoking use included Cigarettes. He has a 16 pack-year smoking history. He has never used smokeless tobacco. He reports that he drinks about 0.5 oz of alcohol per week. He reports that he uses illicit drugs (Marijuana).   Review of Systems: Pertinent positive and negative review of systems were noted in the above HPI section. All other review of systems were otherwise negative.  Vital signs were reviewed in today's medical record Physical Exam: General: Well developed , well nourished, no acute distress Skin: anicteric Head: Normocephalic and atraumatic Eyes:  sclerae anicteric, EOMI Ears: Normal auditory acuity Mouth: No deformity or lesions Neck: Supple, no masses or thyromegaly Lungs: Clear throughout to auscultation Heart: Regular rate and rhythm; no murmurs, rubs or bruits Abdomen: Soft, non tender and non distended. No masses, hepatosplenomegaly or hernias  noted. Normal Bowel sounds Rectal:deferred Musculoskeletal: Symmetrical with no gross deformities  Skin: No lesions on visible extremities Pulses:  Normal pulses noted Extremities: No clubbing, cyanosis, edema or deformities noted Neurological: Alert oriented x 4, grossly nonfocal Cervical Nodes:  No significant cervical adenopathy Inguinal Nodes: No significant inguinal adenopathy Psychological:  Alert and cooperative. Normal mood and affect  See Assessment and Plan under Problem List

## 2014-12-28 LAB — HEPATITIS B SURFACE ANTIGEN: Hepatitis B Surface Ag: NEGATIVE

## 2014-12-28 LAB — HEPATITIS B SURFACE ANTIBODY,QUALITATIVE: Hep B S Ab: POSITIVE — AB

## 2014-12-28 LAB — HEPATITIS C ANTIBODY: HCV AB: NEGATIVE

## 2014-12-28 LAB — HEPATITIS A ANTIBODY, TOTAL: HEP A TOTAL AB: REACTIVE — AB

## 2014-12-30 ENCOUNTER — Encounter: Payer: Self-pay | Admitting: Family Medicine

## 2014-12-30 ENCOUNTER — Other Ambulatory Visit: Payer: Self-pay

## 2014-12-30 ENCOUNTER — Encounter: Payer: Self-pay | Admitting: Gastroenterology

## 2014-12-30 DIAGNOSIS — R748 Abnormal levels of other serum enzymes: Secondary | ICD-10-CM

## 2014-12-31 ENCOUNTER — Telehealth: Payer: Self-pay | Admitting: Family Medicine

## 2014-12-31 ENCOUNTER — Encounter: Payer: Self-pay | Admitting: Family Medicine

## 2014-12-31 ENCOUNTER — Other Ambulatory Visit: Payer: BLUE CROSS/BLUE SHIELD

## 2014-12-31 ENCOUNTER — Other Ambulatory Visit: Payer: Self-pay | Admitting: *Deleted

## 2014-12-31 DIAGNOSIS — R748 Abnormal levels of other serum enzymes: Secondary | ICD-10-CM

## 2014-12-31 MED ORDER — EFAVIRENZ-EMTRICITAB-TENOFOVIR 600-200-300 MG PO TABS: 1.0000 | ORAL_TABLET | Freq: Every day | ORAL | Status: DC

## 2014-12-31 NOTE — Telephone Encounter (Signed)
I reviewed his labs.  He has immunity to Hep B- which means he has been vaccinated, NOT infected.  Hep A is a diarrheal illness and is fairly common.  He has antibiodies to this which could mean past infection or vaccination.  It is not transmitted like Hep B or C- it is through contaminated food or water (often when traveling or at restaurants) so there is no need for partner to be tested

## 2014-12-31 NOTE — Telephone Encounter (Signed)
Pt notified and advised to contact GI for any further concerns regarding his labs.

## 2014-12-31 NOTE — Telephone Encounter (Signed)
Caller name: Cristal DeerChristopher Relation to pt: self Call back number: [phone] Pharmacy:  Reason for call:   Patient states that he has been diagnosed with Hep A. Also has antibodies for Hep B. Is concerned for his partner whom is also a Dr. Beverely Lowabori patient. Does his partner need to come in to be checked or can this just be a lab visit.

## 2015-01-01 ENCOUNTER — Other Ambulatory Visit: Payer: Self-pay

## 2015-01-01 LAB — HEPATITIS A ANTIBODY, TOTAL: Hep A Total Ab: REACTIVE — AB

## 2015-01-01 LAB — HEPATITIS A ANTIBODY, IGM: Hep A IgM: NONREACTIVE

## 2015-01-05 NOTE — Progress Notes (Signed)
Quick Note:  Please inform the patient that lab work was normal (no evidence for chronic viral hepatitis) and to continue current plan of action ______

## 2015-01-07 ENCOUNTER — Other Ambulatory Visit: Payer: Self-pay | Admitting: Family Medicine

## 2015-01-07 NOTE — Telephone Encounter (Signed)
Last filled: 07/29/14 Amt: 90, 1 refill Last OV:  12/16/14 Labs: 12/20/14  Med filled.

## 2015-02-28 ENCOUNTER — Other Ambulatory Visit: Payer: Self-pay | Admitting: General Practice

## 2015-02-28 MED ORDER — PANTOPRAZOLE SODIUM 40 MG PO TBEC: 40.0000 mg | DELAYED_RELEASE_TABLET | Freq: Every day | ORAL | Status: DC

## 2015-03-11 ENCOUNTER — Encounter: Payer: Self-pay | Admitting: Family Medicine

## 2015-03-12 MED ORDER — PANTOPRAZOLE SODIUM 40 MG PO TBEC: 40.0000 mg | DELAYED_RELEASE_TABLET | Freq: Every day | ORAL | Status: DC

## 2015-03-12 NOTE — Telephone Encounter (Signed)
Med filled.  

## 2015-04-16 ENCOUNTER — Encounter: Payer: Self-pay | Admitting: Family Medicine

## 2015-04-16 ENCOUNTER — Ambulatory Visit (INDEPENDENT_AMBULATORY_CARE_PROVIDER_SITE_OTHER): Payer: BLUE CROSS/BLUE SHIELD | Admitting: Family Medicine

## 2015-04-16 VITALS — BP 118/78 | HR 87 | Temp 98.0°F | Resp 16 | Ht 71.0 in | Wt 164.2 lb

## 2015-04-16 DIAGNOSIS — E119 Type 2 diabetes mellitus without complications: Secondary | ICD-10-CM

## 2015-04-16 DIAGNOSIS — R945 Abnormal results of liver function studies: Secondary | ICD-10-CM

## 2015-04-16 DIAGNOSIS — R7989 Other specified abnormal findings of blood chemistry: Secondary | ICD-10-CM | POA: Diagnosis not present

## 2015-04-16 LAB — HEPATIC FUNCTION PANEL
ALT: 43 U/L (ref 0–53)
AST: 37 U/L (ref 0–37)
Albumin: 4.4 g/dL (ref 3.5–5.2)
Alkaline Phosphatase: 80 U/L (ref 39–117)
BILIRUBIN DIRECT: 0.1 mg/dL (ref 0.0–0.3)
Total Bilirubin: 0.4 mg/dL (ref 0.2–1.2)
Total Protein: 6.8 g/dL (ref 6.0–8.3)

## 2015-04-16 LAB — BASIC METABOLIC PANEL
BUN: 14 mg/dL (ref 6–23)
CO2: 27 mEq/L (ref 19–32)
Calcium: 9.1 mg/dL (ref 8.4–10.5)
Chloride: 105 mEq/L (ref 96–112)
Creatinine, Ser: 0.96 mg/dL (ref 0.40–1.50)
GFR: 89.95 mL/min (ref >60.00)
GLUCOSE: 187 mg/dL — AB (ref 70–99)
Potassium: 4.1 mEq/L (ref 3.5–5.1)
Sodium: 137 mEq/L (ref 135–145)

## 2015-04-16 LAB — HEMOGLOBIN A1C: HEMOGLOBIN A1C: 7.8 % — AB (ref 4.6–6.5)

## 2015-04-16 MED ORDER — LIRAGLUTIDE 18 MG/3ML ~~LOC~~ SOPN: 1.8000 mg | PEN_INJECTOR | Freq: Every day | SUBCUTANEOUS | Status: DC

## 2015-04-16 MED ORDER — LISINOPRIL 5 MG PO TABS: 5.0000 mg | ORAL_TABLET | Freq: Every day | ORAL | Status: DC

## 2015-04-16 NOTE — Patient Instructions (Signed)
Schedule your complete physical in 3-4 months We'll notify you of your lab results and make any changes if needed Keep up the good work!  You look great! Call with any questions or concerns Enjoy your time off!!!

## 2015-04-16 NOTE — Progress Notes (Signed)
Pre visit review using our clinic review tool, if applicable. No additional management support is needed unless otherwise documented below in the visit note. 

## 2015-04-16 NOTE — Progress Notes (Signed)
   Subjective:    Patient ID: Frederick Abbott, male    DOB: 01/25/1971, 44 y.o.   MRN: [id number]  HPI DM- chronic problem, was previously seeing Dr Sharl MaKerr but was released b/c of good control.  Currently on Victoza.  On ACE for renal protection.  UTD on eye exam.  UTD on foot exam.  Labile blood sugars.  Denies symptomatic lows.  No CP, SOB, HAs, visual changes, edema, abd pain, N/V.   Review of Systems For ROS see HPI     Objective:   Physical Exam  Constitutional: He is oriented to person, place, and time. He appears well-developed and well-nourished. No distress.  HENT:  Head: Normocephalic and atraumatic.  Eyes: Conjunctivae and EOM are normal. Pupils are equal, round, and reactive to light.  Neck: Normal range of motion. Neck supple. No thyromegaly present.  Cardiovascular: Normal rate, regular rhythm, normal heart sounds and intact distal pulses.   No murmur heard. Pulmonary/Chest: Effort normal and breath sounds normal. No respiratory distress.  Abdominal: Soft. Bowel sounds are normal. He exhibits no distension.  Musculoskeletal: He exhibits no edema.  Lymphadenopathy:    He has no cervical adenopathy.  Neurological: He is alert and oriented to person, place, and time. No cranial nerve deficit.  Skin: Skin is warm and dry.  Psychiatric: He has a normal mood and affect. His behavior is normal.  Vitals reviewed.         Assessment & Plan:

## 2015-04-16 NOTE — Assessment & Plan Note (Signed)
Ongoing issue for pt.  Repeat today to trend as pt has not had recent GI f/u.

## 2015-04-16 NOTE — Assessment & Plan Note (Signed)
Chronic problem.  Pt reports sugars have been labile and feels this is stress related.  Denies symptomatic lows.  UTD on eye exam, foot exam, and is on ACE for renal protection.  Check labs.  Adjust meds prn

## 2015-04-29 ENCOUNTER — Other Ambulatory Visit: Payer: Self-pay | Admitting: Family Medicine

## 2015-04-29 NOTE — Telephone Encounter (Signed)
Med filled.  

## 2015-06-23 ENCOUNTER — Ambulatory Visit (INDEPENDENT_AMBULATORY_CARE_PROVIDER_SITE_OTHER): Payer: BLUE CROSS/BLUE SHIELD | Admitting: Family Medicine

## 2015-06-23 ENCOUNTER — Encounter: Payer: Self-pay | Admitting: Family Medicine

## 2015-06-23 ENCOUNTER — Encounter: Payer: Self-pay | Admitting: Gastroenterology

## 2015-06-23 VITALS — BP 132/88 | HR 97 | Temp 98.1°F | Resp 16 | Wt 162.1 lb

## 2015-06-23 DIAGNOSIS — F411 Generalized anxiety disorder: Secondary | ICD-10-CM

## 2015-06-23 DIAGNOSIS — R112 Nausea with vomiting, unspecified: Secondary | ICD-10-CM | POA: Diagnosis not present

## 2015-06-23 DIAGNOSIS — K625 Hemorrhage of anus and rectum: Secondary | ICD-10-CM | POA: Diagnosis not present

## 2015-06-23 LAB — HEPATIC FUNCTION PANEL
ALBUMIN: 4.8 g/dL (ref 3.5–5.2)
ALT: 47 U/L (ref 0–53)
AST: 45 U/L — ABNORMAL HIGH (ref 0–37)
Alkaline Phosphatase: 70 U/L (ref 39–117)
Bilirubin, Direct: 0.1 mg/dL (ref 0.0–0.3)
TOTAL PROTEIN: 7.4 g/dL (ref 6.0–8.3)
Total Bilirubin: 0.5 mg/dL (ref 0.2–1.2)

## 2015-06-23 LAB — CBC WITH DIFFERENTIAL/PLATELET
Basophils Absolute: 0 10*3/uL (ref 0.0–0.1)
Basophils Relative: 0.3 % (ref 0.0–3.0)
EOS PCT: 1.1 % (ref 0.0–5.0)
Eosinophils Absolute: 0.1 10*3/uL (ref 0.0–0.7)
HEMATOCRIT: 50.6 % (ref 39.0–52.0)
Hemoglobin: 17.4 g/dL — ABNORMAL HIGH (ref 13.0–17.0)
LYMPHS PCT: 17.2 % (ref 12.0–46.0)
Lymphs Abs: 2.1 10*3/uL (ref 0.7–4.0)
MCHC: 34.5 g/dL (ref 30.0–36.0)
MCV: 90.5 fl (ref 78.0–100.0)
MONOS PCT: 5.5 % (ref 3.0–12.0)
Monocytes Absolute: 0.7 10*3/uL (ref 0.1–1.0)
NEUTROS ABS: 9.4 10*3/uL — AB (ref 1.4–7.7)
Neutrophils Relative %: 75.9 % (ref 43.0–77.0)
Platelets: 304 10*3/uL (ref 150.0–400.0)
RBC: 5.59 Mil/uL (ref 4.22–5.81)
RDW: 12.7 % (ref 11.5–15.5)
WBC: 12.3 10*3/uL — ABNORMAL HIGH (ref 4.0–10.5)

## 2015-06-23 LAB — BASIC METABOLIC PANEL
BUN: 17 mg/dL (ref 6–23)
CALCIUM: 9.8 mg/dL (ref 8.4–10.5)
CHLORIDE: 103 meq/L (ref 96–112)
CO2: 30 mEq/L (ref 19–32)
CREATININE: 1.07 mg/dL (ref 0.40–1.50)
GFR: 79.3 mL/min (ref >60.00)
Glucose, Bld: 183 mg/dL — ABNORMAL HIGH (ref 70–99)
Potassium: 4.3 mEq/L (ref 3.5–5.1)
Sodium: 140 mEq/L (ref 135–145)

## 2015-06-23 LAB — H. PYLORI ANTIBODY, IGG: H PYLORI IGG: NEGATIVE

## 2015-06-23 LAB — LIPASE: LIPASE: 55 U/L (ref 11.0–59.0)

## 2015-06-23 LAB — AMYLASE: AMYLASE: 41 U/L (ref 27–131)

## 2015-06-23 MED ORDER — BUPROPION HCL ER (XL) 150 MG PO TB24: 150.0000 mg | ORAL_TABLET | Freq: Every day | ORAL | Status: DC

## 2015-06-23 NOTE — Progress Notes (Signed)
Pre visit review using our clinic review tool, if applicable. No additional management support is needed unless otherwise documented below in the visit note. 

## 2015-06-23 NOTE — Patient Instructions (Signed)
Follow up in 1 month to recheck mood and reflux We'll notify you of your lab results and make any changes if needed I sent GI a notification to schedule an appt for you Increase the Protonix to twice daily Start the Wellbutrin once daily Call with any questions or concerns Hang in there!!!

## 2015-06-23 NOTE — Progress Notes (Signed)
   Subjective:    Patient ID: Frederick Abbott, male    DOB: Mar 11, 1971, 44 y.o.   MRN: [id number]  HPI Nausea/vomiting- 'this morning i threw up.  It's not the 1st time since I saw you last'.  Pt becomes tearful.  + fatigue.  Pt has had 3-4 episodes of vomiting at work.  Attributed this to stress.  Pt has hx of GERD.  Reports vomiting is 'not preceded by any type of nausea'.  Vomited at 7:30am and again at 9:15 today.  Pt had 'blood wrapped' stool the other day at work and 'you could see the blood swirling in the toilet'.  Pt saw Dr Arlyce Dice in Feb.  Anxiety/depression- ongoing problem for pt.  Not currently on meds.  Previously on Wellbutrin.   Review of Systems For ROS see HPI     Objective:   Physical Exam  Constitutional: He appears well-developed and well-nourished. No distress.  HENT:  Head: Normocephalic and atraumatic.  Neck: Neck supple.  Cardiovascular: Normal rate, regular rhythm, normal heart sounds and intact distal pulses.   Pulmonary/Chest: Effort normal and breath sounds normal. No respiratory distress. He has no wheezes. He has no rales.  Abdominal: Soft. Bowel sounds are normal. He exhibits no distension. There is no tenderness. There is no rebound and no guarding.  Lymphadenopathy:    He has no cervical adenopathy.  Skin: Skin is warm and dry.  Psychiatric:  Tearful, anxious  Vitals reviewed.         Assessment & Plan:

## 2015-06-24 NOTE — Assessment & Plan Note (Signed)
Recurrent problem for pt.  Will attempt to arrange f/u w/ GI.

## 2015-06-24 NOTE — Assessment & Plan Note (Signed)
New.  Based on pt's description, I suspect this is due to untreated GERD.  Start PPI.  Work on Optician, dispensing.  Check labs to r/o underlying causes although no red flags on hx or PE.  Reviewed supportive care and red flags that should prompt return.  Pt expressed understanding and is in agreement w/ plan.

## 2015-06-24 NOTE — Assessment & Plan Note (Signed)
Deteriorated.  Suspect this is worsening pt's abdominal issues.  Restart Wellbutrin as pt did well on this previously w/o side effects.  Will follow closely

## 2015-07-16 ENCOUNTER — Other Ambulatory Visit: Payer: Self-pay | Admitting: Family Medicine

## 2015-07-16 NOTE — Telephone Encounter (Signed)
Medication filled to pharmacy as requested.   

## 2015-07-20 ENCOUNTER — Encounter: Payer: Self-pay | Admitting: Family Medicine

## 2015-07-22 MED ORDER — BUPROPION HCL ER (XL) 150 MG PO TB24: 150.0000 mg | ORAL_TABLET | Freq: Every day | ORAL | Status: DC

## 2015-07-22 MED ORDER — PANTOPRAZOLE SODIUM 40 MG PO TBEC: 40.0000 mg | DELAYED_RELEASE_TABLET | Freq: Two times a day (BID) | ORAL | Status: DC

## 2015-07-22 NOTE — Telephone Encounter (Signed)
Medication filled to pharmacy as requested.   

## 2015-07-24 ENCOUNTER — Ambulatory Visit: Payer: BLUE CROSS/BLUE SHIELD | Admitting: Family Medicine

## 2015-08-12 ENCOUNTER — Encounter: Payer: Self-pay | Admitting: Family Medicine

## 2015-08-13 MED ORDER — INSULIN PEN NEEDLE 32G X 4 MM MISC: 24.0000 [IU] | Freq: Every day | Status: DC

## 2015-08-13 NOTE — Telephone Encounter (Signed)
Medication filled to pharmacy as requested.   

## 2015-08-18 ENCOUNTER — Encounter: Payer: Self-pay | Admitting: Physician Assistant

## 2015-08-18 ENCOUNTER — Ambulatory Visit (AMBULATORY_SURGERY_CENTER): Payer: Self-pay | Admitting: *Deleted

## 2015-08-18 ENCOUNTER — Ambulatory Visit (INDEPENDENT_AMBULATORY_CARE_PROVIDER_SITE_OTHER): Payer: BLUE CROSS/BLUE SHIELD | Admitting: Physician Assistant

## 2015-08-18 VITALS — Ht 70.0 in | Wt 161.8 lb

## 2015-08-18 VITALS — BP 102/64 | HR 88 | Ht 70.0 in | Wt 163.0 lb

## 2015-08-18 DIAGNOSIS — K219 Gastro-esophageal reflux disease without esophagitis: Secondary | ICD-10-CM

## 2015-08-18 DIAGNOSIS — K625 Hemorrhage of anus and rectum: Secondary | ICD-10-CM

## 2015-08-18 DIAGNOSIS — R1110 Vomiting, unspecified: Secondary | ICD-10-CM

## 2015-08-18 MED ORDER — NA SULFATE-K SULFATE-MG SULF 17.5-3.13-1.6 GM/177ML PO SOLN: 1.0000 | Freq: Once | ORAL | Status: AC

## 2015-08-18 NOTE — Progress Notes (Signed)
Patient ID: PERLEY ARTHURS, male   DOB: 1970/12/09, 44 y.o.   MRN: [id number]   Subjective:    Patient ID: OSSIEL MARCHIO, male    DOB: 1970/11/22, 43 y.o.   MRN: [id number]  HPI  Gerald Stabs is a pleasant 44 year old white male known previously to Dr. Deatra Ina who had been seen in February 2016. At that time colonoscopy was suggested, but not scheduled. Patient had complaints of intermittent rectal bleeding. He is now referred by Dr. Birdie Riddle  for colonoscopy as he had complained of some increase in rectal bleeding. He was scheduled for colonoscopy with Dr. Fuller Plan however at previsit discussed upper GI symptoms and office visit was arranged. Patient says that he has had some very low-grade intermittent rectal bleeding over the past year or so. A couple of months ago he had a period of time where he was noticing a small amount of bright red blood with every bowel movement and sometimes blood encased on the bowel movement. He had not seen any blood for couple of weeks until this morning noted a very small amount on the tissue. He says normally he has 3-5 bowel movements per day and that his his usual. He gets urgency after meals on occasion but not necessarily diarrhea. He has no current complaints of abdominal cramping. Appetite has been fine and weight has been stable. Patient also has had a long history over the past 7 years or so of intermittent episodes of vomiting. Says prior to having his gallbladder out in 2013 he would have episodes of belching and burping of a sulfur he tasting material sometimes with intractable vomiting. Currently having intermittent sporadic episodes of vomiting which are not necessarily associated with any nausea abdominal pain etc. Says yesterday he was at work and that he was a bit stressed felt like he had vomited the food was already up in the back of his throat. He vomited and then felt better. He is on protonix 40 mg by mouth twice a day over the past few months for reflux  symptoms. He says is not having any heartburn or indigestion and had been having frequent reflux. Patient does have HIV well controlled on Atripla, also with seizure disorder, adult-onset diabetes mellitus, and prior exposure to hepatitis B - hepatitis B surface antibody is positive  Review of Systems Pertinent positive and negative review of systems were noted in the above HPI section.  All other review of systems was otherwise negative.  Outpatient Encounter Prescriptions as of 08/18/2015  Medication Sig  . aspirin 81 MG tablet Take 81 mg by mouth daily.  Marland Kitchen buPROPion (WELLBUTRIN XL) 150 MG 24 hr tablet Take 1 tablet (150 mg total) by mouth daily.  Marland Kitchen efavirenz-emtricitabine-tenofovir (ATRIPLA) 600-200-300 MG per tablet Take 1 tablet by mouth at bedtime.  . fenofibrate 160 MG tablet TAKE 1 TABLET (160 MG TOTAL) BY MOUTH DAILY.  Marland Kitchen glucose blood test strip 1 each by Other route 3 (three) times daily as needed. Use as directed.  . Insulin Pen Needle (BD PEN NEEDLE NANO U/F) 32G X 4 MM MISC Inject 24 Units into the skin daily.  . Liraglutide (VICTOZA) 18 MG/3ML SOPN Inject 0.3 mLs (1.8 mg total) into the skin daily.  Marland Kitchen lisinopril (PRINIVIL,ZESTRIL) 5 MG tablet TAKE 1 TABLET (5 MG TOTAL) BY MOUTH DAILY.  Marland Kitchen loratadine (CLARITIN) 10 MG tablet Take 10 mg by mouth daily.  . magnesium oxide (MAG-OX) 400 MG tablet Take 400 mg by mouth daily.  Glory Rosebush DELICA LANCETS  33G MISC by Does not apply route.  . pantoprazole (PROTONIX) 40 MG tablet Take 1 tablet (40 mg total) by mouth 2 (two) times daily.  . Probiotic Product (PROBIOTIC DAILY PO) Take by mouth.  . triamcinolone ointment (KENALOG) 0.1 % Apply topically 2 (two) times daily.  . Na Sulfate-K Sulfate-Mg Sulf SOLN Take 1 kit by mouth once.   No facility-administered encounter medications on file as of 08/18/2015.   Allergies  Allergen Reactions  . Phenytoin Other (See Comments)    Childhood pt unsure  . Metformin And Related Nausea And Vomiting     Patient Active Problem List   Diagnosis Date Noted  . Rectal bleeding 12/27/2014  . Nausea with vomiting 12/16/2014  . Cough 12/16/2014  . Elevated LFTs 12/16/2014  . Left wrist pain 09/16/2014  . Wart 08/15/2014  . Eustachian tube dysfunction 08/09/2013  . UTI (urinary tract infection) 08/09/2013  . Routine general medical examination at a health care facility 02/07/2013  . Dyshidrotic eczema 02/07/2013  . Chronic cholecystitis with calculus 05/09/2012  . Heel pain 09/23/2011  . DM II (diabetes mellitus, type II), controlled ([place]) 12/11/2009  . DECREASED HEARING, BILATERAL 09/18/2009  . LOW BACK PAIN, CHRONIC 10/17/2008  . VENEREAL WART 08/20/2008  . Hyperlipidemia 08/20/2008  . GERD 06/05/2007  . Human immunodeficiency virus (HIV) disease ([place]) 06/01/2007  . Anxiety state 06/01/2007  . DEPRESSION 06/01/2007  . ALLERGIC RHINITIS 06/01/2007  . CARBUNCLE/FURUNCLE NOS 06/01/2007  . PSORIASIS NEC 06/01/2007  . NECROSIS, ASEPTIC, FEMUR HEAD/NECK 06/01/2007  . SEIZURE DISORDER 06/01/2007  . HEPATITIS B, HX OF 06/01/2007   Social History   Social History  . Marital Status: Single    Spouse Name: N/A  . Number of Children: N/A  . Years of Education: N/A   Occupational History  . Not on file.   Social History Main Topics  . Smoking status: Former Smoker -- 0.80 packs/day for 20 years    Types: Cigarettes    Quit date: 05/06/2006  . Smokeless tobacco: Never Used  . Alcohol Use: 0.5 oz/week    1 Standard drinks or equivalent per week     Comment: 1-2 drinks every week  . Drug Use: Yes    Special: Marijuana  . Sexual Activity:    Partners: Male     Comment: declined condoms   Other Topics Concern  . Not on file   Social History Narrative    Mr. Brindle family history includes Cancer in his maternal grandmother, mother, and paternal grandfather. There is no history of Colon cancer.      Objective:    Filed Vitals:   08/18/15 1439  BP: 102/64  Pulse: 88     Physical Exam  well-developed white male in no acute distress, pleasant blood pressure 102/64 pulse 88 height 5 foot 10 weight 163. HEENT; nontraumatic normocephalic EOMI PERRLA sclera anicteric, Cardiovascular; regular rate and rhythm with S1-S2 no murmur rub or gallop, Pulmonary ;clear bilaterally, Abdomen; soft basically nontender no palpable mass or hepatosplenomegaly bowel sounds are present, Rectal ;exam not done, Extremities; no clubbing cyanosis or edema skin warm and dry, Neuropsych; mood and affect appropriate       Assessment & Plan:   #1 44 yo male with intermittent rectal  Bleeding- r/o local anorectal  Irritation, r/o internal hemorrhoids,occult lesion #2 chronic Jerrye Bushy- requiring PPI long term #3 Intermittent Vomiting without pain or nausea- ? Anxiety related (wellbutrin just added), r/o Reflux with regurgitation #4 HIV- well controlled on Atripla #5 AODM #  6 seizure disorder  #7 s/p cholecystectomy #8 minimal elevation of AST  Plan; Continue BID Protonix for now  Schedule for EGD and Colonoscopy with Dr Milana Kidney prefers to stay with Dr Fuller Plan). Procedures discussed in detail with pt and he is agreeable to proceed.    Edman Lipsey S Cyrilla Durkin PA-C 08/18/2015   Cc: Midge Minium, MD

## 2015-08-18 NOTE — Progress Notes (Signed)
Patient not given instructions for colon. Office visit scheduled with A. Esterwood 08/18/15 at 1430 for evaluation of upper GI complaints. Patient aware that 08/25/15 colon was cancelled and will be rescheduled after evaluation of upper GI complaints and discussion of need for endoscopy as well.

## 2015-08-18 NOTE — Patient Instructions (Addendum)
You have been scheduled for an endoscopy and colonoscopy. Please follow the written instructions given to you at your visit today. Please pick up your prep supplies at the pharmacy within the next 1-3 days. If you use inhalers (even only as needed), please bring them with you on the day of your procedure.  Please continue taking Protonix twice a day

## 2015-08-18 NOTE — Progress Notes (Signed)
Reviewed and agree with management plan.  Carl Bleecker T. Paulmichael Schreck, MD FACG 

## 2015-08-25 ENCOUNTER — Encounter: Payer: BLUE CROSS/BLUE SHIELD | Admitting: Gastroenterology

## 2015-08-31 ENCOUNTER — Other Ambulatory Visit: Payer: Self-pay | Admitting: Family Medicine

## 2015-09-01 NOTE — Telephone Encounter (Signed)
Medication filled to pharmacy as requested.   

## 2015-09-02 ENCOUNTER — Encounter: Payer: BLUE CROSS/BLUE SHIELD | Admitting: Gastroenterology

## 2015-09-10 ENCOUNTER — Telehealth: Payer: Self-pay | Admitting: Behavioral Health

## 2015-09-10 ENCOUNTER — Encounter: Payer: Self-pay | Admitting: Behavioral Health

## 2015-09-10 NOTE — Telephone Encounter (Signed)
Pre-Visit Call completed with patient and chart updated.   Pre-Visit Info documented in Specialty Comments under [employer].    

## 2015-09-11 ENCOUNTER — Encounter: Payer: Self-pay | Admitting: Family Medicine

## 2015-09-11 ENCOUNTER — Ambulatory Visit (INDEPENDENT_AMBULATORY_CARE_PROVIDER_SITE_OTHER): Payer: BLUE CROSS/BLUE SHIELD | Admitting: Family Medicine

## 2015-09-11 VITALS — BP 112/82 | HR 93 | Temp 98.0°F | Resp 16 | Ht 70.0 in | Wt 161.5 lb

## 2015-09-11 DIAGNOSIS — Z0000 Encounter for general adult medical examination without abnormal findings: Secondary | ICD-10-CM

## 2015-09-11 DIAGNOSIS — E119 Type 2 diabetes mellitus without complications: Secondary | ICD-10-CM | POA: Diagnosis not present

## 2015-09-11 DIAGNOSIS — Z23 Encounter for immunization: Secondary | ICD-10-CM

## 2015-09-11 DIAGNOSIS — B20 Human immunodeficiency virus [HIV] disease: Secondary | ICD-10-CM

## 2015-09-11 LAB — BASIC METABOLIC PANEL
BUN: 15 mg/dL (ref 6–23)
CHLORIDE: 106 meq/L (ref 96–112)
CO2: 26 meq/L (ref 19–32)
Calcium: 9.3 mg/dL (ref 8.4–10.5)
Creatinine, Ser: 1.01 mg/dL (ref 0.40–1.50)
GFR: 84.68 mL/min (ref >60.00)
Glucose, Bld: 172 mg/dL — ABNORMAL HIGH (ref 70–99)
POTASSIUM: 4.2 meq/L (ref 3.5–5.1)
Sodium: 141 mEq/L (ref 135–145)

## 2015-09-11 LAB — LIPID PANEL
CHOL/HDL RATIO: 4
Cholesterol: 207 mg/dL — ABNORMAL HIGH (ref 0–200)
HDL: 52.5 mg/dL (ref >39.00)
LDL Cholesterol: 117 mg/dL — ABNORMAL HIGH (ref 0–99)
NONHDL: 154.45
Triglycerides: 186 mg/dL — ABNORMAL HIGH (ref 0.0–149.0)
VLDL: 37.2 mg/dL (ref 0.0–40.0)

## 2015-09-11 LAB — CBC WITH DIFFERENTIAL/PLATELET
Basophils Absolute: 0 10*3/uL (ref 0.0–0.1)
Basophils Relative: 0.5 % (ref 0.0–3.0)
EOS PCT: 4.1 % (ref 0.0–5.0)
Eosinophils Absolute: 0.3 10*3/uL (ref 0.0–0.7)
HEMATOCRIT: 49.9 % (ref 39.0–52.0)
HEMOGLOBIN: 16.6 g/dL (ref 13.0–17.0)
LYMPHS PCT: 32.7 % (ref 12.0–46.0)
Lymphs Abs: 2.7 10*3/uL (ref 0.7–4.0)
MCHC: 33.2 g/dL (ref 30.0–36.0)
MCV: 91.9 fl (ref 78.0–100.0)
MONO ABS: 0.8 10*3/uL (ref 0.1–1.0)
Monocytes Relative: 9.6 % (ref 3.0–12.0)
Neutro Abs: 4.3 10*3/uL (ref 1.4–7.7)
Neutrophils Relative %: 53.1 % (ref 43.0–77.0)
Platelets: 308 10*3/uL (ref 150.0–400.0)
RBC: 5.43 Mil/uL (ref 4.22–5.81)
RDW: 12.8 % (ref 11.5–15.5)
WBC: 8.2 10*3/uL (ref 4.0–10.5)

## 2015-09-11 LAB — HEPATIC FUNCTION PANEL
ALK PHOS: 93 U/L (ref 39–117)
ALT: 56 U/L — ABNORMAL HIGH (ref 0–53)
AST: 42 U/L — AB (ref 0–37)
Albumin: 4.4 g/dL (ref 3.5–5.2)
BILIRUBIN DIRECT: 0.1 mg/dL (ref 0.0–0.3)
BILIRUBIN TOTAL: 0.4 mg/dL (ref 0.2–1.2)
Total Protein: 6.8 g/dL (ref 6.0–8.3)

## 2015-09-11 LAB — PSA: PSA: 1.08 ng/mL (ref 0.10–4.00)

## 2015-09-11 LAB — HEMOGLOBIN A1C: HEMOGLOBIN A1C: 7.6 % — AB (ref 4.6–6.5)

## 2015-09-11 LAB — TSH: TSH: 1.87 u[IU]/mL (ref 0.35–4.50)

## 2015-09-11 NOTE — Progress Notes (Signed)
   Subjective:    Patient ID: Frederick MillinChristopher R Stanislawski, male    DOB: 01/28/1971, 44 y.o.   MRN: [id number]  HPI CPE- UTD on foot exam, eye exam, has upcoming colonoscopy/endoscopy.   Review of Systems Patient reports no vision/hearing changes, anorexia, fever ,adenopathy, persistant/recurrent hoarseness, swallowing issues, chest pain, palpitations, edema, persistant/recurrent cough, hemoptysis, dyspnea (rest,exertional, paroxysmal nocturnal), gastrointestinal  bleeding (melena, rectal bleeding), excessive heart burn, GU symptoms (dysuria, hematuria, voiding/incontinence issues) syncope, focal weakness, memory loss, numbness & tingling, skin/hair/nail changes, depression, anxiety, abnormal bruising/bleeding, musculoskeletal symptoms/signs.   + abd pain- following w/ GI    Objective:   Physical Exam General Appearance:    Alert, cooperative, no distress, appears stated age  Head:    Normocephalic, without obvious abnormality, atraumatic  Eyes:    PERRL, conjunctiva/corneas clear, EOM's intact, fundi    benign, both eyes       Ears:    Normal TM's and external ear canals, both ears  Nose:   Nares normal, septum midline, mucosa normal, no drainage   or sinus tenderness  Throat:   Lips, mucosa, and tongue normal; teeth and gums normal  Neck:   Supple, symmetrical, trachea midline, no adenopathy;       thyroid:  No enlargement/tenderness/nodules  Back:     Symmetric, no curvature, ROM normal, no CVA tenderness  Lungs:     Clear to auscultation bilaterally, respirations unlabored  Chest wall:    No tenderness or deformity  Heart:    Regular rate and rhythm, S1 and S2 normal, no murmur, rub   or gallop  Abdomen:     Soft, non-tender, bowel sounds active all four quadrants,    no masses, no organomegaly  Genitalia:    Normal male without lesion, masses,discharge or tenderness  Rectal:    Deferred due to young age  Extremities:   Extremities normal, atraumatic, no cyanosis or edema  Pulses:   2+  and symmetric all extremities  Skin:   Skin color, texture, turgor normal, no rashes or lesions  Lymph nodes:   Cervical, supraclavicular, and axillary nodes normal  Neurologic:   CNII-XII intact. Normal strength, sensation and reflexes      throughout          Assessment & Plan:

## 2015-09-11 NOTE — Assessment & Plan Note (Signed)
Chronic problem.  Pt reports sugars have been well controlled.  UTD on eye exam, foot exam, on ACE for renal protection.  Check labs.  Adjust meds prn

## 2015-09-11 NOTE — Assessment & Plan Note (Signed)
Pt's PE WNL.  Check labs.  Flu shot given.  Anticipatory guidance provided.

## 2015-09-11 NOTE — Patient Instructions (Signed)
Follow up in 3-4 months to recheck diabetes We'll notify you of your lab results and make any changes if needed Call and schedule an appt w/ Dr Orvan Falconerampbell Keep up the good work on healthy diet and regular exercise- you look great! Call with any questions or concerns If you want to join us at the new [place] office, any scheduled appointments will automatically transfer and we will see you at [address], [place], [place] [id number]  Happy Thanksgiving!!!

## 2015-09-11 NOTE — Progress Notes (Signed)
Pre visit review using our clinic review tool, if applicable. No additional management support is needed unless otherwise documented below in the visit note. 

## 2015-09-11 NOTE — Assessment & Plan Note (Signed)
Chronic problem.  Following w/ Dr Orvan Falconerampbell.  Overdue for an appt.  Pt encouraged to schedule.

## 2015-09-16 ENCOUNTER — Other Ambulatory Visit: Payer: Self-pay | Admitting: Internal Medicine

## 2015-10-09 ENCOUNTER — Other Ambulatory Visit: Payer: BLUE CROSS/BLUE SHIELD

## 2015-10-09 DIAGNOSIS — B20 Human immunodeficiency virus [HIV] disease: Secondary | ICD-10-CM

## 2015-10-09 LAB — CBC
HCT: 46.6 % (ref 39.0–52.0)
Hemoglobin: 16.9 g/dL (ref 13.0–17.0)
MCH: 31.7 pg (ref 26.0–34.0)
MCHC: 36.3 g/dL — AB (ref 30.0–36.0)
MCV: 87.4 fL (ref 78.0–100.0)
MPV: 9.2 fL (ref 8.6–12.4)
PLATELETS: 281 10*3/uL (ref 150–400)
RBC: 5.33 MIL/uL (ref 4.22–5.81)
RDW: 12.9 % (ref 11.5–15.5)
WBC: 10.7 10*3/uL — ABNORMAL HIGH (ref 4.0–10.5)

## 2015-10-09 LAB — COMPREHENSIVE METABOLIC PANEL
ALK PHOS: 66 U/L (ref 40–115)
ALT: 54 U/L — ABNORMAL HIGH (ref 9–46)
AST: 47 U/L — ABNORMAL HIGH (ref 10–40)
Albumin: 4.1 g/dL (ref 3.6–5.1)
BILIRUBIN TOTAL: 0.6 mg/dL (ref 0.2–1.2)
BUN: 19 mg/dL (ref 7–25)
CALCIUM: 8.9 mg/dL (ref 8.6–10.3)
CO2: 21 mmol/L (ref 20–31)
CREATININE: 1.05 mg/dL (ref 0.60–1.35)
Chloride: 103 mmol/L (ref 98–110)
GLUCOSE: 166 mg/dL — AB (ref 65–99)
POTASSIUM: 4.2 mmol/L (ref 3.5–5.3)
Sodium: 136 mmol/L (ref 135–146)
TOTAL PROTEIN: 6.5 g/dL (ref 6.1–8.1)

## 2015-10-09 LAB — LIPID PANEL
Cholesterol: 212 mg/dL — ABNORMAL HIGH (ref 125–200)
HDL: 51 mg/dL (ref >=40)
LDL CALC: 136 mg/dL — AB (ref <130)
TRIGLYCERIDES: 125 mg/dL (ref <150)
Total CHOL/HDL Ratio: 4.2 Ratio (ref <=5.0)
VLDL: 25 mg/dL (ref <30)

## 2015-10-09 LAB — RPR

## 2015-10-10 LAB — T-HELPER CELL (CD4) - (RCID CLINIC ONLY)
CD4 T CELL HELPER: 37 % (ref 33–55)
CD4 T Cell Abs: 1290 /uL (ref 400–2700)

## 2015-10-12 LAB — HIV-1 RNA QUANT-NO REFLEX-BLD

## 2015-10-14 ENCOUNTER — Other Ambulatory Visit: Payer: Self-pay | Admitting: Internal Medicine

## 2015-10-15 ENCOUNTER — Encounter: Payer: Self-pay | Admitting: Gastroenterology

## 2015-10-23 ENCOUNTER — Ambulatory Visit (INDEPENDENT_AMBULATORY_CARE_PROVIDER_SITE_OTHER): Payer: BLUE CROSS/BLUE SHIELD | Admitting: Internal Medicine

## 2015-10-23 ENCOUNTER — Encounter: Payer: Self-pay | Admitting: Internal Medicine

## 2015-10-23 DIAGNOSIS — B20 Human immunodeficiency virus [HIV] disease: Secondary | ICD-10-CM

## 2015-10-23 MED ORDER — EFAVIRENZ-EMTRICITAB-TENOFOVIR 600-200-300 MG PO TABS: 1.0000 | ORAL_TABLET | Freq: Every day | ORAL | Status: DC

## 2015-10-23 NOTE — Progress Notes (Signed)
Patient Active Problem List   Diagnosis Date Noted  . DM II (diabetes mellitus, type II), controlled ([place]) 12/11/2009    Priority: High  . Hyperlipidemia 08/20/2008    Priority: High  . Human immunodeficiency virus (HIV) disease ([place]) 06/01/2007    Priority: High  . Rectal bleeding 12/27/2014  . Nausea with vomiting 12/16/2014  . Cough 12/16/2014  . Elevated LFTs 12/16/2014  . Left wrist pain 09/16/2014  . Wart 08/15/2014  . Eustachian tube dysfunction 08/09/2013  . UTI (urinary tract infection) 08/09/2013  . Routine general medical examination at a health care facility 02/07/2013  . Dyshidrotic eczema 02/07/2013  . Chronic cholecystitis with calculus 05/09/2012  . Heel pain 09/23/2011  . DECREASED HEARING, BILATERAL 09/18/2009  . LOW BACK PAIN, CHRONIC 10/17/2008  . VENEREAL WART 08/20/2008  . GERD 06/05/2007  . Anxiety state 06/01/2007  . DEPRESSION 06/01/2007  . ALLERGIC RHINITIS 06/01/2007  . CARBUNCLE/FURUNCLE NOS 06/01/2007  . PSORIASIS NEC 06/01/2007  . NECROSIS, ASEPTIC, FEMUR HEAD/NECK 06/01/2007  . SEIZURE DISORDER 06/01/2007  . HEPATITIS B, HX OF 06/01/2007    Patient's Medications  New Prescriptions   No medications on file  Previous Medications   ASPIRIN 81 MG TABLET    Take 81 mg by mouth daily.   BUPROPION (WELLBUTRIN XL) 150 MG 24 HR TABLET    Take 1 tablet (150 mg total) by mouth daily.   FENOFIBRATE 160 MG TABLET    TAKE 1 TABLET (160 MG TOTAL) BY MOUTH DAILY.   GLUCOSE BLOOD TEST STRIP    1 each by Other route 3 (three) times daily as needed. Use as directed.   LIRAGLUTIDE (VICTOZA) 18 MG/3ML SOPN    Inject 0.3 mLs (1.8 mg total) into the skin daily.   LISINOPRIL (PRINIVIL,ZESTRIL) 5 MG TABLET    TAKE 1 TABLET (5 MG TOTAL) BY MOUTH DAILY.   LORATADINE (CLARITIN) 10 MG TABLET    Take 10 mg by mouth daily.   MAGNESIUM OXIDE (MAG-OX) 400 MG TABLET    Take 400 mg by mouth daily.   ONETOUCH DELICA LANCETS 62M MISC    by Does not apply route.    PANTOPRAZOLE (PROTONIX) 40 MG TABLET    TAKE 1 TABLET (40 MG TOTAL) BY MOUTH DAILY.   PROBIOTIC PRODUCT (PROBIOTIC DAILY PO)    Take by mouth.   TRIAMCINOLONE OINTMENT (KENALOG) 0.1 %    Apply topically 2 (two) times daily.  Modified Medications   Modified Medication Previous Medication   EFAVIRENZ-EMTRICITABINE-TENOFOVIR (ATRIPLA) 600-200-300 MG TABLET ATRIPLA 600-200-300 MG tablet      Take 1 tablet by mouth at bedtime.    TAKE 1 TABLET BY MOUTH AT BEDTIME (NEEDS TO MAKE APPOINTMENT)  Discontinued Medications   INSULIN PEN NEEDLE (BD PEN NEEDLE NANO U/F) 32G X 4 MM MISC    Inject 24 Units into the skin daily.    Subjective:  Gerald Stabs is in for his routine HIV follow-up visit. He recalls having been late taking his Atripla on one or 2 occasions in the past 6 months but does not recall missing any doses. He states that he always has vivid dreams but these are not bothersome. He does have some daytime fatigue but he attributes that to his work and sleep schedule. He has had some problems with anxiety related to stress at work but no real depression recently. He is happy that he will be changing to a new job in January. He knows the  owner of the company and thinks that it'll be less stressful. He will not have to work weekends.   He recently had some blood in his stool. He is scheduled for upper endoscopy and colonoscopy next week.  Review of Systems: Review of Systems  Constitutional: Positive for malaise/fatigue. Negative for fever, chills, weight loss and diaphoresis.  HENT: Negative for sore throat.   Respiratory: Negative for cough, sputum production and shortness of breath.   Cardiovascular: Negative for chest pain.  Gastrointestinal: Positive for blood in stool. Negative for nausea, vomiting and diarrhea.  Musculoskeletal: Negative for joint pain.  Skin: Negative for rash.  Psychiatric/Behavioral: Negative for depression and substance abuse. The patient is nervous/anxious.     Past  Medical History  Diagnosis Date  . Diabetes mellitus   . HIV (human immunodeficiency virus infection) ([place])   . Hypercholesterolemia   . Neuropathy ([place])   . GERD (gastroesophageal reflux disease)   . Hepatitis     hepatitis B  . Cholecystitis chronic   . Depression   . Seizures ([place])     as a child, none since age 74  . Belching   . Diarrhea   . Cancer [facility])     pt denies any dx of cancer  . Pneumonia     childhood  . Anxiety     Social History  Substance Use Topics  . Smoking status: Former Smoker -- 0.80 packs/day for 20 years    Types: Cigarettes    Quit date: 05/06/2006  . Smokeless tobacco: Never Used  . Alcohol Use: 0.5 oz/week    1 Standard drinks or equivalent per week     Comment: 1-2 drinks every week    Family History  Problem Relation Age of Onset  . Cancer Mother     breast  . Cancer Maternal Grandmother     melanoma-nose  . Cancer Paternal Grandfather     melanoma-ear  . Colon cancer Neg Hx     Allergies  Allergen Reactions  . Phenytoin Other (See Comments)    Childhood pt unsure  . Metformin And Related Nausea And Vomiting    Objective:  Filed Vitals:   10/23/15 1536  BP: 127/75  Pulse: 85  Temp: 97.6 F (36.4 C)  TempSrc: Oral  Weight: 158 lb (71.668 kg)   Body mass index is 22.67 kg/(m^2).  Physical Exam  Constitutional: He is oriented to person, place, and time.  He is smiling and in good spirits  HENT:  Mouth/Throat: No oropharyngeal exudate.  Eyes: Conjunctivae are normal.  Cardiovascular: Normal rate and regular rhythm.   No murmur heard. Pulmonary/Chest: Breath sounds normal.  Abdominal: Soft. He exhibits no mass. There is no tenderness.  Neurological: He is alert and oriented to person, place, and time.  Skin: No rash noted.  Psychiatric: Mood and affect normal.    Lab Results Lab Results  Component Value Date   WBC 10.7* 10/09/2015   HGB 16.9 10/09/2015   HCT 46.6 10/09/2015   MCV 87.4 10/09/2015   PLT 281  10/09/2015    Lab Results  Component Value Date   CREATININE 1.05 10/09/2015   BUN 19 10/09/2015   NA 136 10/09/2015   K 4.2 10/09/2015   CL 103 10/09/2015   CO2 21 10/09/2015    Lab Results  Component Value Date   ALT 54* 10/09/2015   AST 47* 10/09/2015   ALKPHOS 66 10/09/2015   BILITOT 0.6 10/09/2015    Lab Results  Component Value  Date   CHOL 212* 10/09/2015   HDL 51 10/09/2015   LDLCALC 136* 10/09/2015   LDLDIRECT 129.8 08/15/2014   TRIG 125 10/09/2015   CHOLHDL 4.2 10/09/2015    Lab Results HIV 1 RNA QUANT (copies/mL)  Date Value  10/09/2015 <20  09/19/2014 <20  02/25/2014 22*   CD4 T CELL ABS (/uL)  Date Value  10/09/2015 1290  09/19/2014 1210  02/25/2014 1180      Problem List Items Addressed This Visit      High   Human immunodeficiency virus (HIV) disease ([place])    His HIV infection remains under excellent control. I talked to him about newer, safer antiretroviral options. He wants to consider switching to [facility] but would prefer to wait and see what his work and meal schedule is once he starts his new job. He will continue Atripla for now but will contact me in January to let me know if he wants to make a switch. I will check an HLA [id number] before his next visit in 6 months in case we need to consider Triumeq.      Relevant Medications   efavirenz-emtricitabine-tenofovir (ATRIPLA) 600-200-300 MG tablet   Other Relevant Orders   T-helper cell (CD4)- ([facility] clinic only)   HIV 1 RNA quant-no reflex-bld   CBC   Comprehensive metabolic panel   Lipid panel   RPR   HLA [id number]        Michel Bickers, MD [facility] [phone] pager   [phone] cell 10/23/2015, 4:02 PM

## 2015-10-23 NOTE — Assessment & Plan Note (Signed)
His HIV infection remains under excellent control. I talked to him about newer, safer antiretroviral options. He wants to consider switching to [facility] but would prefer to wait and see what his work and meal schedule is once he starts his new job. He will continue Atripla for now but will contact me in January to let me know if he wants to make a switch. I will check an HLA [id number] before his next visit in 6 months in case we need to consider Triumeq.

## 2015-10-24 ENCOUNTER — Encounter: Payer: Self-pay | Admitting: Internal Medicine

## 2015-10-24 ENCOUNTER — Encounter: Payer: Self-pay | Admitting: Family Medicine

## 2015-10-27 ENCOUNTER — Ambulatory Visit (AMBULATORY_SURGERY_CENTER): Payer: BLUE CROSS/BLUE SHIELD | Admitting: Gastroenterology

## 2015-10-27 ENCOUNTER — Encounter: Payer: Self-pay | Admitting: Gastroenterology

## 2015-10-27 VITALS — BP 88/68 | HR 77 | Temp 96.8°F | Resp 25 | Ht 70.0 in | Wt 163.0 lb

## 2015-10-27 DIAGNOSIS — K2100 Gastro-esophageal reflux disease with esophagitis, without bleeding: Secondary | ICD-10-CM

## 2015-10-27 DIAGNOSIS — K921 Melena: Secondary | ICD-10-CM

## 2015-10-27 DIAGNOSIS — K219 Gastro-esophageal reflux disease without esophagitis: Secondary | ICD-10-CM

## 2015-10-27 LAB — GLUCOSE, CAPILLARY
GLUCOSE-CAPILLARY: 127 mg/dL — AB (ref 65–99)
Glucose-Capillary: 138 mg/dL — ABNORMAL HIGH (ref 65–99)

## 2015-10-27 MED ORDER — SODIUM CHLORIDE 0.9 % IV SOLN: 500.0000 mL | INTRAVENOUS | Status: DC

## 2015-10-27 NOTE — Progress Notes (Signed)
Report to PACU, RN, vss, BBS= Clear.  

## 2015-10-27 NOTE — Op Note (Signed)
[facility] [address] [place], [id number]   COLONOSCOPY PROCEDURE REPORT  PATIENT: Frederick Abbott, Frederick R  MR#: [id number] BIRTHDATE: 1970-12-06 , 44  yrs. old GENDER: male ENDOSCOPIST: Meryl DareMalcolm T Natalia Wittmeyer, MD, Clementeen GrahamFACG REFERRED BY:  Sheliah HatchKatherine E Tabori, M.D. PROCEDURE DATE:  10/27/2015 PROCEDURE:   Colonoscopy, diagnostic First Screening Colonoscopy - Avg.  risk and is 50 yrs.  old or older - No.  Prior Negative Screening - Now for repeat screening. N/A  History of Adenoma - Now for follow-up colonoscopy & has been > or = to 3 yrs.  N/A  Polyps removed today? No Recommend repeat exam, <10 yrs? No ASA CLASS:   Class II INDICATIONS:Evaluation of unexplained GI bleeding and hematochezia.  MEDICATIONS: Monitored anesthesia care and Propofol 250 mg IV DESCRIPTION OF PROCEDURE:   After the risks benefits and alternatives of the procedure were thoroughly explained, informed consent was obtained.  The digital rectal exam revealed no abnormalities of the rectum.   The LB [id number]  endoscope was introduced through the anus and advanced to the cecum, which was identified by both the appendix and ileocecal valve. No adverse events experienced.   The quality of the prep was good.  (Suprep was used)  The instrument was then slowly withdrawn as the colon was fully examined. Estimated blood loss is zero unless otherwise noted in this procedure report.    COLON FINDINGS: A normal appearing cecum, ileocecal valve, and appendiceal orifice were identified.  The ascending, transverse, descending, sigmoid colon, and rectum appeared unremarkable. Retroflexed views revealed internal Grade I hemorrhoids. The time to cecum = 2.0 Withdrawal time = 12.0   The scope was withdrawn and the procedure completed. COMPLICATIONS: There were no immediate complications.  ENDOSCOPIC IMPRESSION: 1.  Normal colonoscopy 2.  Grade I internal hemorrhoids  RECOMMENDATIONS: 1.  Continue current  colorectal screening recommendations for "routine risk" patients with a repeat colonoscopy in 10 years. 2.  OTC Prep H supp daily as needed  eSigned:  Meryl DareMalcolm T Payden Bonus, MD, [facility] 10/27/2015 8:31 AM

## 2015-10-27 NOTE — Op Note (Signed)
[facility] [address] [place], [id number]   ENDOSCOPY PROCEDURE REPORT  PATIENT: Frederick Abbott, Frederick Abbott  MR#: [id number] BIRTHDATE: 1971-09-17 , 44  yrs. old GENDER: male ENDOSCOPIST: Meryl DareMalcolm T Buckley Bradly, MD, Clementeen GrahamFACG REFERRED BY:  Sheliah HatchKatherine E Tabori, M.D. PROCEDURE DATE:  10/27/2015 PROCEDURE:  EGD, diagnostic ASA CLASS:     Class II INDICATIONS:  history of esophageal reflux. MEDICATIONS: Monitored anesthesia care, Residual sedation present, and Propofol 150 mg IV TOPICAL ANESTHETIC: none DESCRIPTION OF PROCEDURE: After the risks benefits and alternatives of the procedure were thoroughly explained, informed consent was obtained.  The LB [id number] endoscope was introduced through the mouth and advanced to the second portion of the duodenum , Without limitations.  The instrument was slowly withdrawn as the mucosa was fully examined.    ESOPHAGUS: There was LA Class A esophagitis (One or more mucosal breaks < 5 mm in maximal length) noted.   The esophagus otherwise appeared normal. STOMACH: The mucosa of the stomach appeared normal. DUODENUM: The duodenal mucosa showed no abnormalities in the bulb and 2nd part of the duodenum.  Retroflexed views revealed no abnormalities.   The scope was then withdrawn from the patient and the procedure completed.  COMPLICATIONS: There were no immediate complications.  ENDOSCOPIC IMPRESSION: 1.   LA Class A esophagitis 2.   The EGD otherwise appeared normal  RECOMMENDATIONS: 1.  Anti-reflux regimen 2.  Continue PPI qam  eSigned:  Meryl DareMalcolm T Tyra Gural, MD, [facility] 10/27/2015 8:40 AM

## 2015-10-27 NOTE — Patient Instructions (Signed)
YOU HAD AN ENDOSCOPIC PROCEDURE TODAY AT THE [facility]:   Refer to the procedure report that was given to you for any specific questions about what was found during the examination.  If the procedure report does not answer your questions, please call your gastroenterologist to clarify.  If you requested that your care partner not be given the details of your procedure findings, then the procedure report has been included in a sealed envelope for you to review at your convenience later.  YOU SHOULD EXPECT: Some feelings of bloating in the abdomen. Passage of more gas than usual.  Walking can help get rid of the air that was put into your GI tract during the procedure and reduce the bloating. If you had a lower endoscopy (such as a colonoscopy or flexible sigmoidoscopy) you may notice spotting of blood in your stool or on the toilet paper. If you underwent a bowel prep for your procedure, you may not have a normal bowel movement for a few days.  Please Note:  You might notice some irritation and congestion in your nose or some drainage.  This is from the oxygen used during your procedure.  There is no need for concern and it should clear up in a day or so.  SYMPTOMS TO REPORT IMMEDIATELY:   Following lower endoscopy (colonoscopy or flexible sigmoidoscopy):  Excessive amounts of blood in the stool  Significant tenderness or worsening of abdominal pains  Swelling of the abdomen that is new, acute  Fever of 100F or higher   Following upper endoscopy (EGD)  Vomiting of blood or coffee ground material  New chest pain or pain under the shoulder blades  Painful or persistently difficult swallowing  New shortness of breath  Fever of 100F or higher  Black, tarry-looking stools  For urgent or emergent issues, a gastroenterologist can be reached at any hour by calling [phone].   DIET: Your first meal following the procedure should be a small meal and then it is ok to progress to  your normal diet. Heavy or fried foods are harder to digest and may make you feel nauseous or bloated.  Likewise, meals heavy in dairy and vegetables can increase bloating.  Drink plenty of fluids but you should avoid alcoholic beverages for 24 hours.  Try to go by the GERD diet.   ACTIVITY:  You should plan to take it easy for the rest of today and you should NOT DRIVE or use heavy machinery until tomorrow (because of the sedation medicines used during the test).    FOLLOW UP: Our staff will call the number listed on your records the next business day following your procedure to check on you and address any questions or concerns that you may have regarding the information given to you following your procedure. If we do not reach you, we will leave a message.  However, if you are feeling well and you are not experiencing any problems, there is no need to return our call.  We will assume that you have returned to your regular daily activities without incident.  If any biopsies were taken you will be contacted by phone or by letter within the next 1-3 weeks.  Please call us at [phone] if you have not heard about the biopsies in 3 weeks.    SIGNATURES/CONFIDENTIALITY: You and/or your care partner have signed paperwork which will be entered into your electronic medical record.  These signatures attest to the fact that that the information  above on your After Visit Summary has been reviewed and is understood.  Full responsibility of the confidentiality of this discharge information lies with you and/or your care-partner.  Take your PPI medicine for your reflux.  Thanks for choosing [place] for your healthcare needs.

## 2015-10-28 ENCOUNTER — Telehealth: Payer: Self-pay | Admitting: *Deleted

## 2015-10-28 NOTE — Telephone Encounter (Signed)
  Follow up Call-  Call back number 10/27/2015  Post procedure Call Back phone  # [phone] cell  Permission to leave phone message Yes     Patient questions:  Do you have a fever, pain , or abdominal swelling? No. Pain Score  0 *  Have you tolerated food without any problems? Yes.    Have you been able to return to your normal activities? Yes.    Do you have any questions about your discharge instructions: Diet   No. Medications  No. Follow up visit  No.  Do you have questions or concerns about your Care? No.  Actions: * If pain score is 4 or above: No action needed, pain <4.

## 2015-11-07 ENCOUNTER — Encounter: Payer: Self-pay | Admitting: Internal Medicine

## 2015-11-11 ENCOUNTER — Encounter: Payer: Self-pay | Admitting: Family Medicine

## 2015-11-11 ENCOUNTER — Other Ambulatory Visit: Payer: Self-pay | Admitting: *Deleted

## 2015-11-11 MED ORDER — EFAVIRENZ-EMTRICITAB-TENOFOVIR 600-200-300 MG PO TABS: 1.0000 | ORAL_TABLET | Freq: Every day | ORAL | Status: DC

## 2015-11-13 ENCOUNTER — Encounter: Payer: Self-pay | Admitting: Family Medicine

## 2015-11-13 ENCOUNTER — Encounter: Payer: Self-pay | Admitting: Internal Medicine

## 2015-11-13 NOTE — Telephone Encounter (Signed)
Letter faxed to Volney Presseranielle Hanson and paper copy mailed to pt. Pt informed via MyChart.

## 2015-11-20 ENCOUNTER — Encounter: Payer: Self-pay | Admitting: Family Medicine

## 2015-11-20 ENCOUNTER — Encounter: Payer: Self-pay | Admitting: Internal Medicine

## 2015-12-23 ENCOUNTER — Ambulatory Visit (INDEPENDENT_AMBULATORY_CARE_PROVIDER_SITE_OTHER): Payer: BLUE CROSS/BLUE SHIELD | Admitting: Medical

## 2015-12-23 ENCOUNTER — Encounter: Payer: Self-pay | Admitting: Medical

## 2015-12-23 VITALS — BP 108/74 | HR 78 | Temp 98.1°F | Ht 70.0 in | Wt 162.0 lb

## 2015-12-23 DIAGNOSIS — R0981 Nasal congestion: Secondary | ICD-10-CM | POA: Diagnosis not present

## 2015-12-23 DIAGNOSIS — J40 Bronchitis, not specified as acute or chronic: Secondary | ICD-10-CM

## 2015-12-23 DIAGNOSIS — M791 Myalgia: Secondary | ICD-10-CM

## 2015-12-23 DIAGNOSIS — J029 Acute pharyngitis, unspecified: Secondary | ICD-10-CM | POA: Diagnosis not present

## 2015-12-23 DIAGNOSIS — M609 Myositis, unspecified: Secondary | ICD-10-CM

## 2015-12-23 DIAGNOSIS — IMO0001 Reserved for inherently not codable concepts without codable children: Secondary | ICD-10-CM

## 2015-12-23 LAB — POCT INFLUENZA A/B
INFLUENZA A, POC: NEGATIVE
INFLUENZA B, POC: NEGATIVE

## 2015-12-23 LAB — POCT RAPID STREP A (OFFICE): Rapid Strep A Screen: NEGATIVE

## 2015-12-23 MED ORDER — BENZONATATE 100 MG PO CAPS: 100.0000 mg | ORAL_CAPSULE | Freq: Three times a day (TID) | ORAL | Status: DC | PRN

## 2015-12-23 MED ORDER — AZITHROMYCIN 250 MG PO TABS: ORAL_TABLET | ORAL | Status: DC

## 2015-12-23 MED ORDER — FLUTICASONE PROPIONATE 50 MCG/ACT NA SUSP: 2.0000 | Freq: Every day | NASAL | Status: DC

## 2015-12-23 NOTE — Addendum Note (Signed)
Addended by: Neldon Labella on: 12/23/2015 11:12 AM   Modules accepted: Orders

## 2015-12-23 NOTE — Progress Notes (Signed)
Subjective:    Patient ID: Frederick Abbott, male    DOB: 04/12/1971, 45 y.o.   MRN: [id number]  HPI   Pt in with st on Saturday. Pt states fever yesterday in am 99.4. Pt has had slight achiness but not severe and pt states not flu like.   Pt has some cough, runny nose and some chest congestion. Some mucous production on coughing.  Pt is not smoker.  Pt states mild sore throat. Some purulent colored mucous on blowing nose.   Review of Systems  Constitutional: Positive for fever. Negative for chills and fatigue.  HENT: Positive for congestion and sore throat. Negative for ear pain, rhinorrhea, sinus pressure and sneezing.   Respiratory: Positive for cough. Negative for choking, shortness of breath and wheezing.   Cardiovascular: Negative for chest pain and palpitations.  Musculoskeletal: Positive for myalgias. Negative for back pain.       Faint achiness.  Neurological: Negative for dizziness and headaches.  Hematological: Negative for adenopathy. Does not bruise/bleed easily.  Psychiatric/Behavioral: Negative for behavioral problems and confusion.    Past Medical History  Diagnosis Date  . Diabetes mellitus   . HIV (human immunodeficiency virus infection) (HCC)   . Hypercholesterolemia   . Neuropathy (HCC)   . GERD (gastroesophageal reflux disease)   . Hepatitis     hepatitis B  . Cholecystitis chronic   . Depression   . Belching   . Diarrhea   . Cancer [facility])     pt denies any dx of cancer  . Pneumonia     childhood  . Anxiety   . Seizures (HCC)     as a child, none since age 46  . Allergy   . Neuromuscular disorder (HCC)     neuropathy    Social History   Social History  . Marital Status: Single    Spouse Name: N/A  . Number of Children: N/A  . Years of Education: N/A   Occupational History  . Not on file.   Social History Main Topics  . Smoking status: Former Smoker -- 0.80 packs/day for 20 years    Types: Cigarettes    Quit date: 05/06/2006    . Smokeless tobacco: Never Used  . Alcohol Use: 0.5 oz/week    1 Standard drinks or equivalent per week     Comment: 1-2 drinks every week  . Drug Use: Yes    Special: Marijuana     Comment: last smoked marijuana 4 wekks ago per pt  . Sexual Activity:    Partners: Male     Comment: declined condoms   Other Topics Concern  . Not on file   Social History Narrative    Past Surgical History  Procedure Laterality Date  . Tonsillectomy  1973  . Joint replacement      bil total hips  . Cholecystectomy  05/29/2012    Procedure: LAPAROSCOPIC CHOLECYSTECTOMY WITH INTRAOPERATIVE CHOLANGIOGRAM;  Surgeon: Wilmon Arms. Corliss Skains, MD;  Location: [facility];  Service: General;  Laterality: N/A;    Family History  Problem Relation Age of Onset  . Cancer Mother     breast  . Cancer Maternal Grandmother     melanoma-nose  . Cancer Paternal Grandfather     melanoma-ear  . Colon cancer Neg Hx   . Esophageal cancer Neg Hx   . Rectal cancer Neg Hx   . Stomach cancer Neg Hx     Allergies  Allergen Reactions  . Phenytoin Other (See Comments)  Childhood pt unsure  . Metformin And Related Nausea And Vomiting    Current Outpatient Prescriptions on File Prior to Visit  Medication Sig Dispense Refill  . aspirin 81 MG tablet Take 81 mg by mouth daily.    . BD PEN NEEDLE NANO U/F 32G X 4 MM MISC     . buPROPion (WELLBUTRIN XL) 150 MG 24 hr tablet Take 1 tablet (150 mg total) by mouth daily. 30 tablet 6  . efavirenz-emtricitabine-tenofovir (ATRIPLA) 600-200-300 MG tablet Take 1 tablet by mouth at bedtime. 90 tablet 2  . fenofibrate 160 MG tablet TAKE 1 TABLET (160 MG TOTAL) BY MOUTH DAILY. 90 tablet 1  . glucose blood test strip 1 each by Other route 3 (three) times daily as needed. Use as directed.    . Liraglutide (VICTOZA) 18 MG/3ML SOPN Inject 0.3 mLs (1.8 mg total) into the skin daily. 9 mL 3  . lisinopril (PRINIVIL,ZESTRIL) 5 MG tablet TAKE 1 TABLET (5 MG TOTAL) BY MOUTH DAILY. 90 tablet 1  .  loratadine (CLARITIN) 10 MG tablet Take 10 mg by mouth daily.    . magnesium oxide (MAG-OX) 400 MG tablet Take 400 mg by mouth daily.    [name] DELICA LANCETS 33G MISC by Does not apply route.    . pantoprazole (PROTONIX) 40 MG tablet TAKE 1 TABLET (40 MG TOTAL) BY MOUTH DAILY. 90 tablet 1  . Probiotic Product (PROBIOTIC DAILY PO) Take by mouth.    . triamcinolone ointment (KENALOG) 0.1 % Apply topically 2 (two) times daily. 90 g 1   No current facility-administered medications on file prior to visit.    BP 108/74 mmHg  Pulse 78  Temp(Src) 98.1 F (36.7 C) (Oral)  Ht  (1.778 m)  Wt 162 lb (73.483 kg)  BMI 23.24 kg/m2  SpO2 98%       Objective:   Physical Exam  General  Mental Status - Alert. General Appearance - Well groomed. Not in acute distress.  Skin Rashes- No Rashes.  HEENT Head- Normal. Ear Auditory Canal - Left- Normal. Right - Normal.Tympanic Membrane- Left- Normal. Right- Normal. Eye Sclera/Conjunctiva- Left- Normal. Right- Normal. Nose & Sinuses Nasal Mucosa- Left-  Boggy and Congested. Right-  Boggy and  Congested.Bilateral  No maxillary and no  frontal sinus pressure. Mouth & Throat Lips: Upper Lip- Normal: no dryness, cracking, pallor, cyanosis, or vesicular eruption. Lower Lip-Normal: no dryness, cracking, pallor, cyanosis or vesicular eruption. Buccal Mucosa- Bilateral- No Aphthous ulcers. Oropharynx- No Discharge or Erythema. Tonsils: Characteristics- Bilateral- Moderate bright  Erythema and Congestion. Size/Enlargement- Bilateral- No enlargement. Discharge- bilateral-None.  Neck Neck- Supple. No Masses.   Chest and Lung Exam Auscultation: Breath Sounds:-Clear even and unlabored.  Cardiovascular Auscultation:Rythm- Regular, rate and rhythm. Murmurs & Other Heart Sounds:Ausculatation of the heart reveal- No Murmurs.  Lymphatic Head & Neck General Head & Neck Lymphatics: Bilateral: Description- Lt side submandibular node feels mild  swollen but he reports not tender       Assessment & Plan:  You present with some  symptoms overall of early bronchitis and your physical exam(and some component of history) indicates possible strep although rapid strep is negative.   To cover for both bronchitis and pharyngitis will prescribe azithromycin antibiotic For nasal congestion flonase. For cough benzonatate.  Follow up in 7 days or as needed

## 2015-12-23 NOTE — Patient Instructions (Addendum)
You present with some  symptoms overall of early bronchitis and your physical exam(and some component of history) indicates possible strep although rapid strep is negative.   To cover for both bronchitis and pharyngitis will prescribe azithromycin antibiotic For nasal congestion flonase. For cough benzonatate.  Follow up in 7 days or as needed

## 2015-12-23 NOTE — Progress Notes (Signed)
Pre visit review using our clinic review tool, if applicable. No additional management support is needed unless otherwise documented below in the visit note. 

## 2016-01-07 ENCOUNTER — Encounter: Payer: Self-pay | Admitting: Family Medicine

## 2016-01-07 ENCOUNTER — Ambulatory Visit (INDEPENDENT_AMBULATORY_CARE_PROVIDER_SITE_OTHER): Payer: BLUE CROSS/BLUE SHIELD | Admitting: Family Medicine

## 2016-01-07 VITALS — BP 112/84 | HR 90 | Temp 98.1°F | Resp 16 | Ht 70.0 in | Wt 158.1 lb

## 2016-01-07 DIAGNOSIS — E119 Type 2 diabetes mellitus without complications: Secondary | ICD-10-CM

## 2016-01-07 LAB — BASIC METABOLIC PANEL
BUN: 15 mg/dL (ref 6–23)
CHLORIDE: 101 meq/L (ref 96–112)
CO2: 28 meq/L (ref 19–32)
Calcium: 9.5 mg/dL (ref 8.4–10.5)
Creatinine, Ser: 0.95 mg/dL (ref 0.40–1.50)
GFR: 90.75 mL/min (ref >60.00)
GLUCOSE: 218 mg/dL — AB (ref 70–99)
POTASSIUM: 4 meq/L (ref 3.5–5.1)
SODIUM: 137 meq/L (ref 135–145)

## 2016-01-07 LAB — HEMOGLOBIN A1C: Hgb A1c MFr Bld: 7.9 % — ABNORMAL HIGH (ref 4.6–6.5)

## 2016-01-07 NOTE — Progress Notes (Signed)
   Subjective:    Patient ID: Frederick Abbott, male    DOB: 1971-01-09, 45 y.o.   MRN: [id number]  HPI DM- chronic problem, on Victoza daily.  Pt reports to poor dietary choices recently and expects A1C to be elevated.  Due for eye exam- had to reschedule last month's appt due to work training.  UTD on foot exam.  On ACE for renal protection.  Denies symptomatic lows.  No CP, SOB, HAs, visual changes, abd pain, edema, numbness/tingling of hands/feet.   Review of Systems For ROS see HPI     Objective:   Physical Exam  Constitutional: He is oriented to person, place, and time. He appears well-developed and well-nourished. No distress.  HENT:  Head: Normocephalic and atraumatic.  Eyes: Conjunctivae and EOM are normal. Pupils are equal, round, and reactive to light.  Neck: Normal range of motion. Neck supple. No thyromegaly present.  Cardiovascular: Normal rate, regular rhythm, normal heart sounds and intact distal pulses.   No murmur heard. Pulmonary/Chest: Effort normal and breath sounds normal. No respiratory distress.  Abdominal: Soft. Bowel sounds are normal. He exhibits no distension.  Musculoskeletal: He exhibits no edema.  Lymphadenopathy:    He has no cervical adenopathy.  Neurological: He is alert and oriented to person, place, and time. No cranial nerve deficit.  Skin: Skin is warm and dry.  Psychiatric: He has a normal mood and affect. His behavior is normal.  Vitals reviewed.         Assessment & Plan:

## 2016-01-07 NOTE — Progress Notes (Signed)
Pre visit review using our clinic review tool, if applicable. No additional management support is needed unless otherwise documented below in the visit note. 

## 2016-01-07 NOTE — Patient Instructions (Signed)
Follow up in 3-4 months to recheck diabetes and cholesterol We'll notify you of your lab results and make any changes if needed Try and make healthy food choices and get back to regular exercise Call with any questions or concerns If you want to join [place] at the new [place] office, any scheduled appointments will automatically transfer and we will see you at [address] [place] [address] [place], [place] [address] [facility]) Happy Early [place] Ouch!!!!

## 2016-01-07 NOTE — Assessment & Plan Note (Signed)
Chronic problem for pt.  Asymptomatic at this time.  Due for eye exam- plans to schedule.   Admits to poor dietary choices- reports he plans to do better.  Check labs.  Adjust meds prn

## 2016-01-12 ENCOUNTER — Other Ambulatory Visit: Payer: Self-pay | Admitting: Family Medicine

## 2016-01-12 NOTE — Telephone Encounter (Signed)
Medication filled to pharmacy as requested.   

## 2016-01-17 ENCOUNTER — Other Ambulatory Visit: Payer: Self-pay | Admitting: Family Medicine

## 2016-01-19 NOTE — Telephone Encounter (Signed)
Medication filled to pharmacy as requested.   

## 2016-04-07 ENCOUNTER — Other Ambulatory Visit: Payer: Self-pay | Admitting: General Practice

## 2016-04-07 MED ORDER — LIRAGLUTIDE 18 MG/3ML ~~LOC~~ SOPN: 1.8000 mg | PEN_INJECTOR | Freq: Every day | SUBCUTANEOUS | Status: DC

## 2016-04-07 MED ORDER — LISINOPRIL 5 MG PO TABS: ORAL_TABLET | ORAL | Status: DC

## 2016-04-08 ENCOUNTER — Other Ambulatory Visit: Payer: BLUE CROSS/BLUE SHIELD

## 2016-04-08 DIAGNOSIS — B20 Human immunodeficiency virus [HIV] disease: Secondary | ICD-10-CM

## 2016-04-08 LAB — COMPREHENSIVE METABOLIC PANEL
ALBUMIN: 4.6 g/dL (ref 3.6–5.1)
ALT: 92 U/L — ABNORMAL HIGH (ref 9–46)
AST: 120 U/L — ABNORMAL HIGH (ref 10–40)
Alkaline Phosphatase: 88 U/L (ref 40–115)
BUN: 13 mg/dL (ref 7–25)
CALCIUM: 9.5 mg/dL (ref 8.6–10.3)
CHLORIDE: 101 mmol/L (ref 98–110)
CO2: 24 mmol/L (ref 20–31)
Creat: 1.07 mg/dL (ref 0.60–1.35)
Glucose, Bld: 174 mg/dL — ABNORMAL HIGH (ref 65–99)
POTASSIUM: 3.9 mmol/L (ref 3.5–5.3)
Sodium: 138 mmol/L (ref 135–146)
TOTAL PROTEIN: 6.8 g/dL (ref 6.1–8.1)
Total Bilirubin: 0.4 mg/dL (ref 0.2–1.2)

## 2016-04-08 LAB — LIPID PANEL
CHOL/HDL RATIO: 4.7 ratio (ref <=5.0)
CHOLESTEROL: 233 mg/dL — AB (ref 125–200)
HDL: 50 mg/dL (ref >=40)
LDL Cholesterol: 118 mg/dL (ref <130)
TRIGLYCERIDES: 327 mg/dL — AB (ref <150)
VLDL: 65 mg/dL — ABNORMAL HIGH (ref <30)

## 2016-04-08 LAB — CBC
HEMATOCRIT: 46 % (ref 38.5–50.0)
Hemoglobin: 15.9 g/dL (ref 13.2–17.1)
MCH: 30.3 pg (ref 27.0–33.0)
MCHC: 34.6 g/dL (ref 32.0–36.0)
MCV: 87.6 fL (ref 80.0–100.0)
MPV: 9.5 fL (ref 7.5–12.5)
PLATELETS: 287 10*3/uL (ref 140–400)
RBC: 5.25 MIL/uL (ref 4.20–5.80)
RDW: 13.3 % (ref 11.0–15.0)
WBC: 7.7 10*3/uL (ref 3.8–10.8)

## 2016-04-09 LAB — T-HELPER CELL (CD4) - (RCID CLINIC ONLY)
CD4 % Helper T Cell: 39 % (ref 33–55)
CD4 T CELL ABS: 1120 /uL (ref 400–2700)

## 2016-04-09 LAB — RPR

## 2016-04-12 LAB — HIV-1 RNA QUANT-NO REFLEX-BLD
HIV 1 RNA QUANT: 53 {copies}/mL — AB (ref <20)
HIV-1 RNA Quant, Log: 1.72 Log copies/mL — ABNORMAL HIGH (ref <1.30)

## 2016-04-15 LAB — HLA B*5701: HLA-B*5701 w/rflx HLA-B High: NEGATIVE

## 2016-04-22 ENCOUNTER — Encounter: Payer: Self-pay | Admitting: Family Medicine

## 2016-04-22 ENCOUNTER — Ambulatory Visit (INDEPENDENT_AMBULATORY_CARE_PROVIDER_SITE_OTHER): Payer: BLUE CROSS/BLUE SHIELD | Admitting: Internal Medicine

## 2016-04-22 ENCOUNTER — Ambulatory Visit (INDEPENDENT_AMBULATORY_CARE_PROVIDER_SITE_OTHER): Payer: BLUE CROSS/BLUE SHIELD | Admitting: Family Medicine

## 2016-04-22 ENCOUNTER — Ambulatory Visit: Payer: BLUE CROSS/BLUE SHIELD | Admitting: *Deleted

## 2016-04-22 ENCOUNTER — Encounter: Payer: Self-pay | Admitting: Internal Medicine

## 2016-04-22 VITALS — BP 118/80 | HR 61 | Temp 98.1°F | Resp 16 | Ht 70.0 in | Wt 157.5 lb

## 2016-04-22 DIAGNOSIS — R945 Abnormal results of liver function studies: Secondary | ICD-10-CM

## 2016-04-22 DIAGNOSIS — E785 Hyperlipidemia, unspecified: Secondary | ICD-10-CM | POA: Diagnosis not present

## 2016-04-22 DIAGNOSIS — B20 Human immunodeficiency virus [HIV] disease: Secondary | ICD-10-CM

## 2016-04-22 DIAGNOSIS — R7989 Other specified abnormal findings of blood chemistry: Secondary | ICD-10-CM

## 2016-04-22 DIAGNOSIS — E119 Type 2 diabetes mellitus without complications: Secondary | ICD-10-CM | POA: Diagnosis not present

## 2016-04-22 DIAGNOSIS — F411 Generalized anxiety disorder: Secondary | ICD-10-CM

## 2016-04-22 LAB — HEPATIC FUNCTION PANEL
ALK PHOS: 91 U/L (ref 39–117)
ALT: 64 U/L — ABNORMAL HIGH (ref 0–53)
AST: 62 U/L — ABNORMAL HIGH (ref 0–37)
Albumin: 4.6 g/dL (ref 3.5–5.2)
BILIRUBIN DIRECT: 0.1 mg/dL (ref 0.0–0.3)
BILIRUBIN TOTAL: 0.7 mg/dL (ref 0.2–1.2)
Total Protein: 6.8 g/dL (ref 6.0–8.3)

## 2016-04-22 LAB — HEMOGLOBIN A1C: Hgb A1c MFr Bld: 9.6 % — ABNORMAL HIGH (ref 4.6–6.5)

## 2016-04-22 MED ORDER — ELVITEG-COBIC-EMTRICIT-TENOFAF 150-150-200-10 MG PO TABS: 1.0000 | ORAL_TABLET | Freq: Every day | ORAL | Status: DC

## 2016-04-22 NOTE — Progress Notes (Signed)
Pre visit review using our clinic review tool, if applicable. No additional management support is needed unless otherwise documented below in the visit note. 

## 2016-04-22 NOTE — Patient Instructions (Signed)
Follow up in 3-4 months to recheck diabetes and cholesterol We'll notify you of your lab results and make any changes if needed Continue to work on healthy diet and regular exercise- you look great! If the liver enzymes are lower, we'll start a cholesterol medication.  If not, we'll get an ultrasound to look at the liver Call with any questions or concerns Have a great summer!!!

## 2016-04-22 NOTE — Assessment & Plan Note (Signed)
He is ready to start to a new, safer antiretroviral combination. We discussed options with him and [facility] will [facility] taken each evening with his dinner. He will follow-up after lab work in 6 months.

## 2016-04-22 NOTE — Progress Notes (Signed)
   Subjective:    Patient ID: Frederick Abbott, male    DOB: 04/26/1971, [age] y.o.   MRN: [id number]  HPI Hyperlipidemia- chronic problem, on Fenofibrate.  Recent lipids were total- 233, Trigs-327, LDL- 118.  No abd pain, N/V.    DM- chronic problem, on Victoza.  On ACE for renal protection.  UTD on foot exam.  Due for eye exam.  Sugar yesterday was 174 on CMP.  'i am getting back on the right track'.  Admits that he was not eating well or exercising but attempting to change this.  Pt reports sugar was up to 433 one day and this scared him.  Yesterday was 137, 109.  No CP, SOB, HAs, visual changes, symptomatic lows, numbness/tingling of hands/feet.  Elevated liver enzymes- AST 120, ALT 92 on 6/1.  This is higher than previous.  Pt never had abd US done as ordered.   Review of Systems For ROS see HPI     Objective:   Physical Exam  Constitutional: He is oriented to person, place, and time. He appears well-developed and well-nourished. No distress.  HENT:  Head: Normocephalic and atraumatic.  Eyes: Conjunctivae and EOM are normal. Pupils are equal, round, and reactive to light.  Neck: Normal range of motion. Neck supple. No thyromegaly present.  Cardiovascular: Normal rate, regular rhythm, normal heart sounds and intact distal pulses.   No murmur heard. Pulmonary/Chest: Effort normal and breath sounds normal. No respiratory distress.  Abdominal: Soft. Bowel sounds are normal. He exhibits no distension.  Musculoskeletal: He exhibits no edema.  Lymphadenopathy:    He has no cervical adenopathy.  Neurological: He is alert and oriented to person, place, and time. No cranial nerve deficit.  Skin: Skin is warm and dry.  Psychiatric: He has a normal mood and affect. His behavior is normal.  Vitals reviewed.         Assessment & Plan:

## 2016-04-22 NOTE — Progress Notes (Signed)
Patient Active Problem List   Diagnosis Date Noted  . DM II (diabetes mellitus, type II), controlled (HCC) 12/11/2009    Priority: High  . Hyperlipidemia 08/20/2008    Priority: High  . Human immunodeficiency virus (HIV) disease (HCC) 06/01/2007    Priority: High  . Rectal bleeding 12/27/2014  . Nausea with vomiting 12/16/2014  . Cough 12/16/2014  . Elevated LFTs 12/16/2014  . Left wrist pain 09/16/2014  . Wart 08/15/2014  . Eustachian tube dysfunction 08/09/2013  . UTI (urinary tract infection) 08/09/2013  . Routine general medical examination at a health care facility 02/07/2013  . Dyshidrotic eczema 02/07/2013  . Chronic cholecystitis with calculus 05/09/2012  . Heel pain 09/23/2011  . DECREASED HEARING, BILATERAL 09/18/2009  . LOW BACK PAIN, CHRONIC 10/17/2008  . VENEREAL WART 08/20/2008  . GERD 06/05/2007  . Anxiety state 06/01/2007  . DEPRESSION 06/01/2007  . ALLERGIC RHINITIS 06/01/2007  . CARBUNCLE/FURUNCLE NOS 06/01/2007  . PSORIASIS NEC 06/01/2007  . NECROSIS, ASEPTIC, FEMUR HEAD/NECK 06/01/2007  . SEIZURE DISORDER 06/01/2007  . HEPATITIS B, HX OF 06/01/2007    Patient's Medications  New Prescriptions   ELVITEGRAVIR-COBICISTAT-EMTRICITABINE-TENOFOVIR (GENVOYA) 150-150-200-10 MG TABS TABLET    Take 1 tablet by mouth daily with breakfast.  Previous Medications   ASPIRIN 81 MG TABLET    Take 81 mg by mouth daily.   BD PEN NEEDLE NANO U/F 32G X 4 MM MISC       BUPROPION (WELLBUTRIN XL) 150 MG 24 HR TABLET    Take 1 tablet (150 mg total) by mouth daily.   FENOFIBRATE 160 MG TABLET    TAKE 1 TABLET (160 MG TOTAL) BY MOUTH DAILY.   FLUTICASONE (FLONASE) 50 MCG/ACT NASAL SPRAY    Place 2 sprays into both nostrils daily.   GLUCOSE BLOOD TEST STRIP    1 each by Other route 3 (three) times daily as needed. Use as directed.   LIRAGLUTIDE (VICTOZA) 18 MG/3ML SOPN    Inject 0.3 mLs (1.8 mg total) into the skin daily.   LISINOPRIL (PRINIVIL,ZESTRIL) 5 MG TABLET     TAKE 1 TABLET (5 MG TOTAL) BY MOUTH DAILY.   LORATADINE (CLARITIN) 10 MG TABLET    Take 10 mg by mouth daily.   MAGNESIUM OXIDE (MAG-OX) 400 MG TABLET    Take 400 mg by mouth daily.   ONETOUCH DELICA LANCETS 33G MISC    by Does not apply route.   PANTOPRAZOLE (PROTONIX) 40 MG TABLET    TAKE 1 TABLET (40 MG TOTAL) BY MOUTH 2 (TWO) TIMES DAILY.   PROBIOTIC PRODUCT (PROBIOTIC DAILY PO)    Take by mouth.  Modified Medications   No medications on file  Discontinued Medications   ASCORBIC ACID (VITAMIN C) 500 MG CAPS    Take by mouth.   CYANOCOBALAMIN (VITAMIN B-12 CR PO)    Take by mouth.   EFAVIRENZ-EMTRICITABINE-TENOFOVIR (ATRIPLA) 600-200-300 MG TABLET    Take 1 tablet by mouth at bedtime.   THIAMINE HCL (VITAMIN B-1 PO)    Take by mouth.   TRIAMCINOLONE OINTMENT (KENALOG) 0.1 %    Apply topically 2 (two) times daily.    Subjective: Frederick Abbott is in for his routine HIV follow-up visit. 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 He has had no problems with his Atripla and does not miss doses.   Review of Systems: Review of Systems  Constitutional:       He  is in very good spirits.  Psychiatric/Behavioral: Negative for depression. The patient is not nervous/anxious.     Past Medical History  Diagnosis Date  . Diabetes mellitus   . HIV (human immunodeficiency virus infection) (HCC)   . Hypercholesterolemia   . Neuropathy (HCC)   . GERD (gastroesophageal reflux disease)   . Hepatitis     hepatitis B  . Cholecystitis chronic   . Depression   . Belching   . Diarrhea   . Cancer [facility])     pt denies any dx of cancer  . Pneumonia     childhood  . Anxiety   . Seizures (HCC)     as a child, none since age 4  . Allergy   . Neuromuscular disorder (HCC)     neuropathy    Social History  Substance Use Topics  . Smoking status: Former Smoker -- 0.80 packs/day for 20 years    Types: Cigarettes    Quit date: 05/06/2006  . Smokeless tobacco: Never Used  .  Alcohol Use: 0.5 oz/week    1 Standard drinks or equivalent per week     Comment: 1-2 drinks every week    Family History  Problem Relation Age of Onset  . Cancer Mother     breast  . Cancer Maternal Grandmother     melanoma-nose  . Cancer Paternal Grandfather     melanoma-ear  . Colon cancer Neg Hx   . Esophageal cancer Neg Hx   . Rectal cancer Neg Hx   . Stomach cancer Neg Hx     Allergies  Allergen Reactions  . Phenytoin Other (See Comments)    Childhood pt unsure  . Metformin And Related Nausea And Vomiting    Objective:  Filed Vitals:   04/22/16 0903  BP: 123/81  Pulse: 83  Temp: 97.9 F (36.6 C)  TempSrc: Oral  Height: 5\' 10"  (1.778 m)  Weight: 158 lb (71.668 kg)   Body mass index is 22.67 kg/(m^2).  Physical Exam  Constitutional: He is oriented to person, place, and time. No distress.  Neurological: He is alert and oriented to person, place, and time.  Skin: No rash noted.  Psychiatric: Mood and affect normal.    Lab Results Lab Results  Component Value Date   WBC 7.7 04/08/2016   HGB 15.9 04/08/2016   HCT 46.0 04/08/2016   MCV 87.6 04/08/2016   PLT 287 04/08/2016    Lab Results  Component Value Date   CREATININE 1.07 04/08/2016   BUN 13 04/08/2016   NA 138 04/08/2016   K 3.9 04/08/2016   CL 101 04/08/2016   CO2 24 04/08/2016    Lab Results  Component Value Date   ALT 92* 04/08/2016   AST 120* 04/08/2016   ALKPHOS 88 04/08/2016   BILITOT 0.4 04/08/2016    Lab Results  Component Value Date   CHOL 233* 04/08/2016   HDL 50 04/08/2016   LDLCALC 118 04/08/2016   LDLDIRECT 129.8 08/15/2014   TRIG 327* 04/08/2016   CHOLHDL 4.7 04/08/2016    Lab Results HIV 1 RNA QUANT (copies/mL)  Date Value  04/08/2016 53*  10/09/2015 <20  09/19/2014 <20   CD4 T CELL ABS (/uL)  Date Value  04/08/2016 1120  10/09/2015 1290  09/19/2014 1210      Problem List Items Addressed This Visit      High   Human immunodeficiency virus (HIV)  disease (HCC)    He is ready to start to a new,  safer antiretroviral combination. We discussed options with him and he will switch to [facility] taken each evening with his dinner. He will follow-up after lab work in 6 months.      Relevant Medications   elvitegravir-cobicistat-emtricitabine-tenofovir (GENVOYA) 150-150-200-10 MG TABS tablet   Other Relevant Orders   T-helper cell (CD4)- ([facility] clinic only)   HIV 1 RNA quant-no reflex-bld   CBC   Comprehensive metabolic panel   Lipid panel   RPR        Cliffton Asters, MD [facility] [phone] pager   [phone] cell 04/22/2016, 9:40 AM

## 2016-04-22 NOTE — BH Specialist Note (Signed)
Counselor met with Frederick Abbott in the exam room today for a warm hand off.  Patient was oriented times four with good affect and dress.  Patient was in good spirits and shared that everything in his life right now anyway's was going pretty well.  Patient stated that he has had issues off and on his whole life with anxiety and accepting who he was a "homosexual'.  Patient indicated that he would call and make an appointment with counselor as the need came up.   [name], MA, [facility]

## 2016-04-23 ENCOUNTER — Other Ambulatory Visit: Payer: Self-pay | Admitting: General Practice

## 2016-04-23 MED ORDER — EMPAGLIFLOZIN 10 MG PO TABS: 10.0000 mg | ORAL_TABLET | Freq: Every day | ORAL | Status: DC

## 2016-04-23 NOTE — Assessment & Plan Note (Signed)
New.  Labs done by ID show elevated LFTs.  Pt was not aware of this.  Repeat liver functions today.  If still elevated, will need US.  Will follow closely.

## 2016-04-23 NOTE — Assessment & Plan Note (Signed)
Chronic problem.  Recent labs show that total cholesterol, LDL and trigs are all above goal.  He needs to start a statin but will need to repeat LFTs due to elevated AST/ALT prior to starting medication.  Stressed need to focus on healthy diet and regular exercise.  Will follow.

## 2016-04-23 NOTE — Assessment & Plan Note (Signed)
Chronic problem.  Pt's recent labs show elevated glucose and pt admits to poor diet and limited exercise.  UTD on foot exam, on ACE for renal protection.  Due for eye exam.  Pt encouraged to schedule.  Check labs.  Adjust meds prn

## 2016-04-28 ENCOUNTER — Encounter: Payer: Self-pay | Admitting: Internal Medicine

## 2016-04-28 ENCOUNTER — Other Ambulatory Visit: Payer: Self-pay | Admitting: *Deleted

## 2016-04-28 DIAGNOSIS — B20 Human immunodeficiency virus [HIV] disease: Secondary | ICD-10-CM

## 2016-04-28 MED ORDER — ELVITEG-COBIC-EMTRICIT-TENOFAF 150-150-200-10 MG PO TABS: 1.0000 | ORAL_TABLET | Freq: Every day | ORAL | Status: DC

## 2016-04-28 NOTE — Telephone Encounter (Signed)
[facility] to [facility]

## 2016-07-02 ENCOUNTER — Other Ambulatory Visit: Payer: Self-pay | Admitting: Family Medicine

## 2016-07-04 ENCOUNTER — Other Ambulatory Visit: Payer: Self-pay | Admitting: Family Medicine

## 2016-07-09 ENCOUNTER — Other Ambulatory Visit: Payer: Self-pay | Admitting: Family Medicine

## 2016-07-31 ENCOUNTER — Other Ambulatory Visit: Payer: Self-pay | Admitting: Family Medicine

## 2016-08-17 ENCOUNTER — Ambulatory Visit (INDEPENDENT_AMBULATORY_CARE_PROVIDER_SITE_OTHER): Payer: BLUE CROSS/BLUE SHIELD | Admitting: Family Medicine

## 2016-08-17 ENCOUNTER — Encounter: Payer: Self-pay | Admitting: Family Medicine

## 2016-08-17 VITALS — BP 123/84 | HR 81 | Temp 97.9°F | Resp 16 | Ht 70.0 in | Wt 161.4 lb

## 2016-08-17 DIAGNOSIS — E782 Mixed hyperlipidemia: Secondary | ICD-10-CM

## 2016-08-17 DIAGNOSIS — E119 Type 2 diabetes mellitus without complications: Secondary | ICD-10-CM

## 2016-08-17 DIAGNOSIS — Z23 Encounter for immunization: Secondary | ICD-10-CM | POA: Diagnosis not present

## 2016-08-17 LAB — CBC WITH DIFFERENTIAL/PLATELET
BASOS PCT: 0.4 % (ref 0.0–3.0)
Basophils Absolute: 0 10*3/uL (ref 0.0–0.1)
EOS ABS: 0.2 10*3/uL (ref 0.0–0.7)
EOS PCT: 3.1 % (ref 0.0–5.0)
HEMATOCRIT: 45.2 % (ref 39.0–52.0)
HEMOGLOBIN: 15.4 g/dL (ref 13.0–17.0)
LYMPHS PCT: 34 % (ref 12.0–46.0)
Lymphs Abs: 2.5 10*3/uL (ref 0.7–4.0)
MCHC: 34.2 g/dL (ref 30.0–36.0)
MCV: 89.4 fl (ref 78.0–100.0)
Monocytes Absolute: 0.6 10*3/uL (ref 0.1–1.0)
Monocytes Relative: 8.6 % (ref 3.0–12.0)
NEUTROS ABS: 3.9 10*3/uL (ref 1.4–7.7)
Neutrophils Relative %: 53.9 % (ref 43.0–77.0)
PLATELETS: 271 10*3/uL (ref 150.0–400.0)
RBC: 5.05 Mil/uL (ref 4.22–5.81)
RDW: 12.8 % (ref 11.5–15.5)
WBC: 7.3 10*3/uL (ref 4.0–10.5)

## 2016-08-17 LAB — LIPID PANEL
CHOL/HDL RATIO: 5
Cholesterol: 210 mg/dL — ABNORMAL HIGH (ref 0–200)
HDL: 40.8 mg/dL (ref >39.00)
NONHDL: 169.13
Triglycerides: 343 mg/dL — ABNORMAL HIGH (ref 0.0–149.0)
VLDL: 68.6 mg/dL — ABNORMAL HIGH (ref 0.0–40.0)

## 2016-08-17 LAB — BASIC METABOLIC PANEL
BUN: 14 mg/dL (ref 6–23)
CALCIUM: 9.1 mg/dL (ref 8.4–10.5)
CHLORIDE: 102 meq/L (ref 96–112)
CO2: 26 meq/L (ref 19–32)
CREATININE: 1.01 mg/dL (ref 0.40–1.50)
GFR: 84.33 mL/min (ref >60.00)
Glucose, Bld: 286 mg/dL — ABNORMAL HIGH (ref 70–99)
Potassium: 4 mEq/L (ref 3.5–5.1)
SODIUM: 137 meq/L (ref 135–145)

## 2016-08-17 LAB — HEPATIC FUNCTION PANEL
ALT: 46 U/L (ref 0–53)
AST: 39 U/L — AB (ref 0–37)
Albumin: 4 g/dL (ref 3.5–5.2)
Alkaline Phosphatase: 87 U/L (ref 39–117)
BILIRUBIN DIRECT: 0.1 mg/dL (ref 0.0–0.3)
BILIRUBIN TOTAL: 0.8 mg/dL (ref 0.2–1.2)
Total Protein: 6.2 g/dL (ref 6.0–8.3)

## 2016-08-17 LAB — TSH: TSH: 1.76 u[IU]/mL (ref 0.35–4.50)

## 2016-08-17 LAB — LDL CHOLESTEROL, DIRECT: Direct LDL: 104 mg/dL

## 2016-08-17 LAB — HEMOGLOBIN A1C: Hgb A1c MFr Bld: 9.6 % — ABNORMAL HIGH (ref 4.6–6.5)

## 2016-08-17 MED ORDER — EMPAGLIFLOZIN 10 MG PO TABS: 10.0000 mg | ORAL_TABLET | Freq: Every day | ORAL | 3 refills | Status: DC

## 2016-08-17 NOTE — Assessment & Plan Note (Signed)
Deteriorated.  Recent A1C showed very poor control (9.6).  Pt never started the Jardiance that was prescribed for him.  Home CBGs are mostly >200 so will plan to start the Jardiance in addition to the Victoza as I suspect the A1C will remain well above goal.  Stressed need for him to schedule an eye exam.  UTD on foot exam.  On ACE for renal protection.  Pt expressed understanding and is in agreement w/ plan.

## 2016-08-17 NOTE — Patient Instructions (Signed)
Schedule your complete physical in 3 months We'll notify you of your lab results and make any changes if needed Continue to work on a low carb diet and get regular exercise Start the Jardiance once daily in addition to your Victoza to improve your sugar control SCHEDULE YOUR EYE EXAM!! Call with any questions or concerns Happy Fall!!!

## 2016-08-17 NOTE — Assessment & Plan Note (Signed)
Chronic problem.  Pt is currently on fenofibrate for elevated triglycerides but not currently on a statin due to hx of elevated LFTs.  Check labs.  Start medication if needed. Stressed need for healthy diet and regular exercise. Will follow.

## 2016-08-17 NOTE — Progress Notes (Signed)
   Subjective:    Patient ID: Marcellina MillinChristopher R Rohwer, male    DOB: 09/22/1971, 45 y.o.   MRN: [id number]  HPI DM- chronic problem.  A1C was poorly controlled at last visit (9.6).  At this time, [place] PepperJardiance was added to his Victoza but he never started this.  On ACE for renal protection.  Overdue for eye exam- pt plans to call and schedule.  UTD on foot exam.  Pt is working hard on changing diet.  Home CBGs >200.  Denies symptomatic lows.  Denies CP, SOB, HAs, visual changes, edema, abd pain, N/V.  No numbness/tingling of hands/feet.  Hyperlipidemia- chronic problem, on fenofibrate.  Not currently on statin due to hx of elevated liver enzymes.     Review of Systems For ROS see HPI     Objective:   Physical Exam  Constitutional: He is oriented to person, place, and time. He appears well-developed and well-nourished. No distress.  HENT:  Head: Normocephalic and atraumatic.  Eyes: Conjunctivae and EOM are normal. Pupils are equal, round, and reactive to light.  Neck: Normal range of motion. Neck supple. No thyromegaly present.  Cardiovascular: Normal rate, regular rhythm, normal heart sounds and intact distal pulses.   No murmur heard. Pulmonary/Chest: Effort normal and breath sounds normal. No respiratory distress.  Abdominal: Soft. Bowel sounds are normal. He exhibits no distension.  Musculoskeletal: He exhibits no edema.  Lymphadenopathy:    He has no cervical adenopathy.  Neurological: He is alert and oriented to person, place, and time. No cranial nerve deficit.  Skin: Skin is warm and dry.  Psychiatric: He has a normal mood and affect. His behavior is normal.  Vitals reviewed.         Assessment & Plan:

## 2016-08-17 NOTE — Progress Notes (Signed)
Pre visit review using our clinic review tool, if applicable. No additional management support is needed unless otherwise documented below in the visit note. 

## 2016-08-18 ENCOUNTER — Encounter: Payer: Self-pay | Admitting: Family Medicine

## 2016-08-18 ENCOUNTER — Other Ambulatory Visit: Payer: Self-pay | Admitting: General Practice

## 2016-08-18 DIAGNOSIS — E785 Hyperlipidemia, unspecified: Secondary | ICD-10-CM

## 2016-08-18 MED ORDER — ATORVASTATIN CALCIUM 10 MG PO TABS: 10.0000 mg | ORAL_TABLET | Freq: Every evening | ORAL | 6 refills | Status: DC

## 2016-08-20 ENCOUNTER — Other Ambulatory Visit: Payer: Self-pay | Admitting: Emergency Medicine

## 2016-08-20 MED ORDER — BUPROPION HCL ER (XL) 150 MG PO TB24: 150.0000 mg | ORAL_TABLET | Freq: Every day | ORAL | 1 refills | Status: DC

## 2016-08-24 ENCOUNTER — Other Ambulatory Visit: Payer: Self-pay | Admitting: Family Medicine

## 2016-09-23 ENCOUNTER — Other Ambulatory Visit: Payer: Self-pay | Admitting: Family Medicine

## 2016-09-27 ENCOUNTER — Other Ambulatory Visit: Payer: Self-pay | Admitting: Family Medicine

## 2016-10-03 ENCOUNTER — Other Ambulatory Visit: Payer: Self-pay | Admitting: Family Medicine

## 2016-10-12 ENCOUNTER — Other Ambulatory Visit: Payer: BLUE CROSS/BLUE SHIELD

## 2016-10-12 DIAGNOSIS — B20 Human immunodeficiency virus [HIV] disease: Secondary | ICD-10-CM

## 2016-10-12 LAB — COMPREHENSIVE METABOLIC PANEL
ALBUMIN: 4.2 g/dL (ref 3.6–5.1)
ALT: 32 U/L (ref 9–46)
AST: 26 U/L (ref 10–40)
Alkaline Phosphatase: 85 U/L (ref 40–115)
BILIRUBIN TOTAL: 0.6 mg/dL (ref 0.2–1.2)
BUN: 15 mg/dL (ref 7–25)
CALCIUM: 9.1 mg/dL (ref 8.6–10.3)
CHLORIDE: 105 mmol/L (ref 98–110)
CO2: 25 mmol/L (ref 20–31)
Creat: 1.25 mg/dL (ref 0.60–1.35)
GLUCOSE: 217 mg/dL — AB (ref 65–99)
POTASSIUM: 4.5 mmol/L (ref 3.5–5.3)
Sodium: 138 mmol/L (ref 135–146)
Total Protein: 6.4 g/dL (ref 6.1–8.1)

## 2016-10-12 LAB — LIPID PANEL
CHOL/HDL RATIO: 3.2 ratio (ref <5.0)
Cholesterol: 152 mg/dL (ref <200)
HDL: 48 mg/dL (ref >40)
LDL CALC: 78 mg/dL (ref <100)
TRIGLYCERIDES: 129 mg/dL (ref <150)
VLDL: 26 mg/dL (ref <30)

## 2016-10-12 LAB — CBC
HEMATOCRIT: 48.3 % (ref 38.5–50.0)
HEMOGLOBIN: 16.1 g/dL (ref 13.2–17.1)
MCH: 30.8 pg (ref 27.0–33.0)
MCHC: 33.3 g/dL (ref 32.0–36.0)
MCV: 92.4 fL (ref 80.0–100.0)
MPV: 10 fL (ref 7.5–12.5)
Platelets: 306 10*3/uL (ref 140–400)
RBC: 5.23 MIL/uL (ref 4.20–5.80)
RDW: 12.5 % (ref 11.0–15.0)
WBC: 7.8 10*3/uL (ref 3.8–10.8)

## 2016-10-13 LAB — RPR

## 2016-10-13 LAB — T-HELPER CELL (CD4) - (RCID CLINIC ONLY)
CD4 T CELL ABS: 1080 /uL (ref 400–2700)
CD4 T CELL HELPER: 35 % (ref 33–55)

## 2016-10-14 LAB — HIV-1 RNA QUANT-NO REFLEX-BLD
HIV 1 RNA Quant: <20 copies/mL (ref <20)
HIV-1 RNA Quant, Log: <1.3 Log copies/mL (ref <1.30)

## 2016-10-15 ENCOUNTER — Other Ambulatory Visit: Payer: Self-pay | Admitting: General Practice

## 2016-10-15 MED ORDER — EMPAGLIFLOZIN 10 MG PO TABS: 10.0000 mg | ORAL_TABLET | Freq: Every day | ORAL | 1 refills | Status: DC

## 2016-10-26 ENCOUNTER — Ambulatory Visit (INDEPENDENT_AMBULATORY_CARE_PROVIDER_SITE_OTHER): Payer: BLUE CROSS/BLUE SHIELD | Admitting: Internal Medicine

## 2016-10-26 ENCOUNTER — Encounter: Payer: Self-pay | Admitting: Internal Medicine

## 2016-10-26 DIAGNOSIS — B20 Human immunodeficiency virus [HIV] disease: Secondary | ICD-10-CM | POA: Diagnosis not present

## 2016-10-26 NOTE — Assessment & Plan Note (Signed)
His infection remains under excellent, long-term control. He will continue Genvoya in follow-up after lab work in one year. 

## 2016-10-26 NOTE — Progress Notes (Signed)
Patient Active Problem List   Diagnosis Date Noted  . DM II (diabetes mellitus, type II), controlled (HCC) 12/11/2009    Priority: High  . Hyperlipidemia 08/20/2008    Priority: High  . Human immunodeficiency virus (HIV) disease (HCC) 06/01/2007    Priority: High  . Rectal bleeding 12/27/2014  . Nausea with vomiting 12/16/2014  . Cough 12/16/2014  . Elevated LFTs 12/16/2014  . Left wrist pain 09/16/2014  . Wart 08/15/2014  . Eustachian tube dysfunction 08/09/2013  . UTI (urinary tract infection) 08/09/2013  . Routine general medical examination at a health care facility 02/07/2013  . Dyshidrotic eczema 02/07/2013  . Chronic cholecystitis with calculus 05/09/2012  . Heel pain 09/23/2011  . DECREASED HEARING, BILATERAL 09/18/2009  . LOW BACK PAIN, CHRONIC 10/17/2008  . VENEREAL WART 08/20/2008  . GERD 06/05/2007  . Anxiety state 06/01/2007  . DEPRESSION 06/01/2007  . ALLERGIC RHINITIS 06/01/2007  . CARBUNCLE/FURUNCLE NOS 06/01/2007  . PSORIASIS NEC 06/01/2007  . NECROSIS, ASEPTIC, FEMUR HEAD/NECK 06/01/2007  . SEIZURE DISORDER 06/01/2007  . HEPATITIS B, HX OF 06/01/2007    Patient's Medications  New Prescriptions   No medications on file  Previous Medications   ASPIRIN 81 MG TABLET    Take 81 mg by mouth daily.   ATORVASTATIN (LIPITOR) 10 MG TABLET    Take 1 tablet (10 mg total) by mouth Nightly.   BD PEN NEEDLE NANO U/F 32G X 4 MM MISC    USE 1 NEEDLE TO INJECT 24 UNITS INTO THE SKIN DAILY.   BUPROPION (WELLBUTRIN XL) 150 MG 24 HR TABLET    Take 1 tablet (150 mg total) by mouth daily.   ELVITEGRAVIR-COBICISTAT-EMTRICITABINE-TENOFOVIR (GENVOYA) 150-150-200-10 MG TABS TABLET    Take 1 tablet by mouth daily with breakfast.   EMPAGLIFLOZIN (JARDIANCE) 10 MG TABS TABLET    Take 10 mg by mouth daily.   FENOFIBRATE 160 MG TABLET    TAKE 1 TABLET (160 MG TOTAL) BY MOUTH DAILY.   GLUCOSE BLOOD TEST STRIP    1 each by Other route 3 (three) times daily as needed. Use  as directed.    LISINOPRIL (PRINIVIL,ZESTRIL) 5 MG TABLET    TAKE 1 TABLET (5 MG TOTAL) BY MOUTH DAILY.   LORATADINE (CLARITIN) 10 MG TABLET    Take 10 mg by mouth daily.   MAGNESIUM OXIDE (MAG-OX) 400 MG TABLET    Take 400 mg by mouth daily.   ONDANSETRON (ZOFRAN) 4 MG TABLET    Take 4 mg by mouth every 8 (eight) hours as needed for nausea or vomiting.   ONETOUCH DELICA LANCETS 33G MISC    by Does not apply route.   PANTOPRAZOLE (PROTONIX) 40 MG TABLET    TAKE 1 TABLET (40 MG TOTAL) BY MOUTH 2 (TWO) TIMES DAILY.   PROBIOTIC PRODUCT (PROBIOTIC DAILY PO)    Take by mouth.   VICTOZA 18 MG/3ML SOPN    INJECT 0.3 MLS (1.8 MG TOTAL) INTO THE SKIN DAILY.  Modified Medications   No medications on file  Discontinued Medications   No medications on file    Subjective: [name] is in for his routine HIV follow-up visit. He guesses that he has missed only 1 or 2 doses of history of polio since his last visit. He tolerates it well. He is enjoying his job. He is looking forward to a 4 day break over Christmas. He is ready received his influenza vaccination.  Review of Systems: Review of  Systems  Constitutional: Negative for chills, diaphoresis, fever, malaise/fatigue and weight loss.  HENT: Negative for sore throat.   Respiratory: Negative for cough, sputum production and shortness of breath.   Cardiovascular: Negative for chest pain.  Gastrointestinal: Negative for abdominal pain, diarrhea, heartburn, nausea and vomiting.  Genitourinary: Negative for dysuria and frequency.  Musculoskeletal: Negative for joint pain and myalgias.  Skin: Negative for rash.  Neurological: Negative for dizziness and headaches.  Psychiatric/Behavioral: Negative for depression and substance abuse. The patient is not nervous/anxious.     Past Medical History:  Diagnosis Date  . Allergy   . Anxiety   . Belching   . Cancer [facility](HCC)    pt denies any dx of cancer  . Cholecystitis chronic   . Depression   . Diabetes  mellitus   . Diarrhea   . GERD (gastroesophageal reflux disease)   . Hepatitis    hepatitis B  . HIV (human immunodeficiency virus infection) (HCC)   . Hypercholesterolemia   . Neuromuscular disorder (HCC)    neuropathy  . Neuropathy (HCC)   . Pneumonia    childhood  . Seizures (HCC)    as a child, none since age 89    Social History  Substance Use Topics  . Smoking status: Former Smoker    Packs/day: 0.80    Years: 20.00    Types: Cigarettes    Quit date: 05/06/2006  . Smokeless tobacco: Never Used  . Alcohol use 0.5 oz/week    1 Standard drinks or equivalent per week     Comment: 1-2 drinks every week    Family History  Problem Relation Age of Onset  . Cancer Mother     breast  . Cancer Maternal Grandmother     melanoma-nose  . Cancer Paternal Grandfather     melanoma-ear  . Colon cancer Neg Hx   . Esophageal cancer Neg Hx   . Rectal cancer Neg Hx   . Stomach cancer Neg Hx     Allergies  Allergen Reactions  . Phenytoin Other (See Comments)    Childhood pt unsure  . Metformin And Related Nausea And Vomiting    Objective:  Vitals:   10/26/16 0947  BP: 106/74  Pulse: 88  Temp: 97.7 F (36.5 C)  TempSrc: Oral  Weight: 154 lb (69.9 kg)   Body mass index is 22.1 kg/m.  Physical Exam  Constitutional: He is oriented to person, place, and time.  HENT:  Mouth/Throat: No oropharyngeal exudate.  Eyes: Conjunctivae are normal.  Cardiovascular: Normal rate and regular rhythm.   No murmur heard. Pulmonary/Chest: Effort normal and breath sounds normal.  Abdominal: Soft. He exhibits no mass. There is no tenderness.  Musculoskeletal: Normal range of motion.  Neurological: He is alert and oriented to person, place, and time.  Skin: No rash noted.  Psychiatric: Mood and affect normal.    Lab Results Lab Results  Component Value Date   WBC 7.8 10/12/2016   HGB 16.1 10/12/2016   HCT 48.3 10/12/2016   MCV 92.4 10/12/2016   PLT 306 10/12/2016    Lab  Results  Component Value Date   CREATININE 1.25 10/12/2016   BUN 15 10/12/2016   NA 138 10/12/2016   K 4.5 10/12/2016   CL 105 10/12/2016   CO2 25 10/12/2016    Lab Results  Component Value Date   ALT 32 10/12/2016   AST 26 10/12/2016   ALKPHOS 85 10/12/2016   BILITOT 0.6 10/12/2016    Lab Results  Component Value Date   CHOL 152 10/12/2016   HDL 48 10/12/2016   LDLCALC 78 10/12/2016   LDLDIRECT 104.0 08/17/2016   TRIG 129 10/12/2016   CHOLHDL 3.2 10/12/2016   HIV 1 RNA Quant (copies/mL)  Date Value  10/12/2016 <20  04/08/2016 53 (H)  10/09/2015 <20   CD4 T Cell Abs (/uL)  Date Value  10/12/2016 1,080  04/08/2016 1,120  10/09/2015 1,290     Problem List Items Addressed This Visit      High   Human immunodeficiency virus (HIV) disease (HCC)    His infection remains under excellent, long-term control. He will continue Genvoya in follow-up after lab work in one year.      Relevant Orders   CBC   T-helper cell (CD4)- ([facility] clinic only)   Comprehensive metabolic panel   Lipid panel   RPR   HIV 1 RNA quant-no reflex-bld        Cliffton AstersJohn Pheonix Wisby, MD [facility] [phone] pager   [phone] cell 10/26/2016, 9:57 AM

## 2016-12-04 LAB — HM DIABETES EYE EXAM

## 2016-12-10 ENCOUNTER — Telehealth: Payer: Self-pay | Admitting: General Practice

## 2016-12-10 ENCOUNTER — Other Ambulatory Visit: Payer: Self-pay | Admitting: General Practice

## 2016-12-10 MED ORDER — CANAGLIFLOZIN 100 MG PO TABS: 100.0000 mg | ORAL_TABLET | Freq: Every day | ORAL | 1 refills | Status: DC

## 2016-12-10 NOTE — Telephone Encounter (Signed)
Medication filled to pharmacy as requested.   

## 2016-12-10 NOTE — Telephone Encounter (Signed)
Received a PA from pt pharmacy today in regards to jardiance denial. Please advise they want him to try and fail invokana. Is this a one to one trade off?

## 2016-12-10 NOTE — Telephone Encounter (Signed)
invokana 100mg  daily

## 2016-12-27 ENCOUNTER — Encounter: Payer: Self-pay | Admitting: Family Medicine

## 2016-12-27 MED ORDER — AMOXICILLIN 875 MG PO TABS: 875.0000 mg | ORAL_TABLET | Freq: Two times a day (BID) | ORAL | 0 refills | Status: DC

## 2017-01-01 ENCOUNTER — Other Ambulatory Visit: Payer: Self-pay | Admitting: Family Medicine

## 2017-01-04 ENCOUNTER — Encounter: Payer: Self-pay | Admitting: Family Medicine

## 2017-01-04 ENCOUNTER — Ambulatory Visit (INDEPENDENT_AMBULATORY_CARE_PROVIDER_SITE_OTHER): Payer: BLUE CROSS/BLUE SHIELD | Admitting: Family Medicine

## 2017-01-04 VITALS — BP 121/81 | HR 69 | Temp 98.1°F | Resp 16 | Ht 70.0 in | Wt 150.5 lb

## 2017-01-04 DIAGNOSIS — Z0000 Encounter for general adult medical examination without abnormal findings: Secondary | ICD-10-CM

## 2017-01-04 DIAGNOSIS — E119 Type 2 diabetes mellitus without complications: Secondary | ICD-10-CM

## 2017-01-04 LAB — CBC WITH DIFFERENTIAL/PLATELET
Basophils Absolute: 0 10*3/uL (ref 0.0–0.1)
Basophils Relative: 0.5 % (ref 0.0–3.0)
EOS PCT: 2.6 % (ref 0.0–5.0)
Eosinophils Absolute: 0.2 10*3/uL (ref 0.0–0.7)
HCT: 47.7 % (ref 39.0–52.0)
Hemoglobin: 16.1 g/dL (ref 13.0–17.0)
LYMPHS ABS: 2.8 10*3/uL (ref 0.7–4.0)
Lymphocytes Relative: 48.1 % — ABNORMAL HIGH (ref 12.0–46.0)
MCHC: 33.8 g/dL (ref 30.0–36.0)
MCV: 91 fl (ref 78.0–100.0)
MONOS PCT: 8.9 % (ref 3.0–12.0)
Monocytes Absolute: 0.5 10*3/uL (ref 0.1–1.0)
NEUTROS PCT: 39.9 % — AB (ref 43.0–77.0)
Neutro Abs: 2.4 10*3/uL (ref 1.4–7.7)
Platelets: 332 10*3/uL (ref 150.0–400.0)
RBC: 5.25 Mil/uL (ref 4.22–5.81)
RDW: 12.8 % (ref 11.5–15.5)
WBC: 5.9 10*3/uL (ref 4.0–10.5)

## 2017-01-04 LAB — HEPATIC FUNCTION PANEL
ALK PHOS: 68 U/L (ref 39–117)
ALT: 35 U/L (ref 0–53)
AST: 29 U/L (ref 0–37)
Albumin: 4.6 g/dL (ref 3.5–5.2)
BILIRUBIN DIRECT: 0.1 mg/dL (ref 0.0–0.3)
Total Bilirubin: 0.6 mg/dL (ref 0.2–1.2)
Total Protein: 6.9 g/dL (ref 6.0–8.3)

## 2017-01-04 LAB — LIPID PANEL
CHOL/HDL RATIO: 3
Cholesterol: 157 mg/dL (ref 0–200)
HDL: 45.5 mg/dL (ref >39.00)
LDL CALC: 84 mg/dL (ref 0–99)
NonHDL: 111.44
Triglycerides: 138 mg/dL (ref 0.0–149.0)
VLDL: 27.6 mg/dL (ref 0.0–40.0)

## 2017-01-04 LAB — BASIC METABOLIC PANEL
BUN: 13 mg/dL (ref 6–23)
CHLORIDE: 102 meq/L (ref 96–112)
CO2: 28 mEq/L (ref 19–32)
Calcium: 9.3 mg/dL (ref 8.4–10.5)
Creatinine, Ser: 1.17 mg/dL (ref 0.40–1.50)
GFR: 71.05 mL/min (ref >60.00)
Glucose, Bld: 125 mg/dL — ABNORMAL HIGH (ref 70–99)
POTASSIUM: 4.2 meq/L (ref 3.5–5.1)
SODIUM: 139 meq/L (ref 135–145)

## 2017-01-04 LAB — HEMOGLOBIN A1C: Hgb A1c MFr Bld: 7.6 % — ABNORMAL HIGH (ref 4.6–6.5)

## 2017-01-04 LAB — PSA: PSA: 0.82 ng/mL (ref 0.10–4.00)

## 2017-01-04 LAB — TSH: TSH: 1.77 u[IU]/mL (ref 0.35–4.50)

## 2017-01-04 NOTE — Progress Notes (Signed)
Pre visit review using our clinic review tool, if applicable. No additional management support is needed unless otherwise documented below in the visit note. 

## 2017-01-04 NOTE — Progress Notes (Signed)
   Subjective:    Patient ID: Frederick MillinChristopher R Cullens, male    DOB: 02/09/1971, 46 y.o.   MRN: [id number]  HPI CPE- UTD on flu and Tdap.  UTD on eye exam.  Home CBGs 'have been good'.     Review of Systems Patient reports no vision/hearing changes, anorexia, fever ,adenopathy, persistant/recurrent hoarseness, swallowing issues, chest pain, palpitations, edema, persistant/recurrent cough, hemoptysis, dyspnea (rest,exertional, paroxysmal nocturnal), gastrointestinal  bleeding (melena, rectal bleeding), abdominal pain, excessive heart burn, GU symptoms (dysuria, hematuria, voiding/incontinence issues) syncope, focal weakness, memory loss, numbness & tingling, skin/hair/nail changes, depression, anxiety, abnormal bruising/bleeding, musculoskeletal symptoms/signs.     Objective:   Physical Exam General Appearance:    Alert, cooperative, no distress, appears stated age  Head:    Normocephalic, without obvious abnormality, atraumatic  Eyes:    PERRL, conjunctiva/corneas clear, EOM's intact, fundi    benign, both eyes       Ears:    Normal TM's and external ear canals, both ears  Nose:   Nares normal, septum midline, mucosa normal, no drainage   or sinus tenderness  Throat:   Lips, mucosa, and tongue normal; teeth and gums normal  Neck:   Supple, symmetrical, trachea midline, no adenopathy;       thyroid:  No enlargement/tenderness/nodules  Back:     Symmetric, no curvature, ROM normal, no CVA tenderness  Lungs:     Clear to auscultation bilaterally, respirations unlabored  Chest wall:    No tenderness or deformity  Heart:    Regular rate and rhythm, S1 and S2 normal, no murmur, rub   or gallop  Abdomen:     Soft, non-tender, bowel sounds active all four quadrants,    no masses, no organomegaly  Genitalia:    Normal male without lesion, masses,discharge or tenderness  Rectal:    Deferred due to young age  Extremities:   Extremities normal, atraumatic, no cyanosis or edema  Pulses:   2+ and  symmetric all extremities  Skin:   Skin color, texture, turgor normal, no rashes or lesions  Lymph nodes:   Cervical, supraclavicular, and axillary nodes normal  Neurologic:   CNII-XII intact. Normal strength, sensation and reflexes      Throughout     Diabetic Foot Exam - Simple   Simple Foot Form Diabetic Foot exam was performed with the following findings:  Yes 01/04/2017  9:23 AM  Visual Inspection No deformities, no ulcerations, no other skin breakdown bilaterally:  Yes Sensation Testing Intact to touch and monofilament testing bilaterally:  Yes Pulse Check Posterior Tibialis and Dorsalis pulse intact bilaterally:  Yes Comments         Assessment & Plan:

## 2017-01-04 NOTE — Patient Instructions (Signed)
Follow up in 3-4 months to recheck diabetes We'll notify you of your lab results and make any changes if needed Continue to work on healthy diet and regular exercise- you look great! Call with any questions or concerns Happy [place]!!!

## 2017-01-04 NOTE — Assessment & Plan Note (Signed)
Pt's PE WNL.  UTD on immunizations.  Check labs.  Anticipatory guidance provided.  

## 2017-01-04 NOTE — Assessment & Plan Note (Signed)
A1C has been progressively worsening since pt was released by Dr Sharl MaKerr.  Last labs showed poor sugar control and Invokana was added.  If labs are not better today- will refer back to Dr Sharl MaKerr.  UTD on eye exam, foot exam done today.  On ACE for renal protection.

## 2017-01-06 ENCOUNTER — Other Ambulatory Visit: Payer: Self-pay | Admitting: Family Medicine

## 2017-01-13 ENCOUNTER — Encounter: Payer: Self-pay | Admitting: Family Medicine

## 2017-01-13 MED ORDER — CANAGLIFLOZIN 100 MG PO TABS: 100.0000 mg | ORAL_TABLET | Freq: Every day | ORAL | 1 refills | Status: DC

## 2017-01-16 ENCOUNTER — Encounter: Payer: Self-pay | Admitting: Family Medicine

## 2017-01-17 ENCOUNTER — Other Ambulatory Visit: Payer: Self-pay | Admitting: General Practice

## 2017-01-17 MED ORDER — CANAGLIFLOZIN 100 MG PO TABS: 100.0000 mg | ORAL_TABLET | Freq: Every day | ORAL | 1 refills | Status: DC

## 2017-02-19 ENCOUNTER — Other Ambulatory Visit: Payer: Self-pay | Admitting: Family Medicine

## 2017-04-02 ENCOUNTER — Other Ambulatory Visit: Payer: Self-pay | Admitting: Family Medicine

## 2017-04-03 ENCOUNTER — Other Ambulatory Visit: Payer: Self-pay | Admitting: Family Medicine

## 2017-04-19 ENCOUNTER — Encounter: Payer: Self-pay | Admitting: Family Medicine

## 2017-04-19 ENCOUNTER — Ambulatory Visit (INDEPENDENT_AMBULATORY_CARE_PROVIDER_SITE_OTHER): Payer: BLUE CROSS/BLUE SHIELD | Admitting: Family Medicine

## 2017-04-19 VITALS — BP 110/78 | HR 89 | Temp 97.9°F | Resp 17 | Ht 70.0 in | Wt 151.5 lb

## 2017-04-19 DIAGNOSIS — N492 Inflammatory disorders of scrotum: Secondary | ICD-10-CM | POA: Diagnosis not present

## 2017-04-19 DIAGNOSIS — B354 Tinea corporis: Secondary | ICD-10-CM | POA: Diagnosis not present

## 2017-04-19 DIAGNOSIS — F419 Anxiety disorder, unspecified: Secondary | ICD-10-CM | POA: Diagnosis not present

## 2017-04-19 DIAGNOSIS — E119 Type 2 diabetes mellitus without complications: Secondary | ICD-10-CM

## 2017-04-19 LAB — HEMOGLOBIN A1C: HEMOGLOBIN A1C: 7.4 % — AB (ref 4.6–6.5)

## 2017-04-19 LAB — BASIC METABOLIC PANEL
BUN: 15 mg/dL (ref 6–23)
CALCIUM: 9.8 mg/dL (ref 8.4–10.5)
CO2: 29 mEq/L (ref 19–32)
Chloride: 102 mEq/L (ref 96–112)
Creatinine, Ser: 1.11 mg/dL (ref 0.40–1.50)
GFR: 75.41 mL/min (ref >60.00)
Glucose, Bld: 183 mg/dL — ABNORMAL HIGH (ref 70–99)
POTASSIUM: 4.2 meq/L (ref 3.5–5.1)
SODIUM: 137 meq/L (ref 135–145)

## 2017-04-19 MED ORDER — BUPROPION HCL ER (XL) 300 MG PO TB24: 300.0000 mg | ORAL_TABLET | Freq: Every day | ORAL | 3 refills | Status: DC

## 2017-04-19 NOTE — Progress Notes (Signed)
   Subjective:    Patient ID: Frederick MillinChristopher R Smithey, male    DOB: 02/16/1971, 46 y.o.   MRN: [id number]  HPI DM- chronic problem.  On Invokana 100mg  and Victoza 1.8mg  daily.  UTD on foot exam, eye exam.  On ACE for renal protection.  Pt reports home CBGs are 'mostly around 120', 'nothing over 165'.  Denies symptomatic lows.  Denies CP, SOB, HAs, visual changes, edema.  Some new numbness of R great toe.  Anxiety- pt reports worsening sxs.  Not interested in restarting Benzo but open to changing medication to improve sxs (currently on Wellbutrin 150mg  daily)  Bump on scrotum- new.  Mildly TTP, some overlying redness.  Review of Systems For ROS see HPI     Objective:   Physical Exam  Constitutional: He is oriented to person, place, and time. He appears well-developed and well-nourished. No distress.  HENT:  Head: Normocephalic and atraumatic.  Eyes: Conjunctivae and EOM are normal. Pupils are equal, round, and reactive to light.  Neck: Normal range of motion. Neck supple. No thyromegaly present.  Cardiovascular: Normal rate, regular rhythm, normal heart sounds and intact distal pulses.   No murmur heard. Pulmonary/Chest: Effort normal and breath sounds normal. No respiratory distress.  Abdominal: Soft. Bowel sounds are normal. He exhibits no distension.  Genitourinary: Penis normal.  Genitourinary Comments: Small pimple on L side of scrotum w/o overlying redness, fluctuance or visible pus  Musculoskeletal: He exhibits no edema.  Lymphadenopathy:    He has no cervical adenopathy.  Neurological: He is alert and oriented to person, place, and time. No cranial nerve deficit.  Skin: Skin is warm and dry.  2 small, well circumscribed lesions on abd wall consistent w/ ringworm  Psychiatric: He has a normal mood and affect. His behavior is normal.  Vitals reviewed.         Assessment & Plan:  Ringworm- new.  Pt's rash on abdominal wall is consistent w/ ringworm.  Start topical  Clotrimazole.  Pt expressed understanding and is in agreement w/ plan.   Scrotal pimple- new.  Reassurance provided that this is a skin lesion and not a deeper bump.  No further work up needed.  Reviewed supportive care and red flags that should prompt return.  Pt expressed understanding and is in agreement w/ plan.

## 2017-04-19 NOTE — Assessment & Plan Note (Signed)
Chronic problem.  Pt reports CBGs are much better on the current Victoza and Invokana regimen.  UTD on foot exam, eye exam.  On ACE for renal protection.  Check labs.  Adjust meds prn

## 2017-04-19 NOTE — Progress Notes (Signed)
Pre visit review using our clinic review tool, if applicable. No additional management support is needed unless otherwise documented below in the visit note. 

## 2017-04-19 NOTE — Assessment & Plan Note (Signed)
Deteriorated.  Pt is not interested in restarting Benzos at this time.  Will increase Wellbutrin to 300mg  daily and monitor for improvement.

## 2017-04-19 NOTE — Patient Instructions (Addendum)
Follow up in 3-4 months to recheck diabetes and cholesterol We'll notify you of your lab results and make any changes if needed Continue to work on low carb diet- you look great!! Use OTC Clotrimazole cream (usually in [facility] aisle) twice daily on the areas on your stomach Increase the Wellbutrin to 300mg - 2 of what you have at home and 1 of the new prescription Call with any questions or concerns Have a great summer!!!

## 2017-07-01 ENCOUNTER — Other Ambulatory Visit: Payer: Self-pay | Admitting: Family Medicine

## 2017-07-04 ENCOUNTER — Other Ambulatory Visit: Payer: Self-pay | Admitting: Internal Medicine

## 2017-07-04 DIAGNOSIS — B20 Human immunodeficiency virus [HIV] disease: Secondary | ICD-10-CM

## 2017-07-06 ENCOUNTER — Other Ambulatory Visit: Payer: Self-pay | Admitting: Family Medicine

## 2017-07-11 ENCOUNTER — Encounter: Payer: Self-pay | Admitting: Family Medicine

## 2017-07-12 MED ORDER — BUPROPION HCL ER (XL) 300 MG PO TB24: 300.0000 mg | ORAL_TABLET | Freq: Every day | ORAL | 0 refills | Status: DC

## 2017-07-12 MED ORDER — TADALAFIL 20 MG PO TABS: ORAL_TABLET | ORAL | 1 refills | Status: DC

## 2017-07-12 MED ORDER — FENOFIBRATE 160 MG PO TABS: ORAL_TABLET | ORAL | 1 refills | Status: DC

## 2017-08-06 ENCOUNTER — Other Ambulatory Visit: Payer: Self-pay | Admitting: Family Medicine

## 2017-08-09 ENCOUNTER — Ambulatory Visit: Payer: BLUE CROSS/BLUE SHIELD | Admitting: Family Medicine

## 2017-08-14 ENCOUNTER — Other Ambulatory Visit: Payer: Self-pay | Admitting: Family Medicine

## 2017-09-28 ENCOUNTER — Encounter: Payer: Self-pay | Admitting: *Deleted

## 2017-09-28 ENCOUNTER — Other Ambulatory Visit: Payer: Self-pay | Admitting: Family Medicine

## 2017-09-28 ENCOUNTER — Other Ambulatory Visit: Payer: Self-pay | Admitting: Internal Medicine

## 2017-09-28 DIAGNOSIS — B20 Human immunodeficiency virus [HIV] disease: Secondary | ICD-10-CM

## 2017-10-09 ENCOUNTER — Other Ambulatory Visit: Payer: Self-pay | Admitting: Family Medicine

## 2017-10-20 ENCOUNTER — Ambulatory Visit (INDEPENDENT_AMBULATORY_CARE_PROVIDER_SITE_OTHER): Payer: BLUE CROSS/BLUE SHIELD | Admitting: Internal Medicine

## 2017-10-20 ENCOUNTER — Encounter: Payer: Self-pay | Admitting: Internal Medicine

## 2017-10-20 DIAGNOSIS — B20 Human immunodeficiency virus [HIV] disease: Secondary | ICD-10-CM

## 2017-10-20 DIAGNOSIS — R112 Nausea with vomiting, unspecified: Secondary | ICD-10-CM | POA: Insufficient documentation

## 2017-10-20 DIAGNOSIS — F419 Anxiety disorder, unspecified: Secondary | ICD-10-CM

## 2017-10-20 NOTE — Progress Notes (Signed)
Patient Active Problem List   Diagnosis Date Noted  . DM II (diabetes mellitus, type II), controlled (HCC) 12/11/2009    Priority: High  . Hyperlipidemia 08/20/2008    Priority: High  . Human immunodeficiency virus (HIV) disease (HCC) 06/01/2007    Priority: High  . Nausea and vomiting 10/20/2017  . Rectal bleeding 12/27/2014  . Nausea with vomiting 12/16/2014  . Cough 12/16/2014  . Elevated LFTs 12/16/2014  . Left wrist pain 09/16/2014  . Wart 08/15/2014  . Eustachian tube dysfunction 08/09/2013  . UTI (urinary tract infection) 08/09/2013  . Routine general medical examination at a health care facility 02/07/2013  . Dyshidrotic eczema 02/07/2013  . Chronic cholecystitis with calculus 05/09/2012  . Heel pain 09/23/2011  . DECREASED HEARING, BILATERAL 09/18/2009  . LOW BACK PAIN, CHRONIC 10/17/2008  . VENEREAL WART 08/20/2008  . GERD 06/05/2007  . Anxiety state 06/01/2007  . Anxiety 06/01/2007  . ALLERGIC RHINITIS 06/01/2007  . CARBUNCLE/FURUNCLE NOS 06/01/2007  . PSORIASIS NEC 06/01/2007  . NECROSIS, ASEPTIC, FEMUR HEAD/NECK 06/01/2007  . SEIZURE DISORDER 06/01/2007  . HEPATITIS B, HX OF 06/01/2007      Medication List        Accurate as of 10/20/17 10:47 AM. Always use your most recent med list.          aspirin 81 MG tablet   atorvastatin 10 MG tablet Commonly known as:  LIPITOR TAKE 1 TABLET BY MOUTH NIGHTLY   BD PEN NEEDLE NANO U/F 32G X 4 MM Misc Generic drug:  Insulin Pen Needle USE 1 NEEDLE TO INJECT 24 UNITS INTO THE SKIN DAILY.   buPROPion 300 MG 24 hr tablet Commonly known as:  WELLBUTRIN XL TAKE 1 TABLET BY MOUTH EVERY DAY   fenofibrate 160 MG tablet TAKE 1 TABLET (160 MG TOTAL) BY MOUTH DAILY.   fluticasone 50 MCG/ACT nasal spray Commonly known as:  FLONASE   GENVOYA 150-150-200-10 MG Tabs tablet Generic drug:  elvitegravir-cobicistat-emtricitabine-tenofovir TAKE ONE TABLET BY MOUTH ONCE DAILY WITH BREAKFAST. STORE IN  ORIGINALCONTAINER AT ROOM TEMPERATURE.   glucose blood test strip   INVOKANA 100 MG Tabs tablet Generic drug:  canagliflozin TAKE 1 TABLET (100 MG TOTAL) BY MOUTH DAILY BEFORE BREAKFAST.   lisinopril 5 MG tablet Commonly known as:  PRINIVIL,ZESTRIL TAKE 1 TABLET (5 MG TOTAL) BY MOUTH DAILY.   loratadine 10 MG tablet Commonly known as:  CLARITIN   magnesium oxide 400 MG tablet Commonly known as:  MAG-OX   ONETOUCH DELICA LANCETS 33G Misc   pantoprazole 40 MG tablet Commonly known as:  PROTONIX TAKE 1 TABLET (40 MG TOTAL) BY MOUTH 2 (TWO) TIMES DAILY.   PROBIOTIC DAILY PO   tadalafil 20 MG tablet Commonly known as:  CIALIS Take 1/2 to 1 tablet as needed.   VICTOZA 18 MG/3ML Sopn Generic drug:  liraglutide INJECT 1.8 MG (0.3ML) INTO THE SKIN DAILY       Subjective: Frederick Abbott is in for his routine HIV follow-up visit. He has had no problems obtaining or tolerating his Genvoya. He takes it each evening. He thinks he has missed 2-3 doses in the past year when he fell asleep before taking it. He remains on medication for his acid reflux and seasonal allergies. He has been bothered by occasional nausea and vomiting. He estimates that this happens 1-2 times each month. He does not know of anything that precipitates the nausea. He is not having acid reflux symptoms. He does not  have any early satiety. He is not feeling anxious.  Review of Systems: Review of Systems  Constitutional: Negative for chills, diaphoresis, fever, malaise/fatigue and weight loss.  HENT: Negative for sore throat.   Respiratory: Negative for cough, sputum production and shortness of breath.   Cardiovascular: Negative for chest pain.  Gastrointestinal: Positive for nausea and vomiting. Negative for abdominal pain, diarrhea and heartburn.  Genitourinary: Negative for dysuria and frequency.  Musculoskeletal: Negative for joint pain and myalgias.  Skin: Negative for rash.  Neurological: Negative for dizziness  and headaches.  Psychiatric/Behavioral: Negative for depression and substance abuse. The patient is not nervous/anxious.     Past Medical History:  Diagnosis Date  . Allergy   . Anxiety   . Belching   . Cancer [facility](HCC)    pt denies any dx of cancer  . Cholecystitis chronic   . Depression   . Diabetes mellitus   . Diarrhea   . GERD (gastroesophageal reflux disease)   . Hepatitis    hepatitis B  . HIV (human immunodeficiency virus infection) (HCC)   . Hypercholesterolemia   . Neuromuscular disorder (HCC)    neuropathy  . Neuropathy   . Pneumonia    childhood  . Seizures (HCC)    as a child, none since age 89    Social History   Tobacco Use  . Smoking status: Former Smoker    Packs/day: 0.80    Years: 20.00    Pack years: 16.00    Types: Cigarettes    Last attempt to quit: 05/06/2006    Years since quitting: 11.4  . Smokeless tobacco: Never Used  Substance Use Topics  . Alcohol use: Yes    Alcohol/week: 0.5 oz    Types: 1 Standard drinks or equivalent per week    Comment: 1-2 drinks every week  . Drug use: Yes    Types: Marijuana    Comment: last smoked marijuana 4 wekks ago per pt    Family History  Problem Relation Age of Onset  . Cancer Mother        breast  . Cancer Maternal Grandmother        melanoma-nose  . Cancer Paternal Grandfather        melanoma-ear  . Colon cancer Neg Hx   . Esophageal cancer Neg Hx   . Rectal cancer Neg Hx   . Stomach cancer Neg Hx     Allergies  Allergen Reactions  . Phenytoin Other (See Comments)    Childhood pt unsure  . Metformin And Related Nausea And Vomiting    Health Maintenance  Topic Date Due  . INFLUENZA VACCINE  06/08/2017  . HEMOGLOBIN A1C  10/19/2017  . OPHTHALMOLOGY EXAM  12/04/2017  . FOOT EXAM  01/04/2018  . TETANUS/TDAP  08/10/2023  . PNEUMOCOCCAL POLYSACCHARIDE VACCINE  Completed  . HIV Screening  Completed    Objective:  Vitals:   10/20/17 1012  BP: 119/77  Pulse: 82  Weight: 155 lb  (70.3 kg)  Height: 5\' 10"  (1.778 m)   Body mass index is 22.24 kg/m.  Physical Exam  Constitutional: He is oriented to person, place, and time.  He is in good spirits.  HENT:  Mouth/Throat: No oropharyngeal exudate.  Eyes: Conjunctivae are normal.  Cardiovascular: Normal rate and regular rhythm.  No murmur heard. Pulmonary/Chest: Effort normal and breath sounds normal. He has no wheezes. He has no rales.  Abdominal: Soft. He exhibits no mass. There is no tenderness.  Musculoskeletal: Normal range of  motion.  Neurological: He is alert and oriented to person, place, and time.  Skin: No rash noted.  Psychiatric: Mood and affect normal.    Lab Results Lab Results  Component Value Date   WBC 5.9 01/04/2017   HGB 16.1 01/04/2017   HCT 47.7 01/04/2017   MCV 91.0 01/04/2017   PLT 332.0 01/04/2017    Lab Results  Component Value Date   CREATININE 1.11 04/19/2017   BUN 15 04/19/2017   NA 137 04/19/2017   K 4.2 04/19/2017   CL 102 04/19/2017   CO2 29 04/19/2017    Lab Results  Component Value Date   ALT 35 01/04/2017   AST 29 01/04/2017   ALKPHOS 68 01/04/2017   BILITOT 0.6 01/04/2017    Lab Results  Component Value Date   CHOL 157 01/04/2017   HDL 45.50 01/04/2017   LDLCALC 84 01/04/2017   LDLDIRECT 104.0 08/17/2016   TRIG 138.0 01/04/2017   CHOLHDL 3 01/04/2017   Lab Results  Component Value Date   LABRPR NON REAC 10/12/2016   HIV 1 RNA Quant (copies/mL)  Date Value  10/12/2016 <20  04/08/2016 53 (H)  10/09/2015 <20   CD4 T Cell Abs (/uL)  Date Value  10/12/2016 1,080  04/08/2016 1,120  10/09/2015 1,290     Problem List Items Addressed This Visit      High   Human immunodeficiency virus (HIV) disease (HCC)    His adherence is very good, overall. I told him that if he notes continued missed doses at night to change and take his Genvoya in the morning. He will have lab work today and follow-up in one year.      Relevant Orders   T-helper cell  (CD4)- ([facility] clinic only)   HIV 1 RNA quant-no reflex-bld   HIV-1 genotypr plus     Unprioritized   Anxiety    His anxiety and depression are in remission.      Nausea and vomiting    I am not sure what is causing his nausea and vomiting. It is possible it could be related to his known acid reflux. He does not have other symptoms to suggest gastroparesis or anxiety.I suggested that he keep a food diary for the next 1-2 months if possible.           Cliffton Asters, MD [facility] [phone] pager   [phone] cell 10/20/2017, 10:47 AM

## 2017-10-20 NOTE — Assessment & Plan Note (Signed)
His adherence is very good, overall. I told him that if he notes continued missed doses at night to change and take his Genvoya in the morning. He will have lab work today and follow-up in one year.

## 2017-10-20 NOTE — Assessment & Plan Note (Signed)
I am not sure what is causing his nausea and vomiting. It is possible it could be related to his known acid reflux. He does not have other symptoms to suggest gastroparesis or anxiety.I suggested that he keep a food diary for the next 1-2 months if possible.

## 2017-10-20 NOTE — Assessment & Plan Note (Signed)
His anxiety and depression are in remission. 

## 2017-10-21 LAB — T-HELPER CELL (CD4) - (RCID CLINIC ONLY)
CD4 T CELL ABS: 1100 /uL (ref 400–2700)
CD4 T CELL HELPER: 31 % — AB (ref 33–55)

## 2017-11-03 LAB — HIV-1 GENOTYPE: HIV-1 GENOTYPE: NOT DETECTED

## 2017-11-03 LAB — HIV-1 RNA QUANT-NO REFLEX-BLD
HIV 1 RNA QUANT: 32 {copies}/mL — AB
HIV-1 RNA QUANT, LOG: 1.51 {Log_copies}/mL — AB

## 2017-11-10 ENCOUNTER — Other Ambulatory Visit: Payer: Self-pay | Admitting: Family Medicine

## 2017-12-14 ENCOUNTER — Observation Stay (HOSPITAL_COMMUNITY)
Admission: EM | Admit: 2017-12-14 | Discharge: 2017-12-15 | Disposition: A | Discharge status: Home / Self Care | Payer: BLUE CROSS/BLUE SHIELD | Attending: Surgery | Admitting: Surgery

## 2017-12-14 ENCOUNTER — Other Ambulatory Visit: Payer: Self-pay

## 2017-12-14 ENCOUNTER — Encounter (HOSPITAL_COMMUNITY): Payer: Self-pay

## 2017-12-14 ENCOUNTER — Emergency Department (HOSPITAL_COMMUNITY): Payer: BLUE CROSS/BLUE SHIELD

## 2017-12-14 ENCOUNTER — Encounter (HOSPITAL_COMMUNITY)
Admission: EM | Disposition: A | Discharge status: Home / Self Care | Payer: Self-pay | Source: Home / Self Care | Attending: Emergency Medicine

## 2017-12-14 ENCOUNTER — Emergency Department (HOSPITAL_COMMUNITY): Payer: BLUE CROSS/BLUE SHIELD | Admitting: Certified Registered Nurse Anesthetist

## 2017-12-14 ENCOUNTER — Encounter: Payer: Self-pay | Admitting: Family Medicine

## 2017-12-14 DIAGNOSIS — Z96643 Presence of artificial hip joint, bilateral: Secondary | ICD-10-CM | POA: Insufficient documentation

## 2017-12-14 DIAGNOSIS — E7800 Pure hypercholesterolemia, unspecified: Secondary | ICD-10-CM | POA: Diagnosis not present

## 2017-12-14 DIAGNOSIS — E119 Type 2 diabetes mellitus without complications: Secondary | ICD-10-CM | POA: Insufficient documentation

## 2017-12-14 DIAGNOSIS — K219 Gastro-esophageal reflux disease without esophagitis: Secondary | ICD-10-CM | POA: Insufficient documentation

## 2017-12-14 DIAGNOSIS — F419 Anxiety disorder, unspecified: Secondary | ICD-10-CM | POA: Insufficient documentation

## 2017-12-14 DIAGNOSIS — Z7982 Long term (current) use of aspirin: Secondary | ICD-10-CM | POA: Diagnosis not present

## 2017-12-14 DIAGNOSIS — G629 Polyneuropathy, unspecified: Secondary | ICD-10-CM | POA: Diagnosis not present

## 2017-12-14 DIAGNOSIS — Z7984 Long term (current) use of oral hypoglycemic drugs: Secondary | ICD-10-CM | POA: Diagnosis not present

## 2017-12-14 DIAGNOSIS — Z21 Asymptomatic human immunodeficiency virus [HIV] infection status: Secondary | ICD-10-CM | POA: Insufficient documentation

## 2017-12-14 DIAGNOSIS — Z7951 Long term (current) use of inhaled steroids: Secondary | ICD-10-CM | POA: Diagnosis not present

## 2017-12-14 DIAGNOSIS — Z79899 Other long term (current) drug therapy: Secondary | ICD-10-CM | POA: Insufficient documentation

## 2017-12-14 DIAGNOSIS — K3580 Unspecified acute appendicitis: Secondary | ICD-10-CM | POA: Diagnosis present

## 2017-12-14 DIAGNOSIS — F329 Major depressive disorder, single episode, unspecified: Secondary | ICD-10-CM | POA: Insufficient documentation

## 2017-12-14 DIAGNOSIS — K3533 Acute appendicitis with perforation and localized peritonitis, with abscess: Principal | ICD-10-CM | POA: Insufficient documentation

## 2017-12-14 DIAGNOSIS — Z87891 Personal history of nicotine dependence: Secondary | ICD-10-CM | POA: Insufficient documentation

## 2017-12-14 HISTORY — DX: Unspecified viral hepatitis B without hepatic coma: B19.10

## 2017-12-14 HISTORY — PX: APPENDECTOMY: SHX54

## 2017-12-14 HISTORY — PX: LAPAROSCOPIC APPENDECTOMY: SHX408

## 2017-12-14 HISTORY — DX: Other seasonal allergic rhinitis: J30.2

## 2017-12-14 HISTORY — DX: Type 2 diabetes mellitus without complications: E11.9

## 2017-12-14 HISTORY — DX: Hepatitis a without hepatic coma: B15.9

## 2017-12-14 LAB — COMPREHENSIVE METABOLIC PANEL
ALBUMIN: 4.2 g/dL (ref 3.5–5.0)
ALT: 34 U/L (ref 17–63)
ANION GAP: 12 (ref 5–15)
AST: 35 U/L (ref 15–41)
Alkaline Phosphatase: 60 U/L (ref 38–126)
BUN: 10 mg/dL (ref 6–20)
CHLORIDE: 102 mmol/L (ref 101–111)
CO2: 21 mmol/L — ABNORMAL LOW (ref 22–32)
Calcium: 9.1 mg/dL (ref 8.9–10.3)
Creatinine, Ser: 1.23 mg/dL (ref 0.61–1.24)
GFR calc Af Amer: >60 mL/min (ref >60)
GFR calc non Af Amer: >60 mL/min (ref >60)
GLUCOSE: 199 mg/dL — AB (ref 65–99)
POTASSIUM: 3.6 mmol/L (ref 3.5–5.1)
SODIUM: 135 mmol/L (ref 135–145)
Total Bilirubin: 1.3 mg/dL — ABNORMAL HIGH (ref 0.3–1.2)
Total Protein: 6.8 g/dL (ref 6.5–8.1)

## 2017-12-14 LAB — CBC
HEMATOCRIT: 46.2 % (ref 39.0–52.0)
HEMOGLOBIN: 16.1 g/dL (ref 13.0–17.0)
MCH: 31.4 pg (ref 26.0–34.0)
MCHC: 34.8 g/dL (ref 30.0–36.0)
MCV: 90.2 fL (ref 78.0–100.0)
Platelets: 279 10*3/uL (ref 150–400)
RBC: 5.12 MIL/uL (ref 4.22–5.81)
RDW: 12.9 % (ref 11.5–15.5)
WBC: 11.6 10*3/uL — ABNORMAL HIGH (ref 4.0–10.5)

## 2017-12-14 LAB — URINALYSIS, ROUTINE W REFLEX MICROSCOPIC
BACTERIA UA: NONE SEEN
BILIRUBIN URINE: NEGATIVE
Glucose, UA: >=500 mg/dL — AB
HGB URINE DIPSTICK: NEGATIVE
Ketones, ur: NEGATIVE mg/dL
LEUKOCYTES UA: NEGATIVE
Nitrite: NEGATIVE
PROTEIN: NEGATIVE mg/dL
SPECIFIC GRAVITY, URINE: 1.035 — AB (ref 1.005–1.030)
SQUAMOUS EPITHELIAL / LPF: NONE SEEN
WBC UA: NONE SEEN WBC/hpf (ref 0–5)
pH: 5 (ref 5.0–8.0)

## 2017-12-14 LAB — I-STAT CG4 LACTIC ACID, ED
LACTIC ACID, VENOUS: 2.09 mmol/L — AB (ref 0.5–1.9)
Lactic Acid, Venous: 1.85 mmol/L (ref 0.5–1.9)

## 2017-12-14 LAB — LIPASE, BLOOD: LIPASE: 44 U/L (ref 11–51)

## 2017-12-14 LAB — GLUCOSE, CAPILLARY
Glucose-Capillary: 115 mg/dL — ABNORMAL HIGH (ref 65–99)
Glucose-Capillary: 143 mg/dL — ABNORMAL HIGH (ref 65–99)

## 2017-12-14 SURGERY — APPENDECTOMY, LAPAROSCOPIC
Anesthesia: General | Site: Abdomen

## 2017-12-14 MED ORDER — LIDOCAINE HCL (CARDIAC) 20 MG/ML IV SOLN
INTRAVENOUS | Status: DC | PRN
  Administered 2017-12-14: 80 mg via INTRAVENOUS

## 2017-12-14 MED ORDER — MIDAZOLAM HCL 5 MG/5ML IJ SOLN
INTRAMUSCULAR | Status: DC | PRN
  Administered 2017-12-14: 2 mg via INTRAVENOUS

## 2017-12-14 MED ORDER — HYDROMORPHONE HCL 1 MG/ML IJ SOLN
0.2500 mg | INTRAMUSCULAR | Status: DC | PRN
  Administered 2017-12-14: 0.5 mg via INTRAVENOUS

## 2017-12-14 MED ORDER — ELVITEG-COBIC-EMTRICIT-TENOFAF 150-150-200-10 MG PO TABS
1.0000 | ORAL_TABLET | Freq: Every day | ORAL | Status: DC
  Administered 2017-12-15: 1 via ORAL
  Filled 2017-12-14: qty 1

## 2017-12-14 MED ORDER — LACTATED RINGERS IV SOLN
INTRAVENOUS | Status: DC
  Administered 2017-12-14: 19:00:00 via INTRAVENOUS

## 2017-12-14 MED ORDER — ADULT MULTIVITAMIN W/MINERALS CH: 1.0000 | ORAL_TABLET | Freq: Every day | ORAL | Status: DC

## 2017-12-14 MED ORDER — SCOPOLAMINE 1 MG/3DAYS TD PT72
1.0000 | MEDICATED_PATCH | TRANSDERMAL | Status: DC
  Filled 2017-12-14: qty 1

## 2017-12-14 MED ORDER — MIDAZOLAM HCL 2 MG/2ML IJ SOLN
INTRAMUSCULAR | Status: AC
  Filled 2017-12-14: qty 2

## 2017-12-14 MED ORDER — HEPARIN SODIUM (PORCINE) 5000 UNIT/ML IJ SOLN
5000.0000 [IU] | Freq: Three times a day (TID) | INTRAMUSCULAR | Status: DC
  Administered 2017-12-15: 5000 [IU] via SUBCUTANEOUS
  Filled 2017-12-14: qty 1

## 2017-12-14 MED ORDER — ATORVASTATIN CALCIUM 10 MG PO TABS
10.0000 mg | ORAL_TABLET | Freq: Every day | ORAL | Status: DC
  Administered 2017-12-14: 10 mg via ORAL
  Filled 2017-12-14: qty 1

## 2017-12-14 MED ORDER — LACTATED RINGERS IV SOLN
INTRAVENOUS | Status: DC
  Administered 2017-12-14: 21:00:00 via INTRAVENOUS

## 2017-12-14 MED ORDER — PROMETHAZINE HCL 25 MG/ML IJ SOLN: 6.2500 mg | INTRAMUSCULAR | Status: DC | PRN

## 2017-12-14 MED ORDER — HYDRALAZINE HCL 20 MG/ML IJ SOLN: 10.0000 mg | INTRAMUSCULAR | Status: DC | PRN

## 2017-12-14 MED ORDER — SIMETHICONE 80 MG PO CHEW: 40.0000 mg | CHEWABLE_TABLET | Freq: Four times a day (QID) | ORAL | Status: DC | PRN

## 2017-12-14 MED ORDER — IBUPROFEN 600 MG PO TABS
600.0000 mg | ORAL_TABLET | Freq: Four times a day (QID) | ORAL | Status: DC
  Administered 2017-12-14 – 2017-12-15 (×2): 600 mg via ORAL
  Filled 2017-12-14 (×2): qty 1

## 2017-12-14 MED ORDER — TRAMADOL HCL 50 MG PO TABS
50.0000 mg | ORAL_TABLET | Freq: Four times a day (QID) | ORAL | Status: DC | PRN
  Administered 2017-12-15: 50 mg via ORAL
  Filled 2017-12-14: qty 1

## 2017-12-14 MED ORDER — 0.9 % SODIUM CHLORIDE (POUR BTL) OPTIME
TOPICAL | Status: DC | PRN
  Administered 2017-12-14: 1000 mL

## 2017-12-14 MED ORDER — LISINOPRIL 5 MG PO TABS: 5.0000 mg | ORAL_TABLET | Freq: Every day | ORAL | Status: DC

## 2017-12-14 MED ORDER — ONDANSETRON HCL 4 MG/2ML IJ SOLN
4.0000 mg | Freq: Once | INTRAMUSCULAR | Status: AC
  Administered 2017-12-14: 4 mg via INTRAVENOUS
  Filled 2017-12-14: qty 2

## 2017-12-14 MED ORDER — FENTANYL CITRATE (PF) 250 MCG/5ML IJ SOLN
INTRAMUSCULAR | Status: AC
  Filled 2017-12-14: qty 5

## 2017-12-14 MED ORDER — BUPIVACAINE-EPINEPHRINE (PF) 0.5% -1:200000 IJ SOLN
INTRAMUSCULAR | Status: AC
  Filled 2017-12-14: qty 30

## 2017-12-14 MED ORDER — INSULIN ASPART 100 UNIT/ML ~~LOC~~ SOLN: 0.0000 [IU] | Freq: Three times a day (TID) | SUBCUTANEOUS | Status: DC

## 2017-12-14 MED ORDER — LORATADINE 10 MG PO TABS: 10.0000 mg | ORAL_TABLET | Freq: Every day | ORAL | Status: DC

## 2017-12-14 MED ORDER — FENTANYL CITRATE (PF) 100 MCG/2ML IJ SOLN
INTRAMUSCULAR | Status: DC | PRN
  Administered 2017-12-14: 100 ug via INTRAVENOUS
  Administered 2017-12-14: 50 ug via INTRAVENOUS

## 2017-12-14 MED ORDER — DOCUSATE SODIUM 100 MG PO CAPS
200.0000 mg | ORAL_CAPSULE | Freq: Two times a day (BID) | ORAL | Status: DC
  Administered 2017-12-14: 200 mg via ORAL
  Filled 2017-12-14: qty 2

## 2017-12-14 MED ORDER — ONDANSETRON HCL 4 MG/2ML IJ SOLN
INTRAMUSCULAR | Status: DC | PRN
  Administered 2017-12-14: 4 mg via INTRAVENOUS

## 2017-12-14 MED ORDER — IOPAMIDOL (ISOVUE-300) INJECTION 61%
INTRAVENOUS | Status: AC
  Administered 2017-12-14: 100 mL
  Filled 2017-12-14: qty 100

## 2017-12-14 MED ORDER — LIRAGLUTIDE 18 MG/3ML ~~LOC~~ SOPN: 1.8000 mg | PEN_INJECTOR | Freq: Every day | SUBCUTANEOUS | Status: DC

## 2017-12-14 MED ORDER — HYDROMORPHONE HCL 1 MG/ML IJ SOLN
INTRAMUSCULAR | Status: AC
  Filled 2017-12-14: qty 1

## 2017-12-14 MED ORDER — SUCCINYLCHOLINE CHLORIDE 20 MG/ML IJ SOLN
INTRAMUSCULAR | Status: DC | PRN
  Administered 2017-12-14: 100 mg via INTRAVENOUS

## 2017-12-14 MED ORDER — ACETAMINOPHEN 500 MG PO TABS
1000.0000 mg | ORAL_TABLET | Freq: Four times a day (QID) | ORAL | Status: DC
  Administered 2017-12-14: 1000 mg via ORAL
  Filled 2017-12-14: qty 2

## 2017-12-14 MED ORDER — SODIUM CHLORIDE 0.9 % IV BOLUS (SEPSIS)
1000.0000 mL | Freq: Once | INTRAVENOUS | Status: AC
  Administered 2017-12-14: 1000 mL via INTRAVENOUS

## 2017-12-14 MED ORDER — HYDROMORPHONE HCL 1 MG/ML IJ SOLN
1.0000 mg | Freq: Once | INTRAMUSCULAR | Status: AC
  Administered 2017-12-14: 1 mg via INTRAVENOUS
  Filled 2017-12-14: qty 1

## 2017-12-14 MED ORDER — BUPROPION HCL ER (XL) 150 MG PO TB24: 300.0000 mg | ORAL_TABLET | Freq: Every day | ORAL | Status: DC

## 2017-12-14 MED ORDER — SODIUM CHLORIDE 0.9 % IR SOLN
Status: DC | PRN
  Administered 2017-12-14: 1000 mL

## 2017-12-14 MED ORDER — ROCURONIUM BROMIDE 100 MG/10ML IV SOLN
INTRAVENOUS | Status: DC | PRN
  Administered 2017-12-14: 40 mg via INTRAVENOUS

## 2017-12-14 MED ORDER — PANTOPRAZOLE SODIUM 40 MG PO TBEC: 40.0000 mg | DELAYED_RELEASE_TABLET | Freq: Every day | ORAL | Status: DC

## 2017-12-14 MED ORDER — ONDANSETRON 4 MG PO TBDP: 4.0000 mg | ORAL_TABLET | Freq: Four times a day (QID) | ORAL | Status: DC | PRN

## 2017-12-14 MED ORDER — FLUTICASONE PROPIONATE 50 MCG/ACT NA SUSP: 2.0000 | NASAL | Status: DC | PRN

## 2017-12-14 MED ORDER — BUPIVACAINE-EPINEPHRINE 0.5% -1:200000 IJ SOLN
INTRAMUSCULAR | Status: DC | PRN
  Administered 2017-12-14: 13 mL

## 2017-12-14 MED ORDER — ASPIRIN EC 81 MG PO TBEC: 81.0000 mg | DELAYED_RELEASE_TABLET | Freq: Every day | ORAL | Status: DC

## 2017-12-14 MED ORDER — HYDROMORPHONE HCL 1 MG/ML IJ SOLN
0.5000 mg | INTRAMUSCULAR | Status: DC | PRN
  Administered 2017-12-15: 0.5 mg via INTRAVENOUS
  Filled 2017-12-14 (×2): qty 1

## 2017-12-14 MED ORDER — INSULIN ASPART 100 UNIT/ML ~~LOC~~ SOLN: 0.0000 [IU] | Freq: Every day | SUBCUTANEOUS | Status: DC

## 2017-12-14 MED ORDER — PROPOFOL 10 MG/ML IV BOLUS
INTRAVENOUS | Status: AC
  Filled 2017-12-14: qty 20

## 2017-12-14 MED ORDER — PHENYLEPHRINE HCL 10 MG/ML IJ SOLN
INTRAVENOUS | Status: DC | PRN
  Administered 2017-12-14: 25 ug/min via INTRAVENOUS

## 2017-12-14 MED ORDER — ONDANSETRON HCL 4 MG/2ML IJ SOLN: 4.0000 mg | Freq: Four times a day (QID) | INTRAMUSCULAR | Status: DC | PRN

## 2017-12-14 MED ORDER — PIPERACILLIN-TAZOBACTAM 3.375 G IVPB 30 MIN
3.3750 g | Freq: Once | INTRAVENOUS | Status: AC
  Administered 2017-12-14: 3.375 g via INTRAVENOUS
  Filled 2017-12-14: qty 50

## 2017-12-14 MED ORDER — CANAGLIFLOZIN 100 MG PO TABS
100.0000 mg | ORAL_TABLET | Freq: Every day | ORAL | Status: DC
  Administered 2017-12-15: 100 mg via ORAL
  Filled 2017-12-14: qty 1

## 2017-12-14 MED ORDER — SUGAMMADEX SODIUM 200 MG/2ML IV SOLN
INTRAVENOUS | Status: DC | PRN
  Administered 2017-12-14: 150 mg via INTRAVENOUS

## 2017-12-14 MED ORDER — SODIUM CHLORIDE 0.9 % IJ SOLN
INTRAMUSCULAR | Status: DC | PRN
  Administered 2017-12-14: 13 mL

## 2017-12-14 MED ORDER — PROPOFOL 10 MG/ML IV BOLUS
INTRAVENOUS | Status: DC | PRN
  Administered 2017-12-14: 200 mg via INTRAVENOUS

## 2017-12-14 MED ORDER — SCOPOLAMINE 1 MG/3DAYS TD PT72
MEDICATED_PATCH | TRANSDERMAL | Status: AC
  Administered 2017-12-14: 1.5 mg
  Filled 2017-12-14: qty 1

## 2017-12-14 SURGICAL SUPPLY — 39 items
APPLIER CLIP 5 13 M/L LIGAMAX5 (MISCELLANEOUS)
BLADE CLIPPER SURG (BLADE) ×3 IMPLANT
CANISTER SUCT 3000ML PPV (MISCELLANEOUS) ×3 IMPLANT
CHLORAPREP W/TINT 26ML (MISCELLANEOUS) ×3 IMPLANT
CLIP APPLIE 5 13 M/L LIGAMAX5 (MISCELLANEOUS) IMPLANT
COVER SURGICAL LIGHT HANDLE (MISCELLANEOUS) ×3 IMPLANT
CUTTER FLEX LINEAR 45M (STAPLE) ×3 IMPLANT
DERMABOND ADVANCED (GAUZE/BANDAGES/DRESSINGS) ×2
DERMABOND ADVANCED .7 DNX12 (GAUZE/BANDAGES/DRESSINGS) ×1 IMPLANT
ELECT REM PT RETURN 9FT ADLT (ELECTROSURGICAL) ×3
ELECTRODE REM PT RTRN 9FT ADLT (ELECTROSURGICAL) ×1 IMPLANT
GLOVE BIO SURGEON STRL SZ7.5 (GLOVE) ×3 IMPLANT
GLOVE INDICATOR 8.0 STRL GRN (GLOVE) ×3 IMPLANT
GOWN STRL REUS W/ TWL LRG LVL3 (GOWN DISPOSABLE) ×2 IMPLANT
GOWN STRL REUS W/ TWL XL LVL3 (GOWN DISPOSABLE) ×1 IMPLANT
GOWN STRL REUS W/TWL LRG LVL3 (GOWN DISPOSABLE) ×4
GOWN STRL REUS W/TWL XL LVL3 (GOWN DISPOSABLE) ×2
KIT BASIN OR (CUSTOM PROCEDURE TRAY) ×3 IMPLANT
KIT ROOM TURNOVER OR (KITS) ×3 IMPLANT
NS IRRIG 1000ML POUR BTL (IV SOLUTION) ×3 IMPLANT
PAD ARMBOARD 7.5X6 YLW CONV (MISCELLANEOUS) ×6 IMPLANT
PENCIL BUTTON HOLSTER BLD 10FT (ELECTRODE) ×3 IMPLANT
POUCH SPECIMEN RETRIEVAL 10MM (ENDOMECHANICALS) ×3 IMPLANT
RELOAD 45 VASCULAR/THIN (ENDOMECHANICALS) IMPLANT
RELOAD STAPLE TA45 3.5 REG BLU (ENDOMECHANICALS) ×3 IMPLANT
SCISSORS LAP 5X35 DISP (ENDOMECHANICALS) ×3 IMPLANT
SET IRRIG TUBING LAPAROSCOPIC (IRRIGATION / IRRIGATOR) ×3 IMPLANT
SHEARS HARMONIC ACE PLUS 36CM (ENDOMECHANICALS) ×3 IMPLANT
SLEEVE ENDOPATH XCEL 5M (ENDOMECHANICALS) ×3 IMPLANT
SPECIMEN JAR SMALL (MISCELLANEOUS) ×3 IMPLANT
SUT MNCRL AB 4-0 PS2 18 (SUTURE) ×3 IMPLANT
TOWEL OR 17X24 6PK STRL BLUE (TOWEL DISPOSABLE) IMPLANT
TOWEL OR 17X26 10 PK STRL BLUE (TOWEL DISPOSABLE) ×3 IMPLANT
TRAY FOLEY CATH SILVER 16FR (SET/KITS/TRAYS/PACK) ×3 IMPLANT
TRAY LAPAROSCOPIC MC (CUSTOM PROCEDURE TRAY) ×3 IMPLANT
TROCAR XCEL BLUNT TIP 100MML (ENDOMECHANICALS) ×3 IMPLANT
TROCAR XCEL NON-BLD 5MMX100MML (ENDOMECHANICALS) ×3 IMPLANT
TUBING INSUFFLATION (TUBING) ×3 IMPLANT
WATER STERILE IRR 1000ML POUR (IV SOLUTION) IMPLANT

## 2017-12-14 NOTE — Anesthesia Preprocedure Evaluation (Addendum)
Anesthesia Evaluation  Patient identified by MRN, date of birth, ID band Patient awake    Reviewed: Allergy & Precautions, NPO status , Patient's Chart, lab work & pertinent test results  History of Anesthesia Complications Negative for: history of anesthetic complications  Airway Mallampati: II  TM Distance: >3 FB Neck ROM: Full    Dental no notable dental hx. (+) Dental Advisory Given   Pulmonary former smoker,    Pulmonary exam normal        Cardiovascular Normal cardiovascular exam     Neuro/Psych PSYCHIATRIC DISORDERS Anxiety Depression negative neurological ROS     GI/Hepatic GERD  ,(+) Hepatitis -, B  Endo/Other  diabetes  Renal/GU      Musculoskeletal negative musculoskeletal ROS (+)   Abdominal   Peds  Hematology  (+) HIV,   Anesthesia Other Findings   Reproductive/Obstetrics negative OB ROS                            Anesthesia Physical Anesthesia Plan  ASA: II and emergent  Anesthesia Plan: General   Post-op Pain Management:    Induction: Intravenous, Rapid sequence and Cricoid pressure planned  PONV Risk Score and Plan: 3 and Ondansetron, Dexamethasone and Scopolamine patch - Pre-op  Airway Management Planned: Oral ETT  Additional Equipment:   Intra-op Plan:   Post-operative Plan: Extubation in OR  Informed Consent: I have reviewed the patients History and Physical, chart, labs and discussed the procedure including the risks, benefits and alternatives for the proposed anesthesia with the patient or authorized representative who has indicated his/her understanding and acceptance.   Dental advisory given  Plan Discussed with: CRNA and Anesthesiologist  Anesthesia Plan Comments:        Anesthesia Quick Evaluation

## 2017-12-14 NOTE — Op Note (Signed)
Frederick Abbott [id number]   PRE-OPERATIVE DIAGNOSIS:  ACUTE NON-PERFORATED APPENDICITIS  POST-OPERATIVE DIAGNOSIS:  SAME  Procedure(s): APPENDECTOMY LAPAROSCOPIC  SURGEON:  Stephanie Coup. Bay Wayson, M.D.  ASSISTANT: none  ANESTHESIA: General endotracheal  EBL:   5cc  DRAINS: none  SPECIMEN:  Appendix  COUNTS:  Sponge, needle and instrument counts were reported correct x2 at conclusion of the operation  DISPOSITION:  PACU in satisfactory condition  FINDINGS: Inflamed appendix, nonperforated   COMPLICATIONS: None  INDICATIONS:  Frederick Abbott is an 47 y.o. male with hx of DM, HIV on HAART, GERD who is here with 1d hx of RLQ pain - began last night before going to bed. He watched it overnight and on waking, noted increased pain when he rolled over in bed. Never had this before. No fevers; +chills. Denies n/v - ate breakfast without much difficulty. Currently nothing makes pain better or worse. Pain does not radiate.  AF VS normal Abd soft, nondistended; exquisitely tender to palpation in RLQ; negative Rovsing's; no rebound/guarding elsewhere.  WBC 11.6  CT A/P demonstrated acute appendicitis - dilated to 14mm. Nonperforated.  The anatomy & physiology of the GI tract was discussed at length with the patient and his husband, [name].  The pathophysiology of appendicitis was discussed. The treatment options for appendicitis were elaborated including both operative or nonoperative management. We discussed the possibility of antibiotics alone with a good short-term result but higher rates of recurrence in the coming years and potential for longer in patient stay for IV abx should he elect to undergo nonoperative management.  I explained laparoscopic techniques with possible need for an open approach as well.  The planned procedure, material risks such as bleeding, infection, abscess, leak from staple line, need for transfusion, injury to other organs -  intestines/bladder/ureter/colon, need for additional procedures, trocar site complications, hernia, heart attack, stroke, death, and other risks were discussed.  I noted a good likelihood this will help address the problem 50-90% chance. Goals of post-operative recovery were discussed as well. He and his husband's questions were answered to their satisfaction and they elected to proceed with surgery.    DESCRIPTION:   The patient was identified & brought into the operating room. SCDs were in place and functioning. General endotracheal anesthesia was administered. Preoperative antibiotics were administered. The patient was positioned supine with left arm tucked. A foley catheter was inserted under sterile conditions. The abdomen was prepped and draped in the standard sterile fashion. A surgical timeout confirmed our plan.  A small incision was made in the infraumbilical fold. The subcutaneous tissue was dissected and the umbilical stalk identified. The stalk was grasped with a Kocher and retracted outwardly. The infraumbilical fascia was exposed and incised. Peritoneal entry was carefully made bluntly. An 0 Vicryl purse-string suture was placed and the [facility] port was introduced into the abdomen.  CO2 insufflation commenced to . The laparoscope was inserted and inspection confirmed no evidence of injury. The patient was then positioned in Trendelenburg. Two additional ports were placed - one in left lower quadrant and another in the suprapubic midline. The patient was then placed in the left side down position.  The small bowel and omentum was reflected out of the pelvis.  The terminal ileum, cecum, and ascending colon all appeared normal.  The terminal ileum was gently mobilized to the cecum in a lateral to medial fashion. Care was taken to avoid injuring any retroperitoneal structures. The attachments to the appendix from the ascending colon and cecal mesentery were  freed.  The appendix was elevated.   The base of the appendix was circumferentially dissected. The base was noted to be viable and healthy appearing.  The appendiceal mesentery was then ligated using a harmonic scalpel.  Hemostasis was then verified. The appendix was then stapled with an Endo GIA blue load, taking a small healthy cuff of viable cecum. The appendix was placed in an Endo Catch bag.  The right lower quadrant was irrigated. Hemostasis was noted to be achieved - taking time to inspect the ligated mesoappendix, colon mesentery, and retroperitoneum. Staple line was noted to be intact on the cecum with no bleeding. There was no perforation or injury. The right lower quadrant appeared clean and as such, no drain was placed.  The left lower quadrant and suprapubic ports were removed under direct visualization. The CO2 was exhausted from the abdomen.  The umbilical trocar and Endo Catch bag were removed from the peritoneal cavity.  The appendix was then passed off the specimen. The umbilical fascial 0 Vicryl suture was then tied.  The fascial closure was inspected and noted to be intact. The skin of all port sites was then approximated using 4-0 Monocryl suture. The incisions were dressed with Dermabond.  The patient was then extubated and transferred to a stretcher for transport to PACU in satisfactory condition.

## 2017-12-14 NOTE — ED Triage Notes (Signed)
Patient complains of RLQ pain that started last night with diarrhea. Pain worse with ambulation, ND

## 2017-12-14 NOTE — Anesthesia Procedure Notes (Signed)
Procedure Name: Intubation Date/Time: 12/14/2017 7:23 PM Performed by: Oletta Lamas, CRNA Pre-anesthesia Checklist: Patient identified, Emergency Drugs available, Suction available and Patient being monitored Patient Re-evaluated:Patient Re-evaluated prior to induction Oxygen Delivery Method: Circle System Utilized Preoxygenation: Pre-oxygenation with 100% oxygen Induction Type: IV induction Ventilation: Mask ventilation without difficulty Laryngoscope Size: Mac and 4 Grade View: Grade I Tube type: Oral Tube size: 7.5 mm Number of attempts: 1 Airway Equipment and Method: Stylet Placement Confirmation: ETT inserted through vocal cords under direct vision,  positive ETCO2 and breath sounds checked- equal and bilateral Secured at: 22 cm Tube secured with: Tape Dental Injury: Teeth and Oropharynx as per pre-operative assessment

## 2017-12-14 NOTE — ED Provider Notes (Signed)
[facility] EMERGENCY DEPARTMENT Provider Note   CSN: [id number] Arrival date & time: 12/14/17  4098     History   Chief Complaint Chief Complaint  Patient presents with  . Abdominal Pain    HPI Frederick Abbott is a 47 y.o. male.  Patient c/o rlq abd pain since last evening. Pain constant, dull, moderate, non radiating, worse w palpation. Decreased appetite. No vomiting. Did have loose stool today. No dysuria or hematuria. No scrotal or testicular pain. Only prior abd surgery is previous cholecystectomy.    The history is provided by the patient.  Abdominal Pain   Pertinent negatives include fever, dysuria and headaches.    Past Medical History:  Diagnosis Date  . Allergy   . Anxiety   . Belching   . Cancer [facility])    pt denies any dx of cancer  . Cholecystitis chronic   . Depression   . Diabetes mellitus   . Diarrhea   . GERD (gastroesophageal reflux disease)   . Hepatitis    hepatitis B  . HIV (human immunodeficiency virus infection) (HCC)   . Hypercholesterolemia   . Neuromuscular disorder (HCC)    neuropathy  . Neuropathy   . Pneumonia    childhood  . Seizures (HCC)    as a child, none since age 68    Patient Active Problem List   Diagnosis Date Noted  . Nausea and vomiting 10/20/2017  . Rectal bleeding 12/27/2014  . Nausea with vomiting 12/16/2014  . Cough 12/16/2014  . Elevated LFTs 12/16/2014  . Left wrist pain 09/16/2014  . Wart 08/15/2014  . Eustachian tube dysfunction 08/09/2013  . UTI (urinary tract infection) 08/09/2013  . Routine general medical examination at a health care facility 02/07/2013  . Dyshidrotic eczema 02/07/2013  . Chronic cholecystitis with calculus 05/09/2012  . Heel pain 09/23/2011  . DM II (diabetes mellitus, type II), controlled (HCC) 12/11/2009  . DECREASED HEARING, BILATERAL 09/18/2009  . LOW BACK PAIN, CHRONIC 10/17/2008  . VENEREAL WART 08/20/2008  . Hyperlipidemia 08/20/2008  . GERD  06/05/2007  . Human immunodeficiency virus (HIV) disease (HCC) 06/01/2007  . Anxiety state 06/01/2007  . Anxiety 06/01/2007  . ALLERGIC RHINITIS 06/01/2007  . CARBUNCLE/FURUNCLE NOS 06/01/2007  . PSORIASIS NEC 06/01/2007  . NECROSIS, ASEPTIC, FEMUR HEAD/NECK 06/01/2007  . SEIZURE DISORDER 06/01/2007  . HEPATITIS B, HX OF 06/01/2007    Past Surgical History:  Procedure Laterality Date  . CHOLECYSTECTOMY  05/29/2012   Procedure: LAPAROSCOPIC CHOLECYSTECTOMY WITH INTRAOPERATIVE CHOLANGIOGRAM;  Surgeon: Wilmon Arms. Corliss Skains, MD;  Location: [facility]S;  Service: General;  Laterality: N/A;  . JOINT REPLACEMENT     bil total hips  . TONSILLECTOMY  1973       Home Medications    Prior to Admission medications   Medication Sig Start Date End Date Taking? Authorizing Provider  aspirin 81 MG tablet Take 81 mg by mouth daily.   Yes [provider]  atorvastatin (LIPITOR) 10 MG tablet TAKE 1 TABLET BY MOUTH NIGHTLY 08/08/17  Yes Sheliah Hatch, MD  buPROPion (WELLBUTRIN XL) 300 MG 24 hr tablet TAKE 1 TABLET BY MOUTH EVERY DAY 10/10/17  Yes Sheliah Hatch, MD  fenofibrate 160 MG tablet TAKE 1 TABLET (160 MG TOTAL) BY MOUTH DAILY. 07/12/17  Yes Sheliah Hatch, MD  fluticasone (FLONASE) 50 MCG/ACT nasal spray Place 2 sprays into both nostrils as needed for allergies.    Yes [provider]  GENVOYA 150-150-200-10 MG TABS tablet  TAKE ONE TABLET BY MOUTH ONCE DAILY WITH BREAKFAST. STORE IN ORIGINALCONTAINER AT ROOM TEMPERATURE. 09/28/17  Yes Cliffton Asters, MD  ibuprofen (ADVIL,MOTRIN) 200 MG tablet Take 600 mg by mouth every 6 (six) hours as needed for moderate pain.   Yes [provider]  INVOKANA 100 MG TABS tablet TAKE 1 TABLET (100 MG TOTAL) BY MOUTH DAILY BEFORE BREAKFAST. 07/06/17  Yes Sheliah Hatch, MD  lisinopril (PRINIVIL,ZESTRIL) 5 MG tablet TAKE 1 TABLET (5 MG TOTAL) BY MOUTH DAILY. 09/28/17  Yes Sheliah Hatch, MD  loratadine (CLARITIN) 10 MG  tablet Take 10 mg by mouth daily.   Yes [provider]  magnesium oxide (MAG-OX) 400 MG tablet Take 400 mg by mouth daily.   Yes [provider]  Menthol (ICY HOT) 5 % PTCH Apply 1 patch topically as needed (knee pain).   Yes [provider]  Multiple Vitamin (MULTIVITAMIN WITH MINERALS) TABS tablet Take 1 tablet by mouth daily.   Yes [provider]  pantoprazole (PROTONIX) 40 MG tablet TAKE 1 TABLET (40 MG TOTAL) BY MOUTH 2 (TWO) TIMES DAILY. 08/15/17  Yes Sheliah Hatch, MD  Probiotic Product (PROBIOTIC DAILY PO) Take by mouth.   Yes [provider]  tadalafil (CIALIS) 20 MG tablet Take 1/2 to 1 tablet as needed. 07/12/17  Yes Sheliah Hatch, MD  VICTOZA 18 MG/3ML SOPN INJECT 1.8 MG (0.3ML) INTO THE SKIN DAILY 09/28/17  Yes Sheliah Hatch, MD  BD PEN NEEDLE NANO U/F 32G X 4 MM MISC USE 1 NEEDLE TO INJECT 24 UNITS INTO THE SKIN DAILY. 11/10/17   Sheliah Hatch, MD  glucose blood test strip 1 each by Other route 3 (three) times daily as needed. Use as directed.  08/10/11   Cliffton Asters, MD  [facility] DELICA LANCETS 33G MISC by Does not apply route.    [provider]    Family History Family History  Problem Relation Age of Onset  . Cancer Mother        breast  . Cancer Maternal Grandmother        melanoma-nose  . Cancer Paternal Grandfather        melanoma-ear  . Colon cancer Neg Hx   . Esophageal cancer Neg Hx   . Rectal cancer Neg Hx   . Stomach cancer Neg Hx     Social History Social History   Tobacco Use  . Smoking status: Former Smoker    Packs/day: 0.80    Years: 20.00    Pack years: 16.00    Types: Cigarettes    Last attempt to quit: 05/06/2006    Years since quitting: 11.6  . Smokeless tobacco: Never Used  Substance Use Topics  . Alcohol use: Yes    Alcohol/week: 0.5 oz    Types: 1 Standard drinks or equivalent per week    Comment: 1-2 drinks every week  . Drug use: Yes    Types: Marijuana     Comment: last smoked marijuana 4 wekks ago per pt     Allergies   Phenytoin and Metformin and related   Review of Systems Review of Systems  Constitutional: Negative for fever.  HENT: Negative for sore throat.   Eyes: Negative for redness.  Respiratory: Negative for shortness of breath.   Cardiovascular: Negative for chest pain.  Gastrointestinal: Positive for abdominal pain.  Genitourinary: Negative for dysuria and flank pain.  Musculoskeletal: Negative for back pain and neck pain.  Skin: Negative for rash.  Neurological:  Negative for headaches.  Hematological: Does not bruise/bleed easily.  Psychiatric/Behavioral: Negative for confusion.     Physical Exam Updated Vital Signs BP 110/61 (BP Location: Left Arm)   Pulse 86   Temp 98.2 F (36.8 C) (Oral)   Resp 16   SpO2 100%   Physical Exam  Constitutional: He appears well-developed and well-nourished. No distress.  HENT:  Mouth/Throat: Oropharynx is clear and moist.  Eyes: Conjunctivae are normal.  Neck: Neck supple. No tracheal deviation present.  Cardiovascular: Normal rate, regular rhythm, normal heart sounds and intact distal pulses.  Pulmonary/Chest: Effort normal and breath sounds normal. No accessory muscle usage. No respiratory distress.  Abdominal: Soft. Bowel sounds are normal. He exhibits no distension. There is tenderness.  Moderate RLQ tenderness.   Genitourinary:  Genitourinary Comments: No cva tenderness. No scrotal/testicular pain/tenderness.   Musculoskeletal: He exhibits no edema.  Neurological: He is alert.  Skin: Skin is warm and dry. He is not diaphoretic.  Psychiatric: He has a normal mood and affect.  Nursing note and vitals reviewed.    ED Treatments / Results  Labs (all labs ordered are listed, but only abnormal results are displayed) Results for orders placed or performed during the hospital encounter of 12/14/17  MRSA PCR Screening  Result Value Ref Range   MRSA by PCR NEGATIVE  NEGATIVE  Lipase, blood  Result Value Ref Range   Lipase 44 11 - 51 U/L  Comprehensive metabolic panel  Result Value Ref Range   Sodium 135 135 - 145 mmol/L   Potassium 3.6 3.5 - 5.1 mmol/L   Chloride 102 101 - 111 mmol/L   CO2 21 (L) 22 - 32 mmol/L   Glucose, Bld 199 (H) 65 - 99 mg/dL   BUN 10 6 - 20 mg/dL   Creatinine, Ser 1.611.23 0.61 - 1.24 mg/dL   Calcium 9.1 8.9 - 09.610.3 mg/dL   Total Protein 6.8 6.5 - 8.1 g/dL   Albumin 4.2 3.5 - 5.0 g/dL   AST 35 15 - 41 U/L   ALT 34 17 - 63 U/L   Alkaline Phosphatase 60 38 - 126 U/L   Total Bilirubin 1.3 (H) 0.3 - 1.2 mg/dL   GFR calc non Af Amer >60 >60 mL/min   GFR calc Af Amer >60 >60 mL/min   Anion gap 12 5 - 15  CBC  Result Value Ref Range   WBC 11.6 (H) 4.0 - 10.5 K/uL   RBC 5.12 4.22 - 5.81 MIL/uL   Hemoglobin 16.1 13.0 - 17.0 g/dL   HCT 04.546.2 40.939.0 - 81.152.0 %   MCV 90.2 78.0 - 100.0 fL   MCH 31.4 26.0 - 34.0 pg   MCHC 34.8 30.0 - 36.0 g/dL   RDW 91.412.9 78.211.5 - 95.615.5 %   Platelets 279 150 - 400 K/uL  Urinalysis, Routine w reflex microscopic  Result Value Ref Range   Color, Urine YELLOW YELLOW   APPearance CLEAR CLEAR   Specific Gravity, Urine 1.035 (H) 1.005 - 1.030   pH 5.0 5.0 - 8.0   Glucose, UA >=500 (A) NEGATIVE mg/dL   Hgb urine dipstick NEGATIVE NEGATIVE   Bilirubin Urine NEGATIVE NEGATIVE   Ketones, ur NEGATIVE NEGATIVE mg/dL   Protein, ur NEGATIVE NEGATIVE mg/dL   Nitrite NEGATIVE NEGATIVE   Leukocytes, UA NEGATIVE NEGATIVE   RBC / HPF 0-5 0 - 5 RBC/hpf   WBC, UA NONE SEEN 0 - 5 WBC/hpf   Bacteria, UA NONE SEEN NONE SEEN   Squamous Epithelial / LPF  NONE SEEN NONE SEEN   Mucus PRESENT   Glucose, capillary  Result Value Ref Range   Glucose-Capillary 115 (H) 65 - 99 mg/dL  CBC  Result Value Ref Range   WBC 7.5 4.0 - 10.5 K/uL   RBC 4.46 4.22 - 5.81 MIL/uL   Hemoglobin 13.7 13.0 - 17.0 g/dL   HCT 16.1 09.6 - 04.5 %   MCV 90.8 78.0 - 100.0 fL   MCH 30.7 26.0 - 34.0 pg   MCHC 33.8 30.0 - 36.0 g/dL   RDW 40.9 81.1  - 91.4 %   Platelets 214 150 - 400 K/uL  Basic metabolic panel  Result Value Ref Range   Sodium 137 135 - 145 mmol/L   Potassium 3.8 3.5 - 5.1 mmol/L   Chloride 104 101 - 111 mmol/L   CO2 22 22 - 32 mmol/L   Glucose, Bld 129 (H) 65 - 99 mg/dL   BUN 11 6 - 20 mg/dL   Creatinine, Ser 7.82 0.61 - 1.24 mg/dL   Calcium 8.2 (L) 8.9 - 10.3 mg/dL   GFR calc non Af Amer >60 >60 mL/min   GFR calc Af Amer >60 >60 mL/min   Anion gap 11 5 - 15  Magnesium  Result Value Ref Range   Magnesium 2.1 1.7 - 2.4 mg/dL  Phosphorus  Result Value Ref Range   Phosphorus 3.1 2.5 - 4.6 mg/dL  Glucose, capillary  Result Value Ref Range   Glucose-Capillary 143 (H) 65 - 99 mg/dL  Glucose, capillary  Result Value Ref Range   Glucose-Capillary 103 (H) 65 - 99 mg/dL  Glucose, capillary  Result Value Ref Range   Glucose-Capillary 116 (H) 65 - 99 mg/dL  I-Stat CG4 Lactic Acid, ED  Result Value Ref Range   Lactic Acid, Venous 2.09 (HH) 0.5 - 1.9 mmol/L   Comment NOTIFIED PHYSICIAN   I-Stat CG4 Lactic Acid, ED  Result Value Ref Range   Lactic Acid, Venous 1.85 0.5 - 1.9 mmol/L   EKG  EKG Interpretation None       Radiology No results found.  Procedures Procedures (including critical care time)  Medications Ordered in ED Medications - No data to display   Initial Impression / Assessment and Plan / ED Course  I have reviewed the triage vital signs and the nursing notes.  Pertinent labs & imaging results that were available during my care of the patient were reviewed by me and considered in my medical decision making (see chart for details).  Iv ns bolus.   Labs.  Reviewed nursing notes and prior charts for additional history.   CT to r/o appendicitis.   CT reviewed by me - c/w appendicitis. Pt informed of result.  Zosyn iv. General surgery consulted.   Final Clinical Impressions(s) / ED Diagnoses   Final diagnoses:  None    ED Discharge Orders    None       Cathren Laine,  MD 12/16/17 [phone]

## 2017-12-14 NOTE — Transfer of Care (Signed)
Immediate Anesthesia Transfer of Care Note  Patient: Frederick Abbott  Procedure(s) Performed: APPENDECTOMY LAPAROSCOPIC (N/A Abdomen)  Patient Location: PACU  Anesthesia Type:General  Level of Consciousness: awake, alert , oriented and patient cooperative  Airway & Oxygen Therapy: Patient Spontanous Breathing  Post-op Assessment: Report given to RN and Post -op Vital signs reviewed and stable  Post vital signs: Reviewed and stable  Last Vitals:  Vitals:   12/14/17 1600 12/14/17 1820  BP: (!) 109/57 (!) 117/91  Pulse: 87 91  Resp: 18 16  Temp:    SpO2: 96% 100%    Last Pain:  Vitals:   12/14/17 1820  TempSrc:   PainSc: 4          Complications: No apparent anesthesia complications

## 2017-12-14 NOTE — Anesthesia Postprocedure Evaluation (Signed)
Anesthesia Post Note  Patient: Frederick Abbott  Procedure(s) Performed: APPENDECTOMY LAPAROSCOPIC (N/A Abdomen)     Patient location during evaluation: PACU Anesthesia Type: General Level of consciousness: sedated Pain management: pain level controlled Vital Signs Assessment: post-procedure vital signs reviewed and stable Respiratory status: spontaneous breathing and respiratory function stable Cardiovascular status: stable Postop Assessment: no apparent nausea or vomiting Anesthetic complications: no    Last Vitals:  Vitals:   12/14/17 2155 12/14/17 2223  BP: 111/62 114/67  Pulse: 87 85  Resp: 19 18  Temp:  36.8 C  SpO2: 96% 99%    Last Pain:  Vitals:   12/14/17 2223  TempSrc: Oral  PainSc:                  Saad Buhl DANIEL

## 2017-12-14 NOTE — H&P (Signed)
CC: RLQ pain x1d  HPI: Frederick Abbott is an 47 y.o. male with hx of DM, HIV on HAART, GERD who is here with 1d hx of RLQ pain - began last night before going to bed. He watched it overnight and on waking, noted increased pain when he rolled over in bed. Never had this before. No fevers; +chills. Denies n/v - ate breakfast without much difficulty. Currently nothing makes pain better or worse. Pain does not radiate.  AF VS normal Abd soft, nondistended; exquisitely tender to palpation in RLQ; negative Rovsing's; no rebound/guarding elsewhere.  WBC 11.6  CT A/P demonstrated acute appendicitis - dilated to 77m. Nonperforated.  Past Medical History:  Diagnosis Date  . Allergy   . Anxiety   . Belching   . Cancer ([facility]    pt denies any dx of cancer  . Cholecystitis chronic   . Depression   . Diabetes mellitus   . Diarrhea   . GERD (gastroesophageal reflux disease)   . Hepatitis    hepatitis B  . HIV (human immunodeficiency virus infection) ([facility]   . Hypercholesterolemia   . Neuromuscular disorder (HCC)    neuropathy  . Neuropathy   . Pneumonia    childhood  . Seizures ([facility]    as a child, none since age 47   Past Surgical History:  Procedure Laterality Date  . CHOLECYSTECTOMY  05/29/2012   Procedure: LAPAROSCOPIC CHOLECYSTECTOMY WITH INTRAOPERATIVE CHOLANGIOGRAM;  Surgeon: MImogene Burn TGeorgette Dover MD;  Location: [facility];  Service: General;  Laterality: N/A;  . JOINT REPLACEMENT     bil total hips  . TONSILLECTOMY  1973    Family History  Problem Relation Age of Onset  . Cancer Mother        breast  . Cancer Maternal Grandmother        melanoma-nose  . Cancer Paternal Grandfather        melanoma-ear  . Colon cancer Neg Hx   . Esophageal cancer Neg Hx   . Rectal cancer Neg Hx   . Stomach cancer Neg Hx     Social:  reports that he quit smoking about 11 years ago. His smoking use included cigarettes. He has a 16.00 pack-year smoking history. he has never used smokeless  tobacco. He reports that he drinks about 0.5 oz of alcohol per week. He reports that he uses drugs. Drug: Marijuana.  Allergies:  Allergies  Allergen Reactions  . Phenytoin Other (See Comments)    Childhood pt unsure  . Metformin And Related Nausea And Vomiting    Medications: I have reviewed the patient's current medications.  Results for orders placed or performed during the hospital encounter of 12/14/17 (from the past 48 hour(s))  Lipase, blood     Status: None   Collection Time: 12/14/17  9:56 AM  Result Value Ref Range   Lipase 44 11 - 51 U/L    Comment: Performed at [facility] [address], [place] [address] Comprehensive metabolic panel     Status: Abnormal   Collection Time: 12/14/17  9:56 AM  Result Value Ref Range   Sodium 135 135 - 145 mmol/L   Potassium 3.6 3.5 - 5.1 mmol/L   Chloride 102 101 - 111 mmol/L   CO2 21 (L) 22 - 32 mmol/L   Glucose, Bld 199 (H) 65 - 99 mg/dL   BUN 10 6 - 20 mg/dL   Creatinine, Ser 1.23 0.61 - 1.24 mg/dL   Calcium 9.1  8.9 - 10.3 mg/dL   Total Protein 6.8 6.5 - 8.1 g/dL   Albumin 4.2 3.5 - 5.0 g/dL   AST 35 15 - 41 U/L   ALT 34 17 - 63 U/L   Alkaline Phosphatase 60 38 - 126 U/L   Total Bilirubin 1.3 (H) 0.3 - 1.2 mg/dL   GFR calc non Af Amer >60 >60 mL/min   GFR calc Af Amer >60 >60 mL/min    Comment: (NOTE) The eGFR has been calculated using the CKD EPI equation. This calculation has not been validated in all clinical situations. eGFR's persistently <60 mL/min signify possible Chronic Kidney Disease.    Anion gap 12 5 - 15    Comment: Performed at [facility] [address] [place], [place] [address]  CBC     Status: Abnormal   Collection Time: 12/14/17  9:56 AM  Result Value Ref Range   WBC 11.6 (H) 4.0 - 10.5 K/uL   RBC 5.12 4.22 - 5.81 MIL/uL   Hemoglobin 16.1 13.0 - 17.0 g/dL   HCT 46.2 39.0 - 52.0 %   MCV 90.2 78.0 - 100.0 fL   MCH 31.4 26.0 - 34.0 pg   MCHC 34.8 30.0 - 36.0 g/dL   RDW  12.9 11.5 - 15.5 %   Platelets 279 150 - 400 K/uL    Comment: Performed at [facility], [place] [address], [place], [place] [address]  Urinalysis, Routine w reflex microscopic     Status: Abnormal   Collection Time: 12/14/17 10:01 AM  Result Value Ref Range   Color, Urine YELLOW YELLOW   APPearance CLEAR CLEAR   Specific Gravity, Urine 1.035 (H) 1.005 - 1.030   pH 5.0 5.0 - 8.0   Glucose, UA >=500 (A) NEGATIVE mg/dL   Hgb urine dipstick NEGATIVE NEGATIVE   Bilirubin Urine NEGATIVE NEGATIVE   Ketones, ur NEGATIVE NEGATIVE mg/dL   Protein, ur NEGATIVE NEGATIVE mg/dL   Nitrite NEGATIVE NEGATIVE   Leukocytes, UA NEGATIVE NEGATIVE   RBC / HPF 0-5 0 - 5 RBC/hpf   WBC, UA NONE SEEN 0 - 5 WBC/hpf   Bacteria, UA NONE SEEN NONE SEEN   Squamous Epithelial / LPF NONE SEEN NONE SEEN   Mucus PRESENT     Comment: Performed at [facility] [address], [place], [place] [address]  I-Stat CG4 Lactic Acid, ED     Status: Abnormal   Collection Time: 12/14/17 10:10 AM  Result Value Ref Range   Lactic Acid, Venous 2.09 (HH) 0.5 - 1.9 mmol/L   Comment NOTIFIED PHYSICIAN   I-Stat CG4 Lactic Acid, ED     Status: None   Collection Time: 12/14/17 12:56 PM  Result Value Ref Range   Lactic Acid, Venous 1.85 0.5 - 1.9 mmol/L    Ct Abdomen Pelvis W Contrast  Result Date: 12/14/2017 CLINICAL DATA:  Right lower quadrant pain beginning last night with diarrhea. EXAM: CT ABDOMEN AND PELVIS WITH CONTRAST TECHNIQUE: Multidetector CT imaging of the abdomen and pelvis was performed using the standard protocol following bolus administration of intravenous contrast. CONTRAST:  165m ISOVUE-300 IOPAMIDOL (ISOVUE-300) INJECTION 61% COMPARISON:  None. FINDINGS: Lower chest: Normal Hepatobiliary: Previous cholecystectomy.  Abnormal liver finding. Pancreas: Normal Spleen: Normal Adrenals/Urinary Tract: Adrenal glands are normal. Kidneys are normal. Bladder is poorly seen because of streak artifact from hip  replacements. Stomach/Bowel: There is acute appendicitis with an edematous appendix with mild surrounding inflammatory change. Diameter is up to 14 mm. No  sign of rupture or abscess. Vascular/Lymphatic: Normal Reproductive: Normal Other: No free fluid or air. Musculoskeletal: No acute bone finding.  Previous hip replacements. IMPRESSION: Acute appendicitis. Appendix: Location: Retrocecal Diameter: 14 mm Appendicolith: None Mucosal hyper-enhancement: Borderline Extraluminal gas: None Periappendiceal collection: None.  Minimal edematous stranding. Electronically Signed   By: Nelson Chimes M.D.   On: 12/14/2017 17:30    ROS - all of the below systems have been reviewed with the patient and positives are indicated with bold text General: chills, fever or night sweats Eyes: blurry vision or double vision ENT: epistaxis or sore throat Allergy/Immunology: itchy/watery eyes or nasal congestion Hematologic/Lymphatic: bleeding problems, blood clots or swollen lymph nodes Endocrine: temperature intolerance or unexpected weight changes Breast: new or changing breast lumps or nipple discharge Resp: cough, shortness of breath, or wheezing CV: chest pain or dyspnea on exertion GI: as per HPI GU: dysuria, trouble voiding, or hematuria MSK: joint pain or joint stiffness Neuro: TIA or stroke symptoms Derm: pruritus and skin lesion changes Psych: anxiety and depression  PE Blood pressure (!) 109/57, pulse 87, temperature 98.2 F (36.8 C), temperature source Oral, resp. rate 18, SpO2 96 %. Constitutional: NAD; conversant; no deformities Eyes: Moist conjunctiva; no lid lag; anicteric; PERRL Neck: Trachea midline; no thyromegaly Lungs: Normal respiratory effort; no tactile fremitus CV: RRR; no palpable thrills; no pitting edema GI: Abd soft, nondistended; exquisitely ttp in RLQ; no rebound/guarding elsewhere; no palpable hepatosplenomegaly MSK: Normal ROM of extremities; no clubbing/cyanosis Psychiatric:  Appropriate affect; alert and oriented x3 Lymphatic: No palpable cervical or axillary lymphadenopathy  Results for orders placed or performed during the hospital encounter of 12/14/17 (from the past 48 hour(s))  Lipase, blood     Status: None   Collection Time: 12/14/17  9:56 AM  Result Value Ref Range   Lipase 44 11 - 51 U/L    Comment: Performed at [facility], [place] [address], [place], [place] [address]  Comprehensive metabolic panel     Status: Abnormal   Collection Time: 12/14/17  9:56 AM  Result Value Ref Range   Sodium 135 135 - 145 mmol/L   Potassium 3.6 3.5 - 5.1 mmol/L   Chloride 102 101 - 111 mmol/L   CO2 21 (L) 22 - 32 mmol/L   Glucose, Bld 199 (H) 65 - 99 mg/dL   BUN 10 6 - 20 mg/dL   Creatinine, Ser 1.23 0.61 - 1.24 mg/dL   Calcium 9.1 8.9 - 10.3 mg/dL   Total Protein 6.8 6.5 - 8.1 g/dL   Albumin 4.2 3.5 - 5.0 g/dL   AST 35 15 - 41 U/L   ALT 34 17 - 63 U/L   Alkaline Phosphatase 60 38 - 126 U/L   Total Bilirubin 1.3 (H) 0.3 - 1.2 mg/dL   GFR calc non Af Amer >60 >60 mL/min   GFR calc Af Amer >60 >60 mL/min    Comment: (NOTE) The eGFR has been calculated using the CKD EPI equation. This calculation has not been validated in all clinical situations. eGFR's persistently <60 mL/min signify possible Chronic Kidney Disease.    Anion gap 12 5 - 15    Comment: Performed at [facility] [address], [place] [address]  CBC     Status: Abnormal   Collection Time: 12/14/17  9:56 AM  Result Value Ref Range   WBC 11.6 (H) 4.0 - 10.5 K/uL   RBC 5.12 4.22 - 5.81 MIL/uL   Hemoglobin 16.1 13.0 -  17.0 g/dL   HCT 46.2 39.0 - 52.0 %   MCV 90.2 78.0 - 100.0 fL   MCH 31.4 26.0 - 34.0 pg   MCHC 34.8 30.0 - 36.0 g/dL   RDW 12.9 11.5 - 15.5 %   Platelets 279 150 - 400 K/uL    Comment: Performed at [facility], [place] [address], [place], [place] [address]  Urinalysis, Routine w reflex microscopic     Status: Abnormal   Collection Time: 12/14/17 10:01  AM  Result Value Ref Range   Color, Urine YELLOW YELLOW   APPearance CLEAR CLEAR   Specific Gravity, Urine 1.035 (H) 1.005 - 1.030   pH 5.0 5.0 - 8.0   Glucose, UA >=500 (A) NEGATIVE mg/dL   Hgb urine dipstick NEGATIVE NEGATIVE   Bilirubin Urine NEGATIVE NEGATIVE   Ketones, ur NEGATIVE NEGATIVE mg/dL   Protein, ur NEGATIVE NEGATIVE mg/dL   Nitrite NEGATIVE NEGATIVE   Leukocytes, UA NEGATIVE NEGATIVE   RBC / HPF 0-5 0 - 5 RBC/hpf   WBC, UA NONE SEEN 0 - 5 WBC/hpf   Bacteria, UA NONE SEEN NONE SEEN   Squamous Epithelial / LPF NONE SEEN NONE SEEN   Mucus PRESENT     Comment: Performed at [facility] [address], [place], [place] [address]  I-Stat CG4 Lactic Acid, ED     Status: Abnormal   Collection Time: 12/14/17 10:10 AM  Result Value Ref Range   Lactic Acid, Venous 2.09 (HH) 0.5 - 1.9 mmol/L   Comment NOTIFIED PHYSICIAN   I-Stat CG4 Lactic Acid, ED     Status: None   Collection Time: 12/14/17 12:56 PM  Result Value Ref Range   Lactic Acid, Venous 1.85 0.5 - 1.9 mmol/L    Ct Abdomen Pelvis W Contrast  Result Date: 12/14/2017 CLINICAL DATA:  Right lower quadrant pain beginning last night with diarrhea. EXAM: CT ABDOMEN AND PELVIS WITH CONTRAST TECHNIQUE: Multidetector CT imaging of the abdomen and pelvis was performed using the standard protocol following bolus administration of intravenous contrast. CONTRAST:  151m ISOVUE-300 IOPAMIDOL (ISOVUE-300) INJECTION 61% COMPARISON:  None. FINDINGS: Lower chest: Normal Hepatobiliary: Previous cholecystectomy.  Abnormal liver finding. Pancreas: Normal Spleen: Normal Adrenals/Urinary Tract: Adrenal glands are normal. Kidneys are normal. Bladder is poorly seen because of streak artifact from hip replacements. Stomach/Bowel: There is acute appendicitis with an edematous appendix with mild surrounding inflammatory change. Diameter is up to 14 mm. No sign of rupture or abscess. Vascular/Lymphatic: Normal Reproductive: Normal Other: No free  fluid or air. Musculoskeletal: No acute bone finding.  Previous hip replacements. IMPRESSION: Acute appendicitis. Appendix: Location: Retrocecal Diameter: 14 mm Appendicolith: None Mucosal hyper-enhancement: Borderline Extraluminal gas: None Periappendiceal collection: None.  Minimal edematous stranding. Electronically Signed   By: MNelson ChimesM.D.   On: 12/14/2017 17:30    A/P: CKYVON HUis an 47y.o. male with DM, HIV on HAART, GERD presents with acute nonperforated appendicitis -NPO -IVF -IV Zosyn -OR for laparoscopic vs open appendectomy; all other indicated procedures -The anatomy & physiology of the GI tract was discussed at length with the patient and his husband, BRuby Cola  The pathophysiology of appendicitis was discussed. The treatment options for appendicitis were elaborated including both operative or nonoperative management. We discussed the possibility of antibiotics alone with a good short-term result but higher rates of recurrence in the coming years and potential for longer in patient stay for IV abx should he elect to undergo nonoperative management.  I explained  laparoscopic techniques with possible need for an open approach as well.  The planned procedure, material risks such as bleeding, infection, abscess, leak from staple line, need for transfusion, injury to other organs - intestines/bladder/ureter/colon, need for additional procedures, trocar site complications, hernia, heart attack, stroke, death, and other risks were discussed.  I noted a good likelihood this will help address the problem 50-90% chance. Goals of post-operative recovery were discussed as well. He and his husband's questions were answered to their satisfaction and they elected to proceed with surgery.   Sharon Mt. Dema Severin, M.D. [facility]

## 2017-12-14 NOTE — Telephone Encounter (Signed)
Pt is on the way to the hospital

## 2017-12-14 NOTE — Telephone Encounter (Signed)
Spoke to patient regarding symptoms. Patient reports right sided abdominal discomfort x 1 day, afebrile. Per PCP, advised patient to go to ER for evaluation. Patient verbalized understanding.

## 2017-12-15 ENCOUNTER — Encounter (HOSPITAL_COMMUNITY): Payer: Self-pay | Admitting: Surgery

## 2017-12-15 LAB — GLUCOSE, CAPILLARY
GLUCOSE-CAPILLARY: 103 mg/dL — AB (ref 65–99)
Glucose-Capillary: 116 mg/dL — ABNORMAL HIGH (ref 65–99)

## 2017-12-15 LAB — BASIC METABOLIC PANEL
Anion gap: 11 (ref 5–15)
BUN: 11 mg/dL (ref 6–20)
CALCIUM: 8.2 mg/dL — AB (ref 8.9–10.3)
CHLORIDE: 104 mmol/L (ref 101–111)
CO2: 22 mmol/L (ref 22–32)
Creatinine, Ser: 1.1 mg/dL (ref 0.61–1.24)
GFR calc Af Amer: >60 mL/min (ref >60)
GFR calc non Af Amer: >60 mL/min (ref >60)
GLUCOSE: 129 mg/dL — AB (ref 65–99)
Potassium: 3.8 mmol/L (ref 3.5–5.1)
Sodium: 137 mmol/L (ref 135–145)

## 2017-12-15 LAB — CBC
HEMATOCRIT: 40.5 % (ref 39.0–52.0)
HEMOGLOBIN: 13.7 g/dL (ref 13.0–17.0)
MCH: 30.7 pg (ref 26.0–34.0)
MCHC: 33.8 g/dL (ref 30.0–36.0)
MCV: 90.8 fL (ref 78.0–100.0)
Platelets: 214 10*3/uL (ref 150–400)
RBC: 4.46 MIL/uL (ref 4.22–5.81)
RDW: 12.7 % (ref 11.5–15.5)
WBC: 7.5 10*3/uL (ref 4.0–10.5)

## 2017-12-15 LAB — MAGNESIUM: Magnesium: 2.1 mg/dL (ref 1.7–2.4)

## 2017-12-15 LAB — MRSA PCR SCREENING: MRSA BY PCR: NEGATIVE

## 2017-12-15 LAB — PHOSPHORUS: Phosphorus: 3.1 mg/dL (ref 2.5–4.6)

## 2017-12-15 MED ORDER — TRAMADOL HCL 50 MG PO TABS: 50.0000 mg | ORAL_TABLET | Freq: Four times a day (QID) | ORAL | 0 refills | Status: DC | PRN

## 2017-12-15 NOTE — Progress Notes (Signed)
Patient was given discharge instructions and medication prescription. Patient verbalized understanding of instructions and follow up appointments. Patient left unit in stable condition with spouse.

## 2017-12-15 NOTE — Discharge Instructions (Signed)

## 2017-12-15 NOTE — Discharge Summary (Signed)
[facility] Discharge Summary   Patient ID: Frederick Abbott MRN: [id number] DOB/AGE: 48/01/1971 48 y.o.  Admit date: 12/14/2017 Discharge date: 12/15/2017  Admitting Diagnosis: appendicitis  Discharge Diagnosis Patient Active Problem List   Diagnosis Date Noted  . Acute appendicitis 12/14/2017  . Nausea and vomiting 10/20/2017  . Rectal bleeding 12/27/2014  . Nausea with vomiting 12/16/2014  . Cough 12/16/2014  . Elevated LFTs 12/16/2014  . Left wrist pain 09/16/2014  . Wart 08/15/2014  . Eustachian tube dysfunction 08/09/2013  . UTI (urinary tract infection) 08/09/2013  . Routine general medical examination at a health care facility 02/07/2013  . Dyshidrotic eczema 02/07/2013  . Chronic cholecystitis with calculus 05/09/2012  . Heel pain 09/23/2011  . DM II (diabetes mellitus, type II), controlled (HCC) 12/11/2009  . DECREASED HEARING, BILATERAL 09/18/2009  . LOW BACK PAIN, CHRONIC 10/17/2008  . VENEREAL WART 08/20/2008  . Hyperlipidemia 08/20/2008  . GERD 06/05/2007  . Human immunodeficiency virus (HIV) disease (HCC) 06/01/2007  . Anxiety state 06/01/2007  . Anxiety 06/01/2007  . ALLERGIC RHINITIS 06/01/2007  . CARBUNCLE/FURUNCLE NOS 06/01/2007  . PSORIASIS NEC 06/01/2007  . NECROSIS, ASEPTIC, FEMUR HEAD/NECK 06/01/2007  . SEIZURE DISORDER 06/01/2007  . HEPATITIS B, HX OF 06/01/2007    Consultants none  Imaging: Ct Abdomen Pelvis W Contrast  Result Date: 12/14/2017 CLINICAL DATA:  Right lower quadrant pain beginning last night with diarrhea. EXAM: CT ABDOMEN AND PELVIS WITH CONTRAST TECHNIQUE: Multidetector CT imaging of the abdomen and pelvis was performed using the standard protocol following bolus administration of intravenous contrast. CONTRAST:  100mL ISOVUE-300 IOPAMIDOL (ISOVUE-300) INJECTION 61% COMPARISON:  None. FINDINGS: Lower chest: Normal Hepatobiliary: Previous cholecystectomy.  Abnormal liver finding. Pancreas: Normal Spleen:  Normal Adrenals/Urinary Tract: Adrenal glands are normal. Kidneys are normal. Bladder is poorly seen because of streak artifact from hip replacements. Stomach/Bowel: There is acute appendicitis with an edematous appendix with mild surrounding inflammatory change. Diameter is up to 14 mm. No sign of rupture or abscess. Vascular/Lymphatic: Normal Reproductive: Normal Other: No free fluid or air. Musculoskeletal: No acute bone finding.  Previous hip replacements. IMPRESSION: Acute appendicitis. Appendix: Location: Retrocecal Diameter: 14 mm Appendicolith: None Mucosal hyper-enhancement: Borderline Extraluminal gas: None Periappendiceal collection: None.  Minimal edematous stranding. Electronically Signed   By: Paulina FusiMark  Shogry M.D.   On: 12/14/2017 17:30    Procedures Dr. Cliffton AstersWhite (12/14/17) - Laparoscopic Appendectomy  Hospital Course:  Pt is a 47 year old HIV positive male with a history of DM who [facility]  Workup showed appendicitis.  Patient was admitted and underwent procedure listed above.  Tolerated procedure well and was transferred to the floor.  Diet was advanced as tolerated.  On POD#1, the patient was voiding well, tolerating diet, ambulating well, pain well controlled, vital signs stable, incisions c/d/i and felt stable for discharge home.  Patient will follow up in our office in 2 weeks and knows to call with questions or concerns.  He will call to confirm appointment date/time.    Patient was discharged in good condition.  The [place] Substance controlled database was reviewed prior to prescribing narcotic pain medication to this patient.  Physical Exam: General:  Alert, NAD, pleasant, cooperative Cardio: RRR, S1 & S2 normal, no murmur, rubs, gallops Resp: Effort normal, lungs CTA bilaterally, no wheezes, rales, rhonchi Abd:  Soft, ND, normal bowel sounds, mild TTP, incisions with glue intact and without drainage or surrounding erythema Skin: warm and dry,  no rashes noted  Allergies as of 12/15/2017      Reactions   Phenytoin Other (See Comments)   Childhood pt unsure   Metformin And Related Nausea And Vomiting      Medication List    TAKE these medications   aspirin 81 MG tablet Take 81 mg by mouth daily.   atorvastatin 10 MG tablet Commonly known as:  LIPITOR TAKE 1 TABLET BY MOUTH NIGHTLY   BD PEN NEEDLE NANO U/F 32G X 4 MM Misc Generic drug:  Insulin Pen Needle USE 1 NEEDLE TO INJECT 24 UNITS INTO THE SKIN DAILY.   buPROPion 300 MG 24 hr tablet Commonly known as:  WELLBUTRIN XL TAKE 1 TABLET BY MOUTH EVERY DAY   fenofibrate 160 MG tablet TAKE 1 TABLET (160 MG TOTAL) BY MOUTH DAILY.   fluticasone 50 MCG/ACT nasal spray Commonly known as:  FLONASE Place 2 sprays into both nostrils as needed for allergies.   GENVOYA 150-150-200-10 MG Tabs tablet Generic drug:  elvitegravir-cobicistat-emtricitabine-tenofovir TAKE ONE TABLET BY MOUTH ONCE DAILY WITH BREAKFAST. STORE IN ORIGINALCONTAINER AT ROOM TEMPERATURE.   glucose blood test strip 1 each by Other route 3 (three) times daily as needed. Use as directed.   ibuprofen 200 MG tablet Commonly known as:  ADVIL,MOTRIN Take 600 mg by mouth every 6 (six) hours as needed for moderate pain.   ICY HOT 5 % Ptch Generic drug:  Menthol Apply 1 patch topically as needed (knee pain).   INVOKANA 100 MG Tabs tablet Generic drug:  canagliflozin TAKE 1 TABLET (100 MG TOTAL) BY MOUTH DAILY BEFORE BREAKFAST.   lisinopril 5 MG tablet Commonly known as:  PRINIVIL,ZESTRIL TAKE 1 TABLET (5 MG TOTAL) BY MOUTH DAILY.   loratadine 10 MG tablet Commonly known as:  CLARITIN Take 10 mg by mouth daily.   magnesium oxide 400 MG tablet Commonly known as:  MAG-OX Take 400 mg by mouth daily.   multivitamin with minerals Tabs tablet Take 1 tablet by mouth daily.   ONETOUCH DELICA LANCETS 33G Misc by Does not apply route.   pantoprazole 40 MG tablet Commonly known as:  PROTONIX TAKE 1  TABLET (40 MG TOTAL) BY MOUTH 2 (TWO) TIMES DAILY.   PROBIOTIC DAILY PO Take by mouth.   tadalafil 20 MG tablet Commonly known as:  CIALIS Take 1/2 to 1 tablet as needed.   traMADol 50 MG tablet Commonly known as:  ULTRAM Take 1 tablet (50 mg total) by mouth every 6 (six) hours as needed (pain not controlled with ibuprofen and tylenol).   VICTOZA 18 MG/3ML Sopn Generic drug:  liraglutide INJECT 1.8 MG (0.3ML) INTO THE SKIN DAILY        Follow-up Information    [facility] [place]. Call.   Specialty:  General Surgery Why:  We are working on a follow up appointment for you. Please call our office to see when your appointment date and time is. You may rescheule if needed. Contact information: [address] [place] [address] [phone]          Signed: Durand Copa [facility] 12/15/2017, 9:19 AM Pager: [phone] Consults: [phone] Mon-Fri 7:00 am-4:30 pm Sat-Sun 7:00 am-11:30 am

## 2017-12-20 ENCOUNTER — Encounter: Payer: Self-pay | Admitting: Family Medicine

## 2017-12-20 ENCOUNTER — Other Ambulatory Visit: Payer: Self-pay

## 2017-12-20 ENCOUNTER — Ambulatory Visit: Payer: BLUE CROSS/BLUE SHIELD | Admitting: Family Medicine

## 2017-12-20 VITALS — BP 122/84 | HR 83 | Temp 98.4°F | Resp 16 | Ht 70.0 in | Wt 153.0 lb

## 2017-12-20 DIAGNOSIS — E119 Type 2 diabetes mellitus without complications: Secondary | ICD-10-CM

## 2017-12-20 DIAGNOSIS — E785 Hyperlipidemia, unspecified: Secondary | ICD-10-CM

## 2017-12-20 LAB — CBC WITH DIFFERENTIAL/PLATELET
BASOS ABS: 0 10*3/uL (ref 0.0–0.1)
Basophils Relative: 0.1 % (ref 0.0–3.0)
Eosinophils Absolute: 0.2 10*3/uL (ref 0.0–0.7)
Eosinophils Relative: 1.5 % (ref 0.0–5.0)
HEMATOCRIT: 51.5 % (ref 39.0–52.0)
Hemoglobin: 17.3 g/dL — ABNORMAL HIGH (ref 13.0–17.0)
LYMPHS ABS: 2.1 10*3/uL (ref 0.7–4.0)
LYMPHS PCT: 16.4 % (ref 12.0–46.0)
MCHC: 33.6 g/dL (ref 30.0–36.0)
MCV: 91.5 fl (ref 78.0–100.0)
MONOS PCT: 8.7 % (ref 3.0–12.0)
Monocytes Absolute: 1.1 10*3/uL — ABNORMAL HIGH (ref 0.1–1.0)
NEUTROS ABS: 9.6 10*3/uL — AB (ref 1.4–7.7)
NEUTROS PCT: 73.3 % (ref 43.0–77.0)
PLATELETS: 412 10*3/uL — AB (ref 150.0–400.0)
RBC: 5.63 Mil/uL (ref 4.22–5.81)
RDW: 12.8 % (ref 11.5–15.5)
WBC: 13.1 10*3/uL — ABNORMAL HIGH (ref 4.0–10.5)

## 2017-12-20 LAB — HEPATIC FUNCTION PANEL
ALK PHOS: 74 U/L (ref 39–117)
ALT: 27 U/L (ref 0–53)
AST: 17 U/L (ref 0–37)
Albumin: 4.6 g/dL (ref 3.5–5.2)
BILIRUBIN DIRECT: 0.1 mg/dL (ref 0.0–0.3)
BILIRUBIN TOTAL: 0.7 mg/dL (ref 0.2–1.2)
TOTAL PROTEIN: 6.8 g/dL (ref 6.0–8.3)

## 2017-12-20 LAB — LIPID PANEL
Cholesterol: 164 mg/dL (ref 0–200)
HDL: 40.9 mg/dL (ref >39.00)
NonHDL: 122.84
Total CHOL/HDL Ratio: 4
Triglycerides: 214 mg/dL — ABNORMAL HIGH (ref 0.0–149.0)
VLDL: 42.8 mg/dL — ABNORMAL HIGH (ref 0.0–40.0)

## 2017-12-20 LAB — BASIC METABOLIC PANEL
BUN: 23 mg/dL (ref 6–23)
CO2: 28 meq/L (ref 19–32)
Calcium: 8.8 mg/dL (ref 8.4–10.5)
Chloride: 101 mEq/L (ref 96–112)
Creatinine, Ser: 1.12 mg/dL (ref 0.40–1.50)
GFR: 74.42 mL/min (ref >60.00)
GLUCOSE: 182 mg/dL — AB (ref 70–99)
POTASSIUM: 4.2 meq/L (ref 3.5–5.1)
SODIUM: 138 meq/L (ref 135–145)

## 2017-12-20 LAB — HEMOGLOBIN A1C: HEMOGLOBIN A1C: 7.2 % — AB (ref 4.6–6.5)

## 2017-12-20 LAB — LDL CHOLESTEROL, DIRECT: Direct LDL: 92 mg/dL

## 2017-12-20 LAB — TSH: TSH: 2.38 u[IU]/mL (ref 0.35–4.50)

## 2017-12-20 MED ORDER — EMPAGLIFLOZIN 10 MG PO TABS: 10.0000 mg | ORAL_TABLET | Freq: Every day | ORAL | 1 refills | Status: DC

## 2017-12-20 NOTE — Assessment & Plan Note (Signed)
Chronic problem.  On Invokana but insurance is requesting switch back to [place].  Foot exam done today.  On ACE for renal protection.  Due for eye exam- pt to schedule.  Check labs.  Adjust meds prn

## 2017-12-20 NOTE — Progress Notes (Signed)
   Subjective:    Patient ID: Frederick Abbott, male    DOB: 09/30/1971, 47 y.o.   MRN: [id number]  HPI DM- chronic problem, currently on Victoza 1.8mg  daily and Invokana w/ hx of adequate control.  On ACE for renal protection.  UTD on foot exam.  Due for eye exam.  Pt reports home CBGs are 'pretty good'.  Some symptomatic lows.  No CP, SOB, HAs, visual changes.  No numbness/tingling of hands/feet.  Hyperlipidemia- chronic problem, on Lipitor 10mg  and Fenofibrate 160mg  daily.  No N/V.   Review of Systems For ROS see HPI     Objective:   Physical Exam  Constitutional: He is oriented to person, place, and time. He appears well-developed and well-nourished. No distress.  HENT:  Head: Normocephalic and atraumatic.  Eyes: Conjunctivae and EOM are normal. Pupils are equal, round, and reactive to light.  Neck: Normal range of motion. Neck supple. No thyromegaly present.  Cardiovascular: Normal rate, regular rhythm, normal heart sounds and intact distal pulses.  No murmur heard. Pulmonary/Chest: Effort normal and breath sounds normal. No respiratory distress.  Abdominal: Soft. Bowel sounds are normal. He exhibits distension (mild distension due to recent lap appe). There is tenderness (mild TTP due to recent appendectomy). There is no rebound and no guarding.  Musculoskeletal: He exhibits no edema.  Lymphadenopathy:    He has no cervical adenopathy.  Neurological: He is alert and oriented to person, place, and time. No cranial nerve deficit.  Skin: Skin is warm and dry.  Psychiatric: He has a normal mood and affect. His behavior is normal.  Vitals reviewed.         Assessment & Plan:

## 2017-12-20 NOTE — Patient Instructions (Signed)
Schedule your complete physical in 3-4 months We'll notify you of your lab results and make any changes if needed RESTART the Jardiance when you run out of the Invokana No other med changes at this time Please schedule your eye exam at your convenience Call with any questions or concerns TAKE IT EASY!!!

## 2017-12-20 NOTE — Assessment & Plan Note (Signed)
Chronic problem.  Tolerating statin and fenofibrate.  Stressed need for healthy diet and regular exercise.  Check labs.  Adjust meds prn

## 2017-12-26 ENCOUNTER — Other Ambulatory Visit (INDEPENDENT_AMBULATORY_CARE_PROVIDER_SITE_OTHER): Payer: BLUE CROSS/BLUE SHIELD

## 2017-12-26 DIAGNOSIS — D72829 Elevated white blood cell count, unspecified: Secondary | ICD-10-CM | POA: Diagnosis not present

## 2017-12-26 LAB — CBC WITH DIFFERENTIAL/PLATELET
Basophils Absolute: 0 10*3/uL (ref 0.0–0.1)
Basophils Relative: 0.4 % (ref 0.0–3.0)
EOS PCT: 5.6 % — AB (ref 0.0–5.0)
Eosinophils Absolute: 0.6 10*3/uL (ref 0.0–0.7)
HCT: 50.8 % (ref 39.0–52.0)
HEMOGLOBIN: 17.2 g/dL — AB (ref 13.0–17.0)
Lymphocytes Relative: 36.7 % (ref 12.0–46.0)
Lymphs Abs: 3.8 10*3/uL (ref 0.7–4.0)
MCHC: 33.9 g/dL (ref 30.0–36.0)
MCV: 92 fl (ref 78.0–100.0)
MONO ABS: 0.9 10*3/uL (ref 0.1–1.0)
Monocytes Relative: 8.2 % (ref 3.0–12.0)
Neutro Abs: 5.1 10*3/uL (ref 1.4–7.7)
Neutrophils Relative %: 49.1 % (ref 43.0–77.0)
Platelets: 335 10*3/uL (ref 150.0–400.0)
RBC: 5.52 Mil/uL (ref 4.22–5.81)
RDW: 13 % (ref 11.5–15.5)
WBC: 10.4 10*3/uL (ref 4.0–10.5)

## 2017-12-29 ENCOUNTER — Other Ambulatory Visit: Payer: Self-pay | Admitting: Family Medicine

## 2018-01-03 LAB — HM DIABETES EYE EXAM

## 2018-01-10 ENCOUNTER — Other Ambulatory Visit: Payer: Self-pay | Admitting: Internal Medicine

## 2018-01-10 DIAGNOSIS — B20 Human immunodeficiency virus [HIV] disease: Secondary | ICD-10-CM

## 2018-01-17 ENCOUNTER — Encounter: Payer: Self-pay | Admitting: General Practice

## 2018-01-31 ENCOUNTER — Other Ambulatory Visit: Payer: Self-pay | Admitting: Family Medicine

## 2018-02-09 ENCOUNTER — Other Ambulatory Visit: Payer: Self-pay | Admitting: General Practice

## 2018-02-09 MED ORDER — PANTOPRAZOLE SODIUM 40 MG PO TBEC: DELAYED_RELEASE_TABLET | ORAL | 1 refills | Status: DC

## 2018-02-28 ENCOUNTER — Other Ambulatory Visit: Payer: Self-pay | Admitting: Family Medicine

## 2018-03-31 ENCOUNTER — Other Ambulatory Visit: Payer: Self-pay | Admitting: Family Medicine

## 2018-04-07 ENCOUNTER — Other Ambulatory Visit: Payer: Self-pay | Admitting: Internal Medicine

## 2018-04-07 DIAGNOSIS — B20 Human immunodeficiency virus [HIV] disease: Secondary | ICD-10-CM

## 2018-04-10 ENCOUNTER — Encounter: Payer: Self-pay | Admitting: Family Medicine

## 2018-05-23 ENCOUNTER — Other Ambulatory Visit: Payer: Self-pay | Admitting: Family Medicine

## 2018-06-01 ENCOUNTER — Ambulatory Visit: Payer: Self-pay | Admitting: Medical

## 2018-06-01 VITALS — BP 121/80 | HR 81 | Temp 97.4°F

## 2018-06-01 DIAGNOSIS — R21 Rash and other nonspecific skin eruption: Secondary | ICD-10-CM

## 2018-06-01 NOTE — Patient Instructions (Signed)
OTC hydrocortisone cream twiwce dialy  X 7 days if worsening return to clinic, if not resolved return in  10 days. Rash A rash is a change in the color of the skin. A rash can also change the way your skin feels. There are many different conditions and factors that can cause a rash. Follow these instructions at home: Pay attention to any changes in your symptoms. Follow these instructions to help with your condition: Medicine Take or apply over-the-counter and prescription medicines only as told by your doctor. These may include:  Corticosteroid cream.  Anti-itch lotions.  Oral antihistamines.  Skin Care  Put cool compresses on the affected areas.  Try taking a bath with: ? Epsom salts. Follow the instructions on the packaging. You can get these at your local pharmacy or grocery store. ? Baking soda. Pour a small amount into the bath as told by your doctor. ? Colloidal oatmeal. Follow the instructions on the packaging. You can get this at your local pharmacy or grocery store.  Try putting baking soda paste onto your skin. Stir water into baking soda until it gets like a paste.  Do not scratch or rub your skin.  Avoid covering the rash. Make sure the rash is exposed to air as much as possible. General instructions  Avoid hot showers or baths, which can make itching worse. A cold shower may help.  Avoid scented soaps, detergents, and perfumes. Use gentle soaps, detergents, perfumes, and other cosmetic products.  Avoid anything that causes your rash. Keep a journal to help track what causes your rash. Write down: ? What you eat. ? What cosmetic products you use. ? What you drink. ? What you wear. This includes jewelry.  Keep all follow-up visits as told by your doctor. This is important. Contact a doctor if:  You sweat at night.  You lose weight.  You pee (urinate) more than normal.  You feel weak.  You throw up (vomit).  Your skin or the whites of your eyes look  yellow (jaundice).  Your skin: ? Tingles. ? Is numb.  Your rash: ? Does not go away after a few days. ? Gets worse.  You are: ? More thirsty than normal. ? More tired than normal.  You have: ? New symptoms. ? Pain in your belly (abdomen). ? A fever. ? Watery poop (diarrhea). Get help right away if:  Your rash covers all or most of your body. The rash may or may not be painful.  You have blisters that: ? Are on top of the rash. ? Grow larger. ? Grow together. ? Are painful. ? Are inside your nose or mouth.  You have a rash that: ? Looks like purple pinprick-sized spots all over your body. ? Has a "bull's eye" or looks like a target. ? Is red and painful, causes your skin to peel, and is not from being in the sun too long. This information is not intended to replace advice given to you by your health care provider. Make sure you discuss any questions you have with your health care provider. Document Released: 04/12/2008 Document Revised: 04/01/2016 Document Reviewed: 03/12/2015 Elsevier Interactive Patient Education  2018 [employer]

## 2018-06-01 NOTE — Progress Notes (Signed)
   Subjective:    Patient ID: Frederick Abbott, male    DOB: 03/31/1971, 47 y.o.   MRN: [id number]  HPI 47 yo in non acute distress. Presents today with complaints of rash on lower abdomen on the right side. Started  3 days ago. Does not itch and is not  Painful.   Review of Systems  Constitutional: Negative for chills and fever.  Musculoskeletal: Negative for myalgias.  Skin: Positive for rash.       Objective:   Physical Exam  Constitutional: He is oriented to person, place, and time. He appears well-developed and well-nourished.  Eyes: Pupils are equal, round, and reactive to light. Conjunctivae and EOM are normal.  Neck: Normal range of motion.  Neurological: He is alert and oriented to person, place, and time.  Skin: Skin is warm and dry.  Psychiatric: He has a normal mood and affect. His behavior is normal. Judgment and thought content normal.  Nursing note and vitals reviewed.   Picture by patient looked bigger and redder  Flat rash to right lower abdomen area does not apear to be fungal, macular eerythematous 2x1 cm oblong, no adenopathy    Assessment & Plan:  Rash- avoid heat the area. OTC Hydrocortisone cream twice daily x 7 days ( packets given to patient) , return if not improving, return if not resolved in 10 days , patient verbalizes understanding and has no questions at discharge.

## 2018-06-01 NOTE — Progress Notes (Signed)
Pt c/o rash to RLQ abd along his waistline. Reports it is smooth, red, non raised, nonpapular. Nonpainful, nonpuritic. He noticed it 3 days ago. Has not used anything OTC to treat.

## 2018-06-16 ENCOUNTER — Ambulatory Visit: Payer: Self-pay | Admitting: *Deleted

## 2018-06-16 VITALS — BP 126/87 | Ht 70.5 in | Wt 155.0 lb

## 2018-06-16 DIAGNOSIS — Z0000 Encounter for general adult medical examination without abnormal findings: Secondary | ICD-10-CM

## 2018-06-16 NOTE — Progress Notes (Signed)
[employer] insurance premium discount evaluation: Labs Drawn. Replacements ROI form signed. Tobacco Free Attestation form signed.  Forms placed in paper chart. Okay to route results to PCP Beverely Lowabori and ID MD Cliffton AstersJohn Campbell.

## 2018-06-17 ENCOUNTER — Other Ambulatory Visit: Payer: Self-pay | Admitting: Family Medicine

## 2018-06-17 LAB — CMP12+LP+TP+TSH+6AC+PSA+CBC…
ALBUMIN: 4.6 g/dL (ref 3.5–5.5)
ALT: 30 IU/L (ref 0–44)
AST: 24 IU/L (ref 0–40)
Albumin/Globulin Ratio: 2.2 (ref 1.2–2.2)
Alkaline Phosphatase: 82 IU/L (ref 39–117)
BUN/Creatinine Ratio: 15 (ref 9–20)
BUN: 17 mg/dL (ref 6–24)
Basophils Absolute: 0 10*3/uL (ref 0.0–0.2)
Basos: 1 %
Bilirubin Total: 0.5 mg/dL (ref 0.0–1.2)
CHLORIDE: 106 mmol/L (ref 96–106)
CHOLESTEROL TOTAL: 190 mg/dL (ref 100–199)
CREATININE: 1.1 mg/dL (ref 0.76–1.27)
Calcium: 9.6 mg/dL (ref 8.7–10.2)
Chol/HDL Ratio: 4.4 ratio (ref 0.0–5.0)
EOS (ABSOLUTE): 0.3 10*3/uL (ref 0.0–0.4)
ESTIMATED CHD RISK: 0.9 times avg. (ref 0.0–1.0)
Eos: 4 %
Free Thyroxine Index: 2 (ref 1.2–4.9)
GFR, EST AFRICAN AMERICAN: 91 mL/min/{1.73_m2} (ref >59)
GFR, EST NON AFRICAN AMERICAN: 79 mL/min/{1.73_m2} (ref >59)
GGT: 51 IU/L (ref 0–65)
GLUCOSE: 171 mg/dL — AB (ref 65–99)
Globulin, Total: 2.1 g/dL (ref 1.5–4.5)
HDL: 43 mg/dL (ref >39)
Hematocrit: 50 % (ref 37.5–51.0)
Hemoglobin: 17 g/dL (ref 13.0–17.7)
IRON: 102 ug/dL (ref 38–169)
Immature Grans (Abs): 0 10*3/uL (ref 0.0–0.1)
Immature Granulocytes: 0 %
LDH: 108 IU/L — ABNORMAL LOW (ref 121–224)
LDL Calculated: 101 mg/dL — ABNORMAL HIGH (ref 0–99)
LYMPHS ABS: 2.7 10*3/uL (ref 0.7–3.1)
Lymphs: 33 %
MCH: 30.6 pg (ref 26.6–33.0)
MCHC: 34 g/dL (ref 31.5–35.7)
MCV: 90 fL (ref 79–97)
MONOCYTES: 10 %
MONOS ABS: 0.8 10*3/uL (ref 0.1–0.9)
NEUTROS ABS: 4.3 10*3/uL (ref 1.4–7.0)
Neutrophils: 52 %
PHOSPHORUS: 3.1 mg/dL (ref 2.5–4.5)
POTASSIUM: 4.2 mmol/L (ref 3.5–5.2)
Platelets: 279 10*3/uL (ref 150–450)
Prostate Specific Ag, Serum: 0.7 ng/mL (ref 0.0–4.0)
RBC: 5.55 x10E6/uL (ref 4.14–5.80)
RDW: 13.5 % (ref 12.3–15.4)
Sodium: 141 mmol/L (ref 134–144)
T3 UPTAKE RATIO: 26 % (ref 24–39)
T4 TOTAL: 7.5 ug/dL (ref 4.5–12.0)
TOTAL PROTEIN: 6.7 g/dL (ref 6.0–8.5)
TSH: 2.61 u[IU]/mL (ref 0.450–4.500)
Triglycerides: 230 mg/dL — ABNORMAL HIGH (ref 0–149)
URIC ACID: 4 mg/dL (ref 3.7–8.6)
VLDL Cholesterol Cal: 46 mg/dL — ABNORMAL HIGH (ref 5–40)
WBC: 8.2 10*3/uL (ref 3.4–10.8)

## 2018-06-17 LAB — HGB A1C W/O EAG: Hgb A1c MFr Bld: 7.6 % — ABNORMAL HIGH (ref 4.8–5.6)

## 2018-06-19 NOTE — Telephone Encounter (Signed)
Pt last seen in February, advised to schedule a CPE in 3-4 months. Please advise?

## 2018-06-19 NOTE — Telephone Encounter (Signed)
Ok for 30 day supply of each and no refills w/o appt.  At minimum, needs diabetes follow up.

## 2018-06-23 ENCOUNTER — Ambulatory Visit: Payer: Self-pay | Admitting: *Deleted

## 2018-06-23 VITALS — BP 114/84 | HR 98

## 2018-06-23 DIAGNOSIS — F419 Anxiety disorder, unspecified: Secondary | ICD-10-CM

## 2018-06-23 DIAGNOSIS — R42 Dizziness and giddiness: Secondary | ICD-10-CM

## 2018-06-23 DIAGNOSIS — R002 Palpitations: Secondary | ICD-10-CM

## 2018-06-26 NOTE — Progress Notes (Signed)
Late entry: Pt entered RN office 06/23/18 at 1230 c/o one episode n/v, then onset of lightheadedness, palpitations/sensation of heart racing, diaphoresis, tremors/shaking.   N/V episodes have been sporadic, every few months, over past couple of years. Comes on suddenly. Usually feels better right after vomiting. He has discussed this with his pcp and been given possible Dx's but has not f/u recently.   BP, pulse, spO2 all WNL. Pulse regular, strong. Denies ShOB, chest pain, jaw pain, arm or any other radiating pain. Only feeling like heart is racing.   He reports checking his sugar shortly before reporting to RN clinic and that is was normal. Sts that he has only had a caffeinated drink and some crackers this morning. Last night had caffeinated coffee. Sts caffeine does not usually bother or affect him.   Endorses Hx of anxiety but denies sx this severe in the past and reports he was not particularly anxious at onset of sx.   Sx improved some while sitting in clinic and talking with RN. Discussed with pt that sx could be cardiac or pulmonary related, or anxiety or caffeine induced, or a combination of any of these. However, unable to confirm or r/o without EKG which cannot be performed in clinic. Pt verbalizes understanding and sts he does not want to go to the ED for an EKG. Sts he just wanted to have vitals checked and now that he sees that they are normal, that helps him feel better. Advised pt of risks of delaying care if cardiac or pulmonary including irreversible damage to heart, blood clots, heart rhythm abnormalities, death. He verbalizes understanding.   Based on improvement of sx while talking through sx and VS check, and Hx of anxiety, RN feels sx more likely to be related to anxiety. Pt reports that he is going out to lunch with a coworker shortly. Coworker driving. Advised pt he could try to eat something light, but high in protein and nutrients, avoid fried foods or sugar. Recheck cbg prior  to and after eating to ensure still within his normal range. Advised to return to clinic this afternoon if any sx persists or worsen after lunch.   06/26/18: Pt did not return to clinic after lunch. Confirmed with supervisor on 8/19 that he did take lunch 1300-1415, returned to work and worked until 2000. He is off today which was previously scheduled. Supervisor reports that employee did not report any issues or concerns to supervisor upon return to work. No PCP, UC, or ED visits visible in Epic over the weekend.

## 2018-07-12 ENCOUNTER — Other Ambulatory Visit: Payer: Self-pay | Admitting: Family Medicine

## 2018-07-17 ENCOUNTER — Encounter: Payer: Self-pay | Admitting: Family Medicine

## 2018-07-21 ENCOUNTER — Ambulatory Visit: Payer: BLUE CROSS/BLUE SHIELD | Admitting: Family Medicine

## 2018-07-21 ENCOUNTER — Encounter: Payer: Self-pay | Admitting: Family Medicine

## 2018-07-21 ENCOUNTER — Other Ambulatory Visit: Payer: Self-pay

## 2018-07-21 VITALS — BP 118/72 | HR 76 | Temp 98.2°F | Resp 16 | Ht 71.0 in | Wt 156.0 lb

## 2018-07-21 DIAGNOSIS — E785 Hyperlipidemia, unspecified: Secondary | ICD-10-CM | POA: Diagnosis not present

## 2018-07-21 DIAGNOSIS — E119 Type 2 diabetes mellitus without complications: Secondary | ICD-10-CM

## 2018-07-21 DIAGNOSIS — B20 Human immunodeficiency virus [HIV] disease: Secondary | ICD-10-CM | POA: Diagnosis not present

## 2018-07-21 MED ORDER — TADALAFIL 20 MG PO TABS: ORAL_TABLET | ORAL | 1 refills | Status: DC

## 2018-07-21 NOTE — Progress Notes (Signed)
   Subjective:    Patient ID: Frederick Abbott, male    DOB: 05/13/1971, 47 y.o.   MRN: [id number]  HPI DM- chronic problem, on Jardiance 10mg  daily and Victoza 1.8mg .  On ACE for renal protection.  UTD on eye exam, foot exam.  Most recent A1C 7.6 (06/16/18).  Denies symptomatic lows, numbness/tingling of hands/feet.  No CP, SOB, HAs, visual changes.  Hyperlipidemia- chronic problem, on Fenofibrate 160mg  daily and Lipitor 10mg  daily w/ good control.  LDL 101.  Denies abd pain, N/V, myalgias.   Review of Systems For ROS see HPI     Objective:   Physical Exam  Constitutional: He is oriented to person, place, and time. He appears well-developed and well-nourished. No distress.  HENT:  Head: Normocephalic and atraumatic.  Eyes: Pupils are equal, round, and reactive to light. Conjunctivae and EOM are normal.  Neck: Normal range of motion. Neck supple. No thyromegaly present.  Cardiovascular: Normal rate, regular rhythm, normal heart sounds and intact distal pulses.  No murmur heard. Pulmonary/Chest: Effort normal and breath sounds normal. No respiratory distress.  Abdominal: Soft. Bowel sounds are normal. He exhibits no distension.  Musculoskeletal: He exhibits no edema.  Lymphadenopathy:    He has no cervical adenopathy.  Neurological: He is alert and oriented to person, place, and time. No cranial nerve deficit.  Skin: Skin is warm and dry.  Psychiatric: He has a normal mood and affect. His behavior is normal.  Vitals reviewed.         Assessment & Plan:

## 2018-07-21 NOTE — Assessment & Plan Note (Signed)
Chronic problem.  Recent A1C 7.6.  UTD on foot exam, eye exam, on ACE for renal protection.  Will continue to follow.

## 2018-07-21 NOTE — Assessment & Plan Note (Signed)
Chronic problem. On Fenofibrate and Lipitor daily w/o difficulty.  Check labs.  Adjust meds prn

## 2018-07-21 NOTE — Patient Instructions (Signed)
Follow up in as needed or as scheduled Your labs look great!  Keep up the good work! Call with any questions or concerns Happy Fall!!!

## 2018-07-21 NOTE — Assessment & Plan Note (Signed)
Chronic problem, following w/ Dr Campbell 

## 2018-07-24 ENCOUNTER — Other Ambulatory Visit: Payer: Self-pay | Admitting: Family Medicine

## 2018-08-01 ENCOUNTER — Encounter: Payer: Self-pay | Admitting: Family Medicine

## 2018-08-02 ENCOUNTER — Other Ambulatory Visit: Payer: Self-pay | Admitting: General Practice

## 2018-08-02 ENCOUNTER — Other Ambulatory Visit: Payer: Self-pay | Admitting: Family Medicine

## 2018-08-02 MED ORDER — EMPAGLIFLOZIN 10 MG PO TABS: 10.0000 mg | ORAL_TABLET | Freq: Every day | ORAL | 0 refills | Status: DC

## 2018-08-07 ENCOUNTER — Other Ambulatory Visit: Payer: Self-pay | Admitting: Family Medicine

## 2018-08-10 ENCOUNTER — Other Ambulatory Visit: Payer: Self-pay | Admitting: Family Medicine

## 2018-08-29 ENCOUNTER — Encounter: Payer: Self-pay | Admitting: Registered Nurse

## 2018-08-29 ENCOUNTER — Ambulatory Visit: Payer: Self-pay | Admitting: Registered Nurse

## 2018-08-29 VITALS — BP 114/81 | HR 83 | Temp 98.1°F

## 2018-08-29 DIAGNOSIS — J029 Acute pharyngitis, unspecified: Secondary | ICD-10-CM

## 2018-08-29 DIAGNOSIS — J019 Acute sinusitis, unspecified: Secondary | ICD-10-CM

## 2018-08-29 MED ORDER — OSELTAMIVIR PHOSPHATE 75 MG PO CAPS: 75.0000 mg | ORAL_CAPSULE | Freq: Two times a day (BID) | ORAL | 0 refills | Status: AC

## 2018-08-29 MED ORDER — SALINE SPRAY 0.65 % NA SOLN: 2.0000 | NASAL | 0 refills | Status: DC

## 2018-08-29 MED ORDER — ALBUTEROL SULFATE HFA 108 (90 BASE) MCG/ACT IN AERS: 1.0000 | INHALATION_SPRAY | RESPIRATORY_TRACT | 0 refills | Status: DC | PRN

## 2018-08-29 MED ORDER — AMOXICILLIN 875 MG PO TABS: 875.0000 mg | ORAL_TABLET | Freq: Two times a day (BID) | ORAL | 0 refills | Status: AC

## 2018-08-29 MED ORDER — BENZONATATE 200 MG PO CAPS: 200.0000 mg | ORAL_CAPSULE | Freq: Three times a day (TID) | ORAL | 0 refills | Status: AC | PRN

## 2018-08-29 NOTE — Patient Instructions (Addendum)
Sinus Rinse What is a sinus rinse? A sinus rinse is a simple home treatment that is used to rinse your sinuses with a sterile mixture of salt and water (saline solution). Sinuses are air-filled spaces in your skull behind the bones of your face and forehead that open into your nasal cavity. You will use the following:  Saline solution.  Neti pot or spray bottle. This releases the saline solution into your nose and through your sinuses. Neti pots and spray bottles can be purchased at [employer], a health food store, or online.  When would I do a sinus rinse? A sinus rinse can help to clear mucus, dirt, dust, or pollen from the nasal cavity. You may do a sinus rinse when you have a cold, a virus, nasal allergy symptoms, a sinus infection, or stuffiness in the nose or sinuses. If you are considering a sinus rinse:  Ask your child's health care provider before performing a sinus rinse on your child.  Do not do a sinus rinse if you have had ear or nasal surgery, ear infection, or blocked ears.  How do I do a sinus rinse?  Wash your hands.  Disinfect your device according to the directions provided and then dry it.  Use the solution that comes with your device or one that is sold separately in stores. Follow the mixing directions on the package.  Fill your device with the amount of saline solution as directed by the device instructions.  Stand over a sink and tilt your head sideways over the sink.  Place the spout of the device in your upper nostril (the one closer to the ceiling).  Gently pour or squeeze the saline solution into the nasal cavity. The liquid should drain to the lower nostril if you are not overly congested.  Gently blow your nose. Blowing too hard may cause ear pain.  Repeat in the other nostril.  Clean and rinse your device with clean water and then air-dry it. Are there risks of a sinus rinse? Sinus rinse is generally very safe and effective. However,  there are a few risks, which include:  A burning sensation in the sinuses. This may happen if you do not make the saline solution as directed. Make sure to follow all directions when making the saline solution.  Infection from contaminated water. This is rare, but possible.  Nasal irritation.  This information is not intended to replace advice given to you by your health care provider. Make sure you discuss any questions you have with your health care provider. Document Released: 05/22/2014 Document Revised: 09/21/2016 Document Reviewed: 03/12/2014 Elsevier Interactive Patient Education  2017 [employer] Sinusitis, Adult Sinusitis is soreness and inflammation of your sinuses. Sinuses are hollow spaces in the bones around your face. Your sinuses are located:  Around your eyes.  In the middle of your forehead.  Behind your nose.  In your cheekbones.  Your sinuses and nasal passages are lined with a stringy fluid (mucus). Mucus normally drains out of your sinuses. When your nasal tissues become inflamed or swollen, the mucus can become trapped or blocked so air cannot flow through your sinuses. This allows bacteria, viruses, and funguses to grow, which leads to infection. Sinusitis can develop quickly and last for 7?10 days (acute) or for more than 12 weeks (chronic). Sinusitis often develops after a cold. What are the causes? This condition is caused by anything that creates swelling in the sinuses or stops mucus from draining, including:  Allergies.  Asthma.  Bacterial or viral infection.  Abnormally shaped bones between the nasal passages.  Nasal growths that contain mucus (nasal polyps).  Narrow sinus openings.  Pollutants, such as chemicals or irritants in the air.  A foreign object stuck in the nose.  A fungal infection. This is rare.  What increases the risk? The following factors may make you more likely to develop this condition:  Having allergies or  asthma.  Having had a recent cold or respiratory tract infection.  Having structural deformities or blockages in your nose or sinuses.  Having a weak immune system.  Doing a lot of swimming or diving.  Overusing nasal sprays.  Smoking.  What are the signs or symptoms? The main symptoms of this condition are pain and a feeling of pressure around the affected sinuses. Other symptoms include:  Upper toothache.  Earache.  Headache.  Bad breath.  Decreased sense of smell and taste.  A cough that may get worse at night.  Fatigue.  Fever.  Thick drainage from your nose. The drainage is often green and it may contain pus (purulent).  Stuffy nose or congestion.  Postnasal drip. This is when extra mucus collects in the throat or back of the nose.  Swelling and warmth over the affected sinuses.  Sore throat.  Sensitivity to light.  How is this diagnosed? This condition is diagnosed based on symptoms, a medical history, and a physical exam. To find out if your condition is acute or chronic, your health care provider may:  Look in your nose for signs of nasal polyps.  Tap over the affected sinus to check for signs of infection.  View the inside of your sinuses using an imaging device that has a light attached (endoscope).  If your health care provider suspects that you have chronic sinusitis, you may also:  Be tested for allergies.  Have a sample of mucus taken from your nose (nasal culture) and checked for bacteria.  Have a mucus sample examined to see if your sinusitis is related to an allergy.  If your sinusitis does not respond to treatment and it lasts longer than 8 weeks, you may have an MRI or CT scan to check your sinuses. These scans also help to determine how severe your infection is. In rare cases, a bone biopsy may be done to rule out more serious types of fungal sinus disease. How is this treated? Treatment for sinusitis depends on the cause and  whether your condition is chronic or acute. If a virus is causing your sinusitis, your symptoms will go away on their own within 10 days. You may be given medicines to relieve your symptoms, including:  Topical nasal decongestants. They shrink swollen nasal passages and let mucus drain from your sinuses.  Antihistamines. These drugs block inflammation that is triggered by allergies. This can help to ease swelling in your nose and sinuses.  Topical nasal corticosteroids. These are nasal sprays that ease inflammation and swelling in your nose and sinuses.  Nasal saline washes. These rinses can help to get rid of thick mucus in your nose.  If your condition is caused by bacteria, you will be given an antibiotic medicine. If your condition is caused by a fungus, you will be given an antifungal medicine. Surgery may be needed to correct underlying conditions, such as narrow nasal passages. Surgery may also be needed to remove polyps. Follow these instructions at home: Medicines  Take, use, or apply over-the-counter and prescription medicines only as told by   your health care provider. These may include nasal sprays.  If you were prescribed an antibiotic medicine, take it as told by your health care provider. Do not stop taking the antibiotic even if you start to feel better. Hydrate and Humidify  Drink enough water to keep your urine clear or pale yellow. Staying hydrated will help to thin your mucus.  Use a cool mist humidifier to keep the humidity level in your home above 50%.  Inhale steam for 10-15 minutes, 3-4 times a day or as told by your health care provider. You can do this in the bathroom while a hot shower is running.  Limit your exposure to cool or dry air. Rest  Rest as much as possible.  Sleep with your head raised (elevated).  Make sure to get enough sleep each night. General instructions  Apply a warm, moist washcloth to your face 3-4 times a day or as told by your  health care provider. This will help with discomfort.  Wash your hands often with soap and water to reduce your exposure to viruses and other germs. If soap and water are not available, use hand sanitizer.  Do not smoke. Avoid being around people who are smoking (secondhand smoke).  Keep all follow-up visits as told by your health care provider. This is important. Contact a health care provider if:  You have a fever.  Your symptoms get worse.  Your symptoms do not improve within 10 days. Get help right away if:  You have a severe headache.  You have persistent vomiting.  You have pain or swelling around your face or eyes.  You have vision problems.  You develop confusion.  Your neck is stiff.  You have trouble breathing. This information is not intended to replace advice given to you by your health care provider. Make sure you discuss any questions you have with your health care provider. Document Released: 10/25/2005 Document Revised: 06/20/2016 Document Reviewed: 08/20/2015 Elsevier Interactive Patient Education  2018 [employer] Pharyngitis Pharyngitis is redness, pain, and swelling (inflammation) of the throat (pharynx). It is a very common cause of sore throat. Pharyngitis can be caused by a bacteria, but it is usually caused by a virus. Most cases of pharyngitis get better on their own without treatment. What are the causes? This condition may be caused by:  Infection by viruses (viral). Viral pharyngitis spreads from person to person (is contagious) through coughing, sneezing, and sharing of personal items or utensils such as cups, forks, spoons, and toothbrushes.  Infection by bacteria (bacterial). Bacterial pharyngitis may be spread by touching the nose or face after coming in contact with the bacteria, or through more intimate contact, such as kissing.  Allergies. Allergies can cause buildup of mucus in the throat (post-nasal drip), leading to inflammation and  irritation. Allergies can also cause blocked nasal passages, forcing breathing through the mouth, which dries and irritates the throat.  What increases the risk? You are more likely to develop this condition if:  You are 63-32 years old.  You are exposed to crowded environments such as daycare, school, or dormitory living.  You live in a cold climate.  You have a weakened disease-fighting (immune) system.  What are the signs or symptoms? Symptoms of this condition vary by the cause (viral, bacterial, or allergies) and can include:  Sore throat.  Fatigue.  Low-grade fever.  Headache.  Joint pain and muscle aches.  Skin rashes.  Swollen glands in the throat (lymph nodes).  Plaque-like film  on the throat or tonsils. This is often a symptom of bacterial pharyngitis.  Vomiting.  Stuffy nose (nasal congestion).  Cough.  Red, itchy eyes (conjunctivitis).  Loss of appetite.  How is this diagnosed? This condition is often diagnosed based on your medical history and a physical exam. Your health care provider will ask you questions about your illness and your symptoms. A swab of your throat may be done to check for bacteria (rapid strep test). Other lab tests may also be done, depending on the suspected cause, but these are rare. How is this treated? This condition usually gets better in 3-4 days without medicine. Bacterial pharyngitis may be treated with antibiotic medicines. Follow these instructions at home:  Take over-the-counter and prescription medicines only as told by your health care provider. ? If you were prescribed an antibiotic medicine, take it as told by your health care provider. Do not stop taking the antibiotic even if you start to feel better. ? Do not give children aspirin because of the association with Reye syndrome.  Drink enough water and fluids to keep your urine clear or pale yellow.  Get a lot of rest.  Gargle with a salt-water mixture 3-4 times  a day or as needed. To make a salt-water mixture, completely dissolve -1 tsp of salt in 1 cup of warm water.  If your health care provider approves, you may use throat lozenges or sprays to soothe your throat. Contact a health care provider if:  You have large, tender lumps in your neck.  You have a rash.  You cough up green, yellow-brown, or bloody spit. Get help right away if:  Your neck becomes stiff.  You drool or are unable to swallow liquids.  You cannot drink or take medicines without vomiting.  You have severe pain that does not go away, even after you take medicine.  You have trouble breathing, and it is not caused by a stuffy nose.  You have new pain and swelling in your joints such as the knees, ankles, wrists, or elbows. Summary  Pharyngitis is redness, pain, and swelling (inflammation) of the throat (pharynx).  While pharyngitis can be caused by a bacteria, the most common causes are viral.  Most cases of pharyngitis get better on their own without treatment.  Bacterial pharyngitis is treated with antibiotic medicines. This information is not intended to replace advice given to you by your health care provider. Make sure you discuss any questions you have with your health care provider. Document Released: 10/25/2005 Document Revised: 11/30/2016 Document Reviewed: 11/30/2016 Elsevier Interactive Patient Education  2018 [employer].  Influenza, Adult Influenza, more commonly known as "the flu," is a viral infection that primarily affects the respiratory tract. The respiratory tract includes organs that help you breathe, such as the lungs, nose, and throat. The flu causes many common cold symptoms, as well as a high fever and body aches. The flu spreads easily from person to person (is contagious). Getting a flu shot (influenza vaccination) every year is the best way to prevent influenza. What are the causes? Influenza is caused by a virus. You can catch the virus  by:  Breathing in droplets from an infected person's cough or sneeze.  Touching something that was recently contaminated with the virus and then touching your mouth, nose, or eyes.  What increases the risk? The following factors may make you more likely to get the flu:  Not cleaning your hands frequently with soap and water or alcohol-based hand sanitizer.  Having close contact with many people during cold and flu season.  Touching your mouth, eyes, or nose without washing or sanitizing your hands first.  Not drinking enough fluids or not eating a healthy diet.  Not getting enough sleep or exercise.  Being under a high amount of stress.  Not getting a yearly (annual) flu shot.  You may be at a higher risk of complications from the flu, such as a severe lung infection (pneumonia), if you:  Are over the age of 32.  Are pregnant.  Have a weakened disease-fighting system (immune system). You may have a weakened immune system if you: ? Have HIV or AIDS. ? Are undergoing chemotherapy. ? Aretaking medicines that reduce the activity of (suppress) the immune system.  Have a long-term (chronic) illness, such as heart disease, kidney disease, diabetes, or lung disease.  Have a liver disorder.  Are obese.  Have anemia.  What are the signs or symptoms? Symptoms of this condition typically last 4-10 days and may include:  Fever.  Chills.  Headache, body aches, or muscle aches.  Sore throat.  Cough.  Runny or congested nose.  Chest discomfort and cough.  Poor appetite.  Weakness or tiredness (fatigue).  Dizziness.  Nausea or vomiting.  How is this diagnosed? This condition may be diagnosed based on your medical history and a physical exam. Your health care provider may do a nose or throat swab test to confirm the diagnosis. How is this treated? If influenza is detected early, you can be treated with antiviral medicine that can reduce the length of your illness  and the severity of your symptoms. This medicine may be given by mouth (orally) or through an IV tube that is inserted in one of your veins. The goal of treatment is to relieve symptoms by taking care of yourself at home. This may include taking over-the-counter medicines, drinking plenty of fluids, and adding humidity to the air in your home. In some cases, influenza goes away on its own. Severe influenza or complications from influenza may be treated in a hospital. Follow these instructions at home:  Take over-the-counter and prescription medicines only as told by your health care provider.  Use a cool mist humidifier to add humidity to the air in your home. This can make breathing easier.  Rest as needed.  Drink enough fluid to keep your urine clear or pale yellow.  Cover your mouth and nose when you cough or sneeze.  Wash your hands with soap and water often, especially after you cough or sneeze. If soap and water are not available, use hand sanitizer.  Stay home from work or school as told by your health care provider. Unless you are visiting your health care provider, try to avoid leaving home until your fever has been gone for 24 hours without the use of medicine.  Keep all follow-up visits as told by your health care provider. This is important. How is this prevented?  Getting an annual flu shot is the best way to avoid getting the flu. You may get the flu shot in late summer, fall, or winter. Ask your health care provider when you should get your flu shot.  Wash your hands often or use hand sanitizer often.  Avoid contact with people who are sick during cold and flu season.  Eat a healthy diet, drink plenty of fluids, get enough sleep, and exercise regularly. Contact a health care provider if:  You develop new symptoms.  You have: ? Chest  pain. ? Diarrhea. ? A fever.  Your cough gets worse.  You produce more mucus.  You feel nauseous or you vomit. Get help right  away if:  You develop shortness of breath or difficulty breathing.  Your skin or nails turn a bluish color.  You have severe pain or stiffness in your neck.  You develop a sudden headache or sudden pain in your face or ear.  You cannot stop vomiting. This information is not intended to replace advice given to you by your health care provider. Make sure you discuss any questions you have with your health care provider. Document Released: 10/22/2000 Document Revised: 04/01/2016 Document Reviewed: 08/19/2015 Elsevier Interactive Patient Education  2017 [employer].

## 2018-08-29 NOTE — Progress Notes (Signed)
Subjective:    Patient ID: Frederick Abbott, male    DOB: [birth year], 47 y.o.   MRN: [id number]  47y/o established Caucasian male pt c/o sinusitis, nasal and chest congestion, intermittently productive cough, PND for the past week. He has been treating at home with Mucinex and Flonase with some relief, but sx persists. Denies otalgia. Reports temp 99.x at home last night that he sts is always a low grade fever for him when temp is >99.  PMHx diabetes, HIV  One time this week prelunch glucose testing 200+ typically less than 140.  His sore throat doesn't feel like strep throat he has had in the past "not as severe and no white spots."     Review of Systems  Constitutional: Positive for fatigue and fever. Negative for activity change, appetite change, chills, diaphoresis and unexpected weight change.  HENT: Positive for congestion, postnasal drip, rhinorrhea, sinus pressure, sinus pain and sore throat. Negative for dental problem, drooling, ear discharge, ear pain, facial swelling, hearing loss, mouth sores, nosebleeds, sneezing, tinnitus, trouble swallowing and voice change.   Eyes: Negative for photophobia, pain, discharge, redness, itching and visual disturbance.  Respiratory: Positive for cough. Negative for choking, chest tightness, shortness of breath, wheezing and stridor.   Cardiovascular: Negative for chest pain, palpitations and leg swelling.  Gastrointestinal: Negative for abdominal distention, abdominal pain, diarrhea, nausea and vomiting.  Endocrine: Negative for cold intolerance and heat intolerance.  Genitourinary: Negative for dysuria.  Musculoskeletal: Negative for arthralgias, back pain, gait problem, joint swelling, myalgias, neck pain and neck stiffness.  Skin: Negative for color change, pallor, rash and wound.  Allergic/Immunologic: Positive for environmental allergies. Negative for food allergies and immunocompromised state.  Neurological: Negative for dizziness, tremors,  seizures, syncope, facial asymmetry, speech difficulty, weakness, light-headedness, numbness and headaches.  Hematological: Negative for adenopathy. Does not bruise/bleed easily.  Psychiatric/Behavioral: Negative for agitation, behavioral problems, confusion and sleep disturbance.       Objective:   Physical Exam  Constitutional: He is oriented to person, place, and time. Vital signs are normal. He appears well-developed. He appears cachectic. He is active and cooperative.  Non-toxic appearance. He does not have a sickly appearance. He does not appear ill. No distress.  HENT:  Head: Normocephalic and atraumatic.  Right Ear: Hearing, external ear and ear canal normal. A middle ear effusion is present.  Left Ear: Hearing, external ear and ear canal normal. A middle ear effusion is present.  Nose: Mucosal edema and rhinorrhea present. No nose lacerations, sinus tenderness, nasal deformity, septal deviation or nasal septal hematoma. No epistaxis.  No foreign bodies. Right sinus exhibits no maxillary sinus tenderness and no frontal sinus tenderness. Left sinus exhibits no maxillary sinus tenderness and no frontal sinus tenderness.  Mouth/Throat: Uvula is midline and mucous membranes are normal. Mucous membranes are not pale, not dry and not cyanotic. He does not have dentures. No oral lesions. No trismus in the jaw. Normal dentition. No dental abscesses, uvula swelling, lacerations or dental caries. Posterior oropharyngeal edema and posterior oropharyngeal erythema present. No oropharyngeal exudate or tonsillar abscesses. Tonsils are 1+ on the right. Tonsils are 1+ on the left. No tonsillar exudate.  cobblesotning posterior pharynx; erythema oropharynx; bilateral TMs air fluid level clear; bilateral allergic shiners; nasal turbinates edema/erythema clear discharge  Eyes: Pupils are equal, round, and reactive to light. Conjunctivae, EOM and lids are normal. Right eye exhibits no chemosis, no discharge, no  exudate and no hordeolum. No foreign body present in the right  eye. Left eye exhibits no chemosis, no discharge, no exudate and no hordeolum. No foreign body present in the left eye. Right conjunctiva is not injected. Right conjunctiva has no hemorrhage. Left conjunctiva is not injected. Left conjunctiva has no hemorrhage. No scleral icterus. Right eye exhibits normal extraocular motion and no nystagmus. Left eye exhibits normal extraocular motion and no nystagmus. Right pupil is round and reactive. Left pupil is round and reactive. Pupils are equal.  Neck: Trachea normal and normal range of motion. Neck supple. No tracheal tenderness, no spinous process tenderness and no muscular tenderness present. No neck rigidity. No tracheal deviation, no edema, no erythema and normal range of motion present. No thyroid mass and no thyromegaly present.  Cardiovascular: Normal rate, regular rhythm, S1 normal, S2 normal, normal heart sounds and intact distal pulses. PMI is not displaced. Exam reveals no gallop and no friction rub.  No murmur heard. Pulmonary/Chest: Effort normal and breath sounds normal. No stridor. No respiratory distress. He has no decreased breath sounds. He has no wheezes. He has no rhonchi. He has no rales.  Frequent throat clearing but no cough observed in exam room; spoke full sentences without difficulty  Abdominal: Soft. He exhibits no distension.  Musculoskeletal: Normal range of motion. He exhibits no edema or tenderness.       Right shoulder: Normal.       Left shoulder: Normal.       Right elbow: Normal.      Left elbow: Normal.       Right hip: Normal.       Left hip: Normal.       Right knee: Normal.       Left knee: Normal.       Cervical back: Normal.       Thoracic back: Normal.       Lumbar back: Normal.       Right hand: Normal.       Left hand: Normal.  Lymphadenopathy:       Head (right side): No submental, no submandibular, no tonsillar, no preauricular, no posterior  auricular and no occipital adenopathy present.       Head (left side): No submental, no submandibular, no tonsillar, no preauricular, no posterior auricular and no occipital adenopathy present.    He has no cervical adenopathy.       Right cervical: No superficial cervical, no deep cervical and no posterior cervical adenopathy present.      Left cervical: No superficial cervical, no deep cervical and no posterior cervical adenopathy present.  Neurological: He is alert and oriented to person, place, and time. He displays no atrophy and no tremor. No cranial nerve deficit or sensory deficit. He exhibits normal muscle tone. He displays no seizure activity. Coordination and gait normal. GCS eye subscore is 4. GCS verbal subscore is 5. GCS motor subscore is 6.  Skin: Skin is warm, dry and intact. No abrasion, no bruising, no burn, no ecchymosis, no laceration, no lesion, no petechiae and no rash noted. He is not diaphoretic. No cyanosis or erythema. No pallor. Nails show no clubbing.  Psychiatric: He has a normal mood and affect. His speech is normal and behavior is normal. Judgment and thought content normal. Cognition and memory are normal.  Nursing note and vitals reviewed.         Assessment & Plan:  A-acute rhinosinusitis and pharyngitis  P-Electronic Rx to his pharmacy of choice tessalon pearles 200mg  po TID prn cough #30 RF0.  Cough  lozenges po q2h prn cough given 8 UD from clinic stock.  Albuterol MDI 1-2 puffs po q4-6h prn protracted cough/wheeze #1 RF0 side effect increased heart rate. Bronchitis simple, community acquired, may have started as viral (probably respiratory syncytial, parainfluenza, influenza, or adenovirus), but now evidence of acute purulent bronchitis with resultant bronchial edema and mucus formation.  Viruses are the most common cause of bronchial inflammation in otherwise healthy adults with acute bronchitis.  The appearance of sputum is not predictive of whether a  bacterial infection is present.  Purulent sputum is most often caused by viral infections.  There are a small portion of those caused by non-viral agents being Mycoplama pneumonia.  Microscopic examination or C&S of sputum in the healthy adult with acute bronchitis is generally not helpful (usually negative or normal respiratory flora) other considerations being cough from upper respiratory tract infections, sinusitis or allergic syndromes (mild asthma or viral pneumonia).  Differential Diagnoses:  reactive airway disease (asthma, allergic aspergillosis (eosinophilia), chronic bronchitis, respiratory infection (sinusitis, common cold, pneumonia), congestive heart failure, reflux esophagitis, bronchogenic tumor, aspiration syndromes and/or exposure to pulmonary irritants/smoke.  Without high fever, severe dyspnea, lack of physical findings or other risk factors, I will hold on a chest radiograph and CBC at this time.  I discussed that approximately 50% of patients with acute bronchitis have a cough that lasts up to three weeks, and 25% for over a month.  Tylenol 500mg  one to two tablets every four to six hours as needed for fever or myalgias.  No aspirin. Exitcare handout on bronchitis and inhaler use.  ER if hemopthysis, SOB, worst chest pain of life.   Patient instructed to follow up in one week or sooner if symptoms worsen.  Patient verbalized agreement and understanding of treatment plan.  P2:  hand washing and cover cough   Usually no specific medical treatment is needed if a virus is causing the sore throat. The throat most often gets better on its own within 5 to 7 days. Antibiotic medicine does not cure viral pharyngitis.  For acute pharyngitis caused by bacteria, your healthcare provider will prescribe an antibiotic.  Marland Kitchen Do not smoke.  Marland Kitchen Avoid secondhand smoke and other air pollutants.  . Use a cool mist humidifier to add moisture to the air.  . Get plenty of rest.  . You may want to rest your throat  by talking less and eating a diet that is mostly liquid or soft for a day or two.  Marland Kitchen Nonprescription throat lozenges and mouthwashes should help relieve the soreness.  . Gargling with warm saltwater and drinking warm liquids may help. (You can make a saltwater solution by adding 1/4 teaspoon of salt to 8 ounces, or 240 mL, of warm water.)  . A nonprescription pain reliever such as aspirin, acetaminophen, or ibuprofen may ease general aches and pains. Exitcare handout on pharyngitis. FOLLOW UP with clinic provider if no improvements in the next 7-10 days. Exitcare handout on pharyngitis   Patient verbalized understanding of instructions and agreed with plan of care.  P2: Hand washing and diet. Amoxicillin 875mg  po BID x 10 days #20 RF0 dispensed from [facility] in case white spots throat/sinuses worsening start taking  flonase 1 spray each nostril BID, saline 2 sprays each nostril q2h wa prn congestion given 1 bottle from clinic stock to use at work.  If no improvement with 48 hours of saline and flonase use start amoxicillin 875mg  po BID x 10 days #20 RF0 dispensed from  [facility] to patient.  Denied personal or family history of ENT cancer.  Shower BID especially prior to bed. No evidence of systemic bacterial infection, non toxic and well hydrated.  I do not see where any further testing or imaging is necessary at this time.   I will suggest supportive care, rest, good hygiene and encourage the patient to take adequate fluids.  The patient is to return to clinic or EMERGENCY ROOM if symptoms worsen or change significantly.  Exitcare handout on sinusitis and sinus rinse.  Patient verbalized agreement and understanding of treatment plan and had no further questions at this time.   P2:  Hand washing and cover cough  There is active influenza in community symptoms started yesterday.  Rapid flu and strep testing not available in this clinic.  t99 last night no body aches if yes start tamiflu 75mg  po BID x 5 days #10 RF0  electronic Rx to his pharmacy of choice if body aches, fever, URI symptoms due to PMHx diabetes and HIV.  Hydrate.  Supportive measures tylenol 1000mg  po QID prn pain/fever.  Albuterol MDI 2p po q4-6h prn protracted cough/wheezing.  Tessalon pearles 200mg  po TID prn cough.  flonase and nasal saline for rhinitis prn.  Exitcare influenza. Follow up if dyspnea/SOB/hemoptysis, unable to void every 8 hours, dysphagia.   Patient verbalized understanding information/instructions, agreed with plan of care and had no further questions at this time. HIV and diabetes typically below 140 at lunch check; one day this week 200+

## 2018-09-14 ENCOUNTER — Other Ambulatory Visit: Payer: Self-pay | Admitting: Family Medicine

## 2018-09-15 ENCOUNTER — Other Ambulatory Visit: Payer: Self-pay | Admitting: Family Medicine

## 2018-09-17 ENCOUNTER — Other Ambulatory Visit: Payer: Self-pay | Admitting: Family Medicine

## 2018-10-04 ENCOUNTER — Other Ambulatory Visit: Payer: Self-pay | Admitting: Internal Medicine

## 2018-10-04 DIAGNOSIS — B20 Human immunodeficiency virus [HIV] disease: Secondary | ICD-10-CM

## 2018-10-23 ENCOUNTER — Other Ambulatory Visit: Payer: Self-pay | Admitting: Family Medicine

## 2018-10-24 ENCOUNTER — Ambulatory Visit: Payer: BLUE CROSS/BLUE SHIELD | Admitting: Internal Medicine

## 2018-11-22 ENCOUNTER — Other Ambulatory Visit: Payer: No Typology Code available for payment source

## 2018-11-22 DIAGNOSIS — B20 Human immunodeficiency virus [HIV] disease: Secondary | ICD-10-CM

## 2018-11-23 LAB — T-HELPER CELL (CD4) - (RCID CLINIC ONLY)
CD4 % Helper T Cell: 31 % — ABNORMAL LOW (ref 33–55)
CD4 T Cell Abs: 990 /uL (ref 400–2700)

## 2018-11-23 LAB — URINE CYTOLOGY ANCILLARY ONLY
Chlamydia: NEGATIVE
Neisseria Gonorrhea: NEGATIVE

## 2018-11-24 LAB — COMPLETE METABOLIC PANEL WITH GFR
AG Ratio: 1.8 (calc) (ref 1.0–2.5)
ALT: 33 U/L (ref 9–46)
AST: 28 U/L (ref 10–40)
Albumin: 4.8 g/dL (ref 3.6–5.1)
Alkaline phosphatase (APISO): 66 U/L (ref 40–115)
BUN: 20 mg/dL (ref 7–25)
CO2: 27 mmol/L (ref 20–32)
CREATININE: 1.29 mg/dL (ref 0.60–1.35)
Calcium: 10.6 mg/dL — ABNORMAL HIGH (ref 8.6–10.3)
Chloride: 102 mmol/L (ref 98–110)
GFR, Est African American: 75 mL/min/{1.73_m2} (ref >=60)
GFR, Est Non African American: 65 mL/min/{1.73_m2} (ref >=60)
Globulin: 2.6 g/dL (calc) (ref 1.9–3.7)
Glucose, Bld: 123 mg/dL — ABNORMAL HIGH (ref 65–99)
Potassium: 3.8 mmol/L (ref 3.5–5.3)
Sodium: 139 mmol/L (ref 135–146)
Total Bilirubin: 0.7 mg/dL (ref 0.2–1.2)
Total Protein: 7.4 g/dL (ref 6.1–8.1)

## 2018-11-24 LAB — CBC WITH DIFFERENTIAL/PLATELET
Absolute Monocytes: 706 cells/uL (ref 200–950)
Basophils Absolute: 43 cells/uL (ref 0–200)
Basophils Relative: 0.5 %
EOS PCT: 3.2 %
Eosinophils Absolute: 272 cells/uL (ref 15–500)
HEMATOCRIT: 49.3 % (ref 38.5–50.0)
Hemoglobin: 16.9 g/dL (ref 13.2–17.1)
Lymphs Abs: 3375 cells/uL (ref 850–3900)
MCH: 30.1 pg (ref 27.0–33.0)
MCHC: 34.3 g/dL (ref 32.0–36.0)
MCV: 87.9 fL (ref 80.0–100.0)
MPV: 10 fL (ref 7.5–12.5)
Monocytes Relative: 8.3 %
Neutro Abs: 4106 cells/uL (ref 1500–7800)
Neutrophils Relative %: 48.3 %
Platelets: 302 10*3/uL (ref 140–400)
RBC: 5.61 10*6/uL (ref 4.20–5.80)
RDW: 12.4 % (ref 11.0–15.0)
Total Lymphocyte: 39.7 %
WBC: 8.5 10*3/uL (ref 3.8–10.8)

## 2018-11-24 LAB — HIV-1 RNA QUANT-NO REFLEX-BLD
HIV 1 RNA Quant: <20 copies/mL
HIV-1 RNA QUANT, LOG: NOT DETECTED {Log_copies}/mL

## 2018-11-24 LAB — RPR: RPR Ser Ql: NONREACTIVE

## 2018-11-29 ENCOUNTER — Other Ambulatory Visit: Payer: Self-pay | Admitting: Family Medicine

## 2018-11-30 ENCOUNTER — Telehealth: Payer: Self-pay

## 2018-11-30 DIAGNOSIS — B20 Human immunodeficiency virus [HIV] disease: Secondary | ICD-10-CM

## 2018-11-30 NOTE — Telephone Encounter (Signed)
Initiated Prior Authorization for [employer] on  11/30/2021.   Faxed PA and additional information to [phone]  Waiting for Rx benefits to fax response.

## 2018-12-06 ENCOUNTER — Encounter: Payer: Self-pay | Admitting: Internal Medicine

## 2018-12-06 ENCOUNTER — Ambulatory Visit (INDEPENDENT_AMBULATORY_CARE_PROVIDER_SITE_OTHER): Payer: No Typology Code available for payment source | Admitting: Internal Medicine

## 2018-12-06 VITALS — BP 136/77 | HR 77 | Temp 98.1°F | Wt 160.0 lb

## 2018-12-06 DIAGNOSIS — R110 Nausea: Secondary | ICD-10-CM

## 2018-12-06 DIAGNOSIS — B20 Human immunodeficiency virus [HIV] disease: Secondary | ICD-10-CM | POA: Diagnosis not present

## 2018-12-06 DIAGNOSIS — F419 Anxiety disorder, unspecified: Secondary | ICD-10-CM | POA: Diagnosis not present

## 2018-12-06 DIAGNOSIS — Z23 Encounter for immunization: Secondary | ICD-10-CM

## 2018-12-06 MED ORDER — BICTEGRAVIR-EMTRICITAB-TENOFOV 50-200-25 MG PO TABS: 1.0000 | ORAL_TABLET | Freq: Every day | ORAL | 11 refills | Status: DC

## 2018-12-06 MED ORDER — ELVITEG-COBIC-EMTRICIT-TENOFAF 150-150-200-10 MG PO TABS: ORAL_TABLET | ORAL | 2 refills | Status: DC

## 2018-12-06 NOTE — Assessment & Plan Note (Addendum)
I do not know the cause of his chronic, intermittent nausea and vomiting but it seems to have improved recently.  It does not appear to be causing a problem taking his medications.

## 2018-12-06 NOTE — Assessment & Plan Note (Signed)
His anxiety and depression are in remission. 

## 2018-12-06 NOTE — Assessment & Plan Note (Signed)
His infection remains under excellent, long-term control.  I will change him to [facility] which he can take each morning on an empty stomach to improve his adherence.  He will follow-up after lab work in 1 year.

## 2018-12-06 NOTE — Progress Notes (Signed)
Patient Active Problem List   Diagnosis Date Noted  . DM II (diabetes mellitus, type II), controlled (HCC) 12/11/2009    Priority: High  . Hyperlipidemia 08/20/2008    Priority: High  . Human immunodeficiency virus (HIV) disease (HCC) 06/01/2007    Priority: High  . Chronic nausea 12/06/2018  . Rectal bleeding 12/27/2014  . Elevated LFTs 12/16/2014  . Left wrist pain 09/16/2014  . Eustachian tube dysfunction 08/09/2013  . Routine general medical examination at a health care facility 02/07/2013  . Dyshidrotic eczema 02/07/2013  . Heel pain 09/23/2011  . DECREASED HEARING, BILATERAL 09/18/2009  . LOW BACK PAIN, CHRONIC 10/17/2008  . VENEREAL WART 08/20/2008  . GERD 06/05/2007  . Anxiety 06/01/2007  . ALLERGIC RHINITIS 06/01/2007  . PSORIASIS NEC 06/01/2007  . HEPATITIS B, HX OF 06/01/2007    Patient's Medications  New Prescriptions   BICTEGRAVIR-EMTRICITABINE-TENOFOVIR AF (BIKTARVY) 50-200-25 MG TABS TABLET    Take 1 tablet by mouth daily.  Previous Medications   ALBUTEROL (PROVENTIL HFA;VENTOLIN HFA) 108 (90 BASE) MCG/ACT INHALER    Inhale 1-2 puffs into the lungs every 4 (four) hours as needed for wheezing or shortness of breath.   ASPIRIN 81 MG TABLET    Take 81 mg by mouth daily.   ATORVASTATIN (LIPITOR) 10 MG TABLET    TAKE 1 TABLET BY MOUTH NIGHTLY   BD PEN NEEDLE NANO U/F 32G X 4 MM MISC    USE 1 NEEDLE TO INJECT 24 UNITS INTO THE SKIN DAILY.   BUPROPION (WELLBUTRIN XL) 300 MG 24 HR TABLET    TAKE 1 TABLET BY MOUTH EVERY DAY   FENOFIBRATE 160 MG TABLET    TAKE 1 TABLET BY MOUTH EVERY DAY   GLUCOSE BLOOD TEST STRIP    1 each by Other route 3 (three) times daily as needed. Use as directed.    IBUPROFEN (ADVIL,MOTRIN) 200 MG TABLET    Take 600 mg by mouth every 6 (six) hours as needed for moderate pain.   JARDIANCE 10 MG TABS TABLET    TAKE 1 TABLET BY MOUTH DAILY   LISINOPRIL (PRINIVIL,ZESTRIL) 5 MG TABLET    TAKE 1 TABLET (5 MG TOTAL) BY MOUTH DAILY.   LORATADINE (CLARITIN) 10 MG TABLET    Take 10 mg by mouth daily.   MAGNESIUM OXIDE (MAG-OX) 400 MG TABLET    Take 400 mg by mouth daily.   MENTHOL (ICY HOT) 5 % PTCH    Apply 1 patch topically as needed (knee pain).   MULTIPLE VITAMIN (MULTIVITAMIN WITH MINERALS) TABS TABLET    Take 1 tablet by mouth daily.   ONETOUCH DELICA LANCETS 33G MISC    by Does not apply route.   PANTOPRAZOLE (PROTONIX) 40 MG TABLET    TAKE 1 TABLET BY MOUTH TWICE A DAY   PROBIOTIC PRODUCT (PROBIOTIC DAILY PO)    Take by mouth.   SODIUM CHLORIDE (OCEAN) 0.65 % SOLN NASAL SPRAY    Place 2 sprays into both nostrils every 2 (two) hours while awake.   TADALAFIL (CIALIS) 20 MG TABLET    Take 1/2 to 1 tablet as needed.   VICTOZA 18 MG/3ML SOPN    INJECT 1.8 MG (0.3ML) INTO THE SKIN DAILY  Modified Medications   No medications on file  Discontinued Medications   ELVITEGRAVIR-COBICISTAT-EMTRICITABINE-TENOFOVIR (GENVOYA) 150-150-200-10 MG TABS TABLET    TAKE 1 TABLET BY MOUTH EVERY DAY WITH BREAKFAST. STORE IN ORIGINAL CONTAINER AT ROOM TEMPERATURE.  Subjective: Frederick Abbott is in for his routine HIV follow-up visit.  He just got reapproval of Genvoya from his insurance company.  He has had no problems obtaining or tolerating it.  He estimates that he has missed about 3 doses a month recently.  He takes it around 10 PM close to bedtime with a snack but has occasionally fallen asleep in the forgotten to take it.  He takes his diabetic medication first thing in the morning on an empty stomach and never misses a dose.  He has a new job working at [employer].  He states that he is much happier.  He denies any anxiety or depression.  His intermittent nausea and vomiting is much less of a problem now.  It might occur only once a month.  He is not aware of any trigger for it.  Review of Systems: Review of Systems  Constitutional: Negative for chills, diaphoresis, fever, malaise/fatigue and weight loss.  HENT: Negative for sore  throat.   Respiratory: Negative for cough, sputum production and shortness of breath.   Cardiovascular: Negative for chest pain.  Gastrointestinal: Positive for nausea and vomiting. Negative for abdominal pain, diarrhea and heartburn.  Genitourinary: Negative for dysuria and frequency.  Musculoskeletal: Negative for joint pain and myalgias.  Skin: Negative for rash.  Neurological: Negative for dizziness and headaches.  Psychiatric/Behavioral: Negative for depression and substance abuse. The patient is not nervous/anxious.     Past Medical History:  Diagnosis Date  . Anxiety   . Belching   . Cholecystitis chronic   . Depression   . Diarrhea    "comes and goes" (12/14/2017)  . GERD (gastroesophageal reflux disease)   . Hepatitis A   . Hepatitis B   . HIV (human immunodeficiency virus infection) (HCC) dx'd 12/18/1995  . Hypercholesterolemia   . Neuromuscular disorder (HCC)    neuropathy  . Neuropathy   . Pneumonia    childhood  . Seasonal allergies   . Seizures (HCC)    "as a child, none since age 71; sister had them too" (12/14/2017)  . Type II diabetes mellitus (HCC)     Social History   Tobacco Use  . Smoking status: Former Smoker    Packs/day: 0.80    Years: 10.00    Pack years: 8.00    Types: Cigarettes    Last attempt to quit: 05/06/2006    Years since quitting: 12.5  . Smokeless tobacco: Never Used  Substance Use Topics  . Alcohol use: Yes    Alcohol/week: 2.0 standard drinks    Types: 2 Cans of beer per week  . Drug use: Yes    Types: Marijuana    Comment: 12/14/2017 'last used 1 wk ago"    Family History  Problem Relation Age of Onset  . Cancer Mother        breast  . Cancer Maternal Grandmother        melanoma-nose  . Cancer Paternal Grandfather        melanoma-ear  . Colon cancer Neg Hx   . Esophageal cancer Neg Hx   . Rectal cancer Neg Hx   . Stomach cancer Neg Hx     Allergies  Allergen Reactions  . Phenytoin Other (See Comments)    Childhood pt  unsure  . Metformin And Related Nausea And Vomiting    Health Maintenance  Topic Date Due  . HEMOGLOBIN A1C  12/17/2018  . FOOT EXAM  12/20/2018  . OPHTHALMOLOGY EXAM  01/03/2019  . TETANUS/TDAP  08/10/2023  . INFLUENZA VACCINE  Completed  . PNEUMOCOCCAL POLYSACCHARIDE VACCINE AGE 63-64 HIGH RISK  Completed  . HIV Screening  Completed    Objective:  Vitals:   12/06/18 1445  BP: 136/77  Pulse: 77  Temp: 98.1 F (36.7 C)  Weight: 160 lb (72.6 kg)   Body mass index is 22.32 kg/m.  Physical Exam Constitutional:      Comments: He is in good spirits.  HENT:     Mouth/Throat:     Pharynx: No oropharyngeal exudate.  Eyes:     Conjunctiva/sclera: Conjunctivae normal.  Cardiovascular:     Rate and Rhythm: Normal rate and regular rhythm.     Heart sounds: No murmur.  Pulmonary:     Effort: Pulmonary effort is normal.     Breath sounds: Normal breath sounds.  Abdominal:     Palpations: Abdomen is soft. There is no mass.     Tenderness: There is no abdominal tenderness.  Musculoskeletal: Normal range of motion.  Skin:    Findings: No rash.     Comments: He points out a very tiny nodule in his left upper arm that he has been concerned about.  He can barely feel it and do not suspect anything that might be a problem for him.  Neurological:     Mental Status: He is alert and oriented to person, place, and time.     Lab Results Lab Results  Component Value Date   WBC 8.5 11/22/2018   HGB 16.9 11/22/2018   HCT 49.3 11/22/2018   MCV 87.9 11/22/2018   PLT 302 11/22/2018    Lab Results  Component Value Date   CREATININE 1.29 11/22/2018   BUN 20 11/22/2018   NA 139 11/22/2018   K 3.8 11/22/2018   CL 102 11/22/2018   CO2 27 11/22/2018    Lab Results  Component Value Date   ALT 33 11/22/2018   AST 28 11/22/2018   GGT 51 06/16/2018   ALKPHOS 82 06/16/2018   BILITOT 0.7 11/22/2018    Lab Results  Component Value Date   CHOL 190 06/16/2018   HDL 43 06/16/2018    LDLCALC 101 (H) 06/16/2018   LDLDIRECT 92.0 12/20/2017   TRIG 230 (H) 06/16/2018   CHOLHDL 4.4 06/16/2018   Lab Results  Component Value Date   LABRPR NON-REACTIVE 11/22/2018   HIV 1 RNA Quant (copies/mL)  Date Value  11/22/2018 <20 NOT DETECTED  10/20/2017 32 (H)  10/12/2016 <20   CD4 T Cell Abs (/uL)  Date Value  11/22/2018 990  10/20/2017 1,100  10/12/2016 1,080     Problem List Items Addressed This Visit      High   Human immunodeficiency virus (HIV) disease (HCC)    His infection remains under excellent, long-term control.  [facility] which he can take each morning on an empty stomach to improve his adherence.  He will follow-up after lab work in 1 year.      Relevant Medications   bictegravir-emtricitabine-tenofovir AF (BIKTARVY) 50-200-25 MG TABS tablet   Other Relevant Orders   CBC   T-helper cell (CD4)- ([facility] clinic only)   Comprehensive metabolic panel   Lipid panel   RPR   HIV-1 RNA quant-no reflex-bld     Unprioritized   Chronic nausea    I do not know the cause of his chronic, intermittent nausea and vomiting but it seems to have improved recently.  It does not appear to be causing a  problem taking his medications.      Anxiety    His anxiety and depression are in remission.           Cliffton Asters, MD [facility] for Infectious Disease [facility] [phone] pager   [phone] cell 12/06/2018, 3:43 PM

## 2018-12-06 NOTE — Addendum Note (Signed)
Addended by: Andree Coss on: 12/06/2018 09:29 AM   Modules accepted: Orders

## 2018-12-06 NOTE — Telephone Encounter (Signed)
Approved 11/30/3028-12/01/2019. Pharmacy notified.

## 2018-12-06 NOTE — Addendum Note (Signed)
Addended by: Gerarda Fraction on: 12/06/2018 04:06 PM   Modules accepted: Orders

## 2018-12-07 ENCOUNTER — Telehealth: Payer: Self-pay | Admitting: Behavioral Health

## 2018-12-07 NOTE — Telephone Encounter (Addendum)
Prior authorization request submitted through cover my meds for Biktarvy.  Awaiting denial or approval will get an answer in 1-5 business days.  There was not confirmation number once it was sent to the plan. Angeline Slim RN

## 2018-12-15 ENCOUNTER — Other Ambulatory Visit: Payer: Self-pay | Admitting: Family Medicine

## 2018-12-15 ENCOUNTER — Ambulatory Visit (INDEPENDENT_AMBULATORY_CARE_PROVIDER_SITE_OTHER): Payer: No Typology Code available for payment source | Admitting: Family Medicine

## 2018-12-15 ENCOUNTER — Encounter: Payer: Self-pay | Admitting: Family Medicine

## 2018-12-15 ENCOUNTER — Encounter: Payer: Self-pay | Admitting: General Practice

## 2018-12-15 ENCOUNTER — Other Ambulatory Visit: Payer: Self-pay

## 2018-12-15 VITALS — BP 123/80 | HR 78 | Temp 98.4°F | Resp 16 | Ht 71.0 in | Wt 154.1 lb

## 2018-12-15 DIAGNOSIS — E119 Type 2 diabetes mellitus without complications: Secondary | ICD-10-CM | POA: Diagnosis not present

## 2018-12-15 DIAGNOSIS — Z0001 Encounter for general adult medical examination with abnormal findings: Secondary | ICD-10-CM | POA: Diagnosis not present

## 2018-12-15 DIAGNOSIS — B20 Human immunodeficiency virus [HIV] disease: Secondary | ICD-10-CM | POA: Diagnosis not present

## 2018-12-15 DIAGNOSIS — Z125 Encounter for screening for malignant neoplasm of prostate: Secondary | ICD-10-CM

## 2018-12-15 DIAGNOSIS — Z0000 Encounter for general adult medical examination without abnormal findings: Secondary | ICD-10-CM

## 2018-12-15 DIAGNOSIS — R509 Fever, unspecified: Secondary | ICD-10-CM

## 2018-12-15 LAB — CBC WITH DIFFERENTIAL/PLATELET
Basophils Absolute: 0 10*3/uL (ref 0.0–0.1)
Basophils Relative: 0.2 % (ref 0.0–3.0)
Eosinophils Absolute: 0 10*3/uL (ref 0.0–0.7)
Eosinophils Relative: 0.2 % (ref 0.0–5.0)
HCT: 49.2 % (ref 39.0–52.0)
Hemoglobin: 16.8 g/dL (ref 13.0–17.0)
LYMPHS ABS: 1.9 10*3/uL (ref 0.7–4.0)
Lymphocytes Relative: 14.3 % (ref 12.0–46.0)
MCHC: 34.1 g/dL (ref 30.0–36.0)
MCV: 89.5 fl (ref 78.0–100.0)
Monocytes Absolute: 1.4 10*3/uL — ABNORMAL HIGH (ref 0.1–1.0)
Monocytes Relative: 10 % (ref 3.0–12.0)
Neutro Abs: 10.2 10*3/uL — ABNORMAL HIGH (ref 1.4–7.7)
Neutrophils Relative %: 75.3 % (ref 43.0–77.0)
Platelets: 255 10*3/uL (ref 150.0–400.0)
RBC: 5.5 Mil/uL (ref 4.22–5.81)
RDW: 12.9 % (ref 11.5–15.5)
WBC: 13.5 10*3/uL — ABNORMAL HIGH (ref 4.0–10.5)

## 2018-12-15 LAB — BASIC METABOLIC PANEL
BUN: 16 mg/dL (ref 6–23)
CALCIUM: 9.1 mg/dL (ref 8.4–10.5)
CO2: 20 mEq/L (ref 19–32)
Chloride: 99 mEq/L (ref 96–112)
Creatinine, Ser: 1.19 mg/dL (ref 0.40–1.50)
GFR: 65.02 mL/min (ref >60.00)
Glucose, Bld: 137 mg/dL — ABNORMAL HIGH (ref 70–99)
Potassium: 4.1 mEq/L (ref 3.5–5.1)
Sodium: 136 mEq/L (ref 135–145)

## 2018-12-15 LAB — LIPID PANEL
Cholesterol: 139 mg/dL (ref 0–200)
HDL: 47.1 mg/dL (ref >39.00)
LDL Cholesterol: 68 mg/dL (ref 0–99)
NonHDL: 92.04
Total CHOL/HDL Ratio: 3
Triglycerides: 119 mg/dL (ref 0.0–149.0)
VLDL: 23.8 mg/dL (ref 0.0–40.0)

## 2018-12-15 LAB — POCT INFLUENZA A/B
Influenza A, POC: NEGATIVE
Influenza B, POC: NEGATIVE

## 2018-12-15 LAB — HEPATIC FUNCTION PANEL
ALT: 25 U/L (ref 0–53)
AST: 22 U/L (ref 0–37)
Albumin: 4.8 g/dL (ref 3.5–5.2)
Alkaline Phosphatase: 61 U/L (ref 39–117)
Bilirubin, Direct: 0.2 mg/dL (ref 0.0–0.3)
TOTAL PROTEIN: 7 g/dL (ref 6.0–8.3)
Total Bilirubin: 0.7 mg/dL (ref 0.2–1.2)

## 2018-12-15 LAB — TSH: TSH: 2.93 u[IU]/mL (ref 0.35–4.50)

## 2018-12-15 LAB — HEMOGLOBIN A1C: Hgb A1c MFr Bld: 7.8 % — ABNORMAL HIGH (ref 4.6–6.5)

## 2018-12-15 LAB — PSA: PSA: 0.53 ng/mL (ref 0.10–4.00)

## 2018-12-15 NOTE — Assessment & Plan Note (Signed)
Pt's PE WNL.  UTD on immunizations.  Check labs.  Anticipatory guidance provided.  

## 2018-12-15 NOTE — Assessment & Plan Note (Signed)
UTD on eye exam- due this month.  Foot exam done today.  Check labs.  Adjust meds prn

## 2018-12-15 NOTE — Assessment & Plan Note (Signed)
Chronic problem, following w/ Dr Campbell 

## 2018-12-15 NOTE — Patient Instructions (Addendum)
Follow up in 3-4 months to recheck diabetes We'll notify you of your lab results and make any changes if needed Continue to work on healthy diet and regular exercise- you look great! Your flu test was negative so this is a viral illness that just needs to run its course Lots of fluids and rest Alternate tylenol/ibuprofen as needed for fever, headache, body aches Make sure you schedule your eye exam Call with any questions or concerns Hang in there!

## 2018-12-15 NOTE — Progress Notes (Signed)
   Subjective:    Patient ID: Frederick Abbott, male    DOB: 1971-03-01, 48 y.o.   MRN: [id number]  HPI CPE- UTD on immunizations, eye exam, due for foot exam.   Review of Systems Patient reports no vision/hearing changes, anorexia, adenopathy, persistant/recurrent hoarseness, swallowing issues, chest pain, palpitations, edema, persistant/recurrent cough, hemoptysis, dyspnea (rest,exertional, paroxysmal nocturnal), gastrointestinal  bleeding (melena, rectal bleeding), abdominal pain, excessive heart burn, GU symptoms (dysuria, hematuria, voiding/incontinence issues) syncope, focal weakness, memory loss, numbness & tingling, skin/hair/nail changes, depression, anxiety, abnormal bruising/bleeding, musculoskeletal symptoms/signs.   + fever x3 days- Tuesday was 100.4, Wednesday after work 100.9, called out of work yesterday.  Tm 100.2.  Intermittent productive cough.  Denies body aches.  + sick contacts at work.  + HA.  No sinus pain/pressure. + fatigue.    Objective:   Physical Exam General Appearance:    Alert, cooperative, no distress, appears stated age  Head:    Normocephalic, without obvious abnormality, atraumatic  Eyes:    PERRL, conjunctiva/corneas clear, EOM's intact, fundi    benign, both eyes       Ears:    Normal TM's and external ear canals, both ears  Nose:   Nares normal, septum midline, mucosa normal, no drainage   or sinus tenderness  Throat:   Lips, mucosa, and tongue normal; teeth and gums normal  Neck:   Supple, symmetrical, trachea midline, no adenopathy;       thyroid:  No enlargement/tenderness/nodules  Back:     Symmetric, no curvature, ROM normal, no CVA tenderness  Lungs:     Clear to auscultation bilaterally, respirations unlabored  Chest wall:    No tenderness or deformity  Heart:    Regular rate and rhythm, S1 and S2 normal, no murmur, rub   or gallop  Abdomen:     Soft, non-tender, bowel sounds active all four quadrants,    no masses, no organomegaly    Genitalia:    Deferred at pt's request  Rectal:    Extremities:   Extremities normal, atraumatic, no cyanosis or edema  Pulses:   2+ and symmetric all extremities  Skin:   Skin color, texture, turgor normal, no rashes or lesions  Lymph nodes:   Cervical, supraclavicular, and axillary nodes normal  Neurologic:   CNII-XII intact. Normal strength, sensation and reflexes      throughout          Assessment & Plan:  Fever- afebrile today, no evidence of bacterial infxn on PE so no abx needed.  Suspect viral illness- flu test negative.  Reviewed supportive care and red flags that should prompt return.  Pt expressed understanding and is in agreement w/ plan.

## 2018-12-17 ENCOUNTER — Encounter: Payer: Self-pay | Admitting: Family Medicine

## 2018-12-19 MED ORDER — PANTOPRAZOLE SODIUM 40 MG PO TBEC: DELAYED_RELEASE_TABLET | ORAL | 1 refills | Status: DC

## 2018-12-21 NOTE — Telephone Encounter (Addendum)
Contacted insurance for appropriate forms to complete for PA request for Biktarvy to [phone] currently awaiting approval.

## 2018-12-25 ENCOUNTER — Telehealth: Payer: Self-pay

## 2018-12-25 ENCOUNTER — Other Ambulatory Visit: Payer: Self-pay | Admitting: General Practice

## 2018-12-25 MED ORDER — PANTOPRAZOLE SODIUM 40 MG PO TBEC: DELAYED_RELEASE_TABLET | ORAL | 1 refills | Status: DC

## 2018-12-25 NOTE — Telephone Encounter (Signed)
Attempted to call pharmacy to notify of PA approval, no answer.

## 2018-12-25 NOTE — Telephone Encounter (Signed)
Fax received coverage was approved on 12/23/2018 for Biktarvy 50 mg-200 mg-25 mg tablet. Coverage is from 12/21/2018 - 12/22/2019 for 12 refills of 30 units for each refill.

## 2019-01-02 ENCOUNTER — Other Ambulatory Visit: Payer: Self-pay | Admitting: Family Medicine

## 2019-01-05 ENCOUNTER — Other Ambulatory Visit: Payer: Self-pay

## 2019-01-05 DIAGNOSIS — B20 Human immunodeficiency virus [HIV] disease: Secondary | ICD-10-CM

## 2019-01-05 MED ORDER — BICTEGRAVIR-EMTRICITAB-TENOFOV 50-200-25 MG PO TABS: 1.0000 | ORAL_TABLET | Freq: Every day | ORAL | 11 refills | Status: DC

## 2019-01-05 NOTE — Telephone Encounter (Signed)
Patient sent my chart messaged stating he was going to be out of his Biktarvy after making several request through his pharmacy.   Biktary  was sent to pharmacy and patient notified to call pharmacy for overnight shipment .   Laurell Josephs, RN

## 2019-02-01 NOTE — Progress Notes (Signed)
Labs were reviewed with pt in clinic shortly after lab draw. Also reviewed by pcp in office in Sept 2019. Recently seen by pcp Feb 2020 and new labs completed with improvement in lipids, as well as BP. However A1c was worsening despite Jardiance and Victoza, so referral to pt's former endocrinologist was placed. Dr. Talmage Nap is not on Epic at [facility], so any OV notes if pt has seen them are not available to view. Though pt follows closely with pcp in [facility] network. Recent labs used for 2020 Be Well eval.

## 2019-02-02 NOTE — Progress Notes (Signed)
noted 

## 2019-02-12 ENCOUNTER — Encounter: Payer: Self-pay | Admitting: Family Medicine

## 2019-03-19 ENCOUNTER — Other Ambulatory Visit: Payer: Self-pay | Admitting: Family Medicine

## 2019-04-09 ENCOUNTER — Ambulatory Visit: Payer: Self-pay | Admitting: Family Medicine

## 2019-04-11 LAB — HEMOGLOBIN A1C: Hemoglobin A1C: 6.2

## 2019-04-19 ENCOUNTER — Encounter: Payer: Self-pay | Admitting: General Practice

## 2019-05-28 ENCOUNTER — Other Ambulatory Visit: Payer: Self-pay | Admitting: Family Medicine

## 2019-05-29 ENCOUNTER — Other Ambulatory Visit: Payer: Self-pay | Admitting: Family Medicine

## 2019-06-05 ENCOUNTER — Other Ambulatory Visit: Payer: Self-pay | Admitting: Family Medicine

## 2019-06-05 NOTE — Telephone Encounter (Signed)
Patient is due for DM f/u

## 2019-06-08 ENCOUNTER — Ambulatory Visit (INDEPENDENT_AMBULATORY_CARE_PROVIDER_SITE_OTHER): Payer: No Typology Code available for payment source | Admitting: Physician Assistant

## 2019-06-08 ENCOUNTER — Other Ambulatory Visit: Payer: Self-pay

## 2019-06-08 ENCOUNTER — Telehealth: Payer: Self-pay | Admitting: Family Medicine

## 2019-06-08 ENCOUNTER — Encounter: Payer: Self-pay | Admitting: Physician Assistant

## 2019-06-08 VITALS — BP 132/82 | HR 88 | Temp 98.8°F | Resp 16 | Ht 71.0 in | Wt 161.0 lb

## 2019-06-08 DIAGNOSIS — B3742 Candidal balanitis: Secondary | ICD-10-CM | POA: Diagnosis not present

## 2019-06-08 MED ORDER — HYDROCORTISONE ACETATE 2.5 % EX CREA: 1.0000 "application " | TOPICAL_CREAM | Freq: Two times a day (BID) | CUTANEOUS | 0 refills | Status: DC

## 2019-06-08 MED ORDER — CLOTRIMAZOLE 1 % EX OINT: TOPICAL_OINTMENT | CUTANEOUS | 0 refills | Status: DC

## 2019-06-08 NOTE — Telephone Encounter (Signed)
Pt has been scheduled with Frederick Abbott °

## 2019-06-08 NOTE — Progress Notes (Signed)
Patient presents to clinic today c/o raw "rash" of penile shaft that has been waxing and waning x 4 weeks. Denies lesion or scrotum or inguinal region. Denies penile or testicular pain or swelling. Denies penile discharge or concern for STI. Is on Jardiance for diabetes. Notes fasting glucose levels are well-controlled (followed by Dr. Chalmers Cater). Read that yeast infections of genitals can occur with his medication.  Past Medical History:  Diagnosis Date  . Anxiety   . Belching   . Cholecystitis chronic   . Depression   . Diarrhea    "comes and goes" (12/14/2017)  . GERD (gastroesophageal reflux disease)   . Hepatitis A   . Hepatitis B   . HIV (human immunodeficiency virus infection) ([place]) dx'd 12/18/1995  . Hypercholesterolemia   . Neuromuscular disorder (HCC)    neuropathy  . Neuropathy   . Pneumonia    childhood  . Seasonal allergies   . Seizures ([place])    "as a child, none since age 15; sister had them too" (12/14/2017)  . Type II diabetes mellitus ([place])     Current Outpatient Medications on File Prior to Visit  Medication Sig Dispense Refill  . albuterol (PROVENTIL HFA;VENTOLIN HFA) 108 (90 Base) MCG/ACT inhaler Inhale 1-2 puffs into the lungs every 4 (four) hours as needed for wheezing or shortness of breath. 1 Inhaler 0  . aspirin 81 MG tablet Take 81 mg by mouth daily.    Marland Kitchen atorvastatin (LIPITOR) 10 MG tablet TAKE 1 TABLET BY MOUTH EVERY DAY AT NIGHT 90 tablet 1  . BD PEN NEEDLE NANO U/F 32G X 4 MM MISC USE 1 NEEDLE TO INJECT 24 UNITS INTO THE SKIN DAILY. 100 each 3  . bictegravir-emtricitabine-tenofovir AF (BIKTARVY) 50-200-25 MG TABS tablet Take 1 tablet by mouth daily. 30 tablet 11  . buPROPion (WELLBUTRIN XL) 300 MG 24 hr tablet TAKE 1 TABLET BY MOUTH EVERY DAY 90 tablet 0  . fenofibrate 160 MG tablet TAKE 1 TABLET BY MOUTH EVERY DAY 90 tablet 1  . glucose blood test strip 1 each by Other route 3 (three) times daily as needed. Use as directed.     Marland Kitchen ibuprofen (ADVIL,MOTRIN)  200 MG tablet Take 600 mg by mouth every 6 (six) hours as needed for moderate pain.    Marland Kitchen JARDIANCE 10 MG TABS tablet TAKE 1 TABLET BY MOUTH EVERY DAY 90 tablet 0  . lisinopril (ZESTRIL) 5 MG tablet TAKE 1 TABLET (5 MG TOTAL) BY MOUTH DAILY. 90 tablet 1  . loratadine (CLARITIN) 10 MG tablet Take 10 mg by mouth daily.    . magnesium oxide (MAG-OX) 400 MG tablet Take 400 mg by mouth daily.    . Menthol (ICY HOT) 5 % PTCH Apply 1 patch topically as needed (knee pain).    . Multiple Vitamin (MULTIVITAMIN WITH MINERALS) TABS tablet Take 1 tablet by mouth daily.    Glory Rosebush DELICA LANCETS 13Y MISC by Does not apply route.    . pantoprazole (PROTONIX) 40 MG tablet TAKE 1 TABLET BY MOUTH EVERY DAY 90 tablet 1  . Probiotic Product (PROBIOTIC DAILY PO) Take by mouth.    . RYBELSUS 14 MG TABS TAKE 1 TABLET BY MOUTH EVERY DAY 30 MIN BEFORE BREAKFAST/MEAL ON AN EMPTY STOMACH W/ 4 OUNCES WATER    . tadalafil (CIALIS) 20 MG tablet TAKE 1/2 TO 1 TABLET BY MOUTH AS NEEDED 6 tablet 8  . TOUJEO SOLOSTAR 300 UNIT/ML SOPN 6 Units by Subdermal route daily.    Marland Kitchen  sodium chloride (OCEAN) 0.65 % SOLN nasal spray Place 2 sprays into both nostrils every 2 (two) hours while awake.  0   No current facility-administered medications on file prior to visit.     Allergies  Allergen Reactions  . Phenytoin Other (See Comments)    Childhood pt unsure  . Metformin And Related Nausea And Vomiting    Family History  Problem Relation Age of Onset  . Cancer Mother        breast  . Cancer Maternal Grandmother        melanoma-nose  . Cancer Paternal Grandfather        melanoma-ear  . Colon cancer Neg Hx   . Esophageal cancer Neg Hx   . Rectal cancer Neg Hx   . Stomach cancer Neg Hx     Social History   Socioeconomic History  . Marital status: Married    Spouse name: Not on file  . Number of children: Not on file  . Years of education: Not on file  . Highest education level: Not on file  Occupational History  .  Not on file  Social Needs  . Financial resource strain: Not on file  . Food insecurity    Worry: Not on file    Inability: Not on file  . Transportation needs    Medical: Not on file    Non-medical: Not on file  Tobacco Use  . Smoking status: Former Smoker    Packs/day: 0.80    Years: 10.00    Pack years: 8.00    Types: Cigarettes    Quit date: 05/06/2006    Years since quitting: 13.0  . Smokeless tobacco: Never Used  Substance and Sexual Activity  . Alcohol use: Yes    Alcohol/week: 2.0 standard drinks    Types: 2 Cans of beer per week  . Drug use: Yes    Types: Marijuana    Comment: 12/14/2017 'last used 1 wk ago"  . Sexual activity: Yes    Partners: Male    Comment: declined condoms  Lifestyle  . Physical activity    Days per week: Not on file    Minutes per session: Not on file  . Stress: Not on file  Relationships  . Social Musicianconnections    Talks on phone: Not on file    Gets together: Not on file    Attends religious service: Not on file    Active member of club or organization: Not on file    Attends meetings of clubs or organizations: Not on file    Relationship status: Not on file  Other Topics Concern  . Not on file  Social History Narrative  . Not on file   Review of Systems - See HPI.  All other ROS are negative.  BP 132/82   Pulse 88   Temp 98.8 F (37.1 C) (Skin)   Resp 16   Ht 5\' 11"  (1.803 m)   Wt 161 lb (73 kg)   SpO2 99%   BMI 22.45 kg/m   Physical Exam Vitals signs reviewed.  Constitutional:      Appearance: Normal appearance.  HENT:     Head: Normocephalic and atraumatic.  Genitourinary:    Penis: Erythema (noted underneath coronary sulcus and distal shaft with shining and some scaling noted, concerning for candidal balanitis) and swelling present. No phimosis, paraphimosis, discharge or lesions.   Neurological:     Mental Status: He is alert.    Recent Results (from the past  2160 hour(s))  Hemoglobin A1c     Status: None    Collection Time: 04/11/19 12:00 AM  Result Value Ref Range   Hemoglobin A1C 6.2     Assessment/Plan: 1. Candidal balanitis Start clotrimazole and hydrocortisone ointments BID x 7-10 days. Azole can continue for up to 14 days. Hygiene reviewed. Avoid any sexual activity until cleared. Discussed his [place] PepperJardiance is likely a triggering factor for this. May need to discuss with PCP if recurring after treatment.    Piedad ClimesWilliam Cody Evander Macaraeg, PA-C

## 2019-06-08 NOTE — Patient Instructions (Signed)
Please apply the creams as directed to the area. Keep skin clean and dry.  No sexual activity or masturbation until this resolves.  Follow-up if not improving.   Genital Yeast Infection, Male In men, a genital yeast infection is a condition that causes soreness, swelling, and redness (inflammation) of the head of the penis (glans penis). A genital yeast infection can be spread through sexual contact, but it can also develop without sexual contact. If the infection is not treated properly, it is likely to come back. What are the causes? This condition is caused by a change in the normal balance of the yeast and bacteria that live on the skin. This change causes an overgrowth of yeast, which causes the inflammation. Many types of yeast can cause this infection, but Candida is the most common. What increases the risk? The following factors may make you more likely to develop this condition:  Taking antibiotics.  Having diabetes.  Being exposed to the infection by a sexual partner.  Being uncircumcised.  Having a weak body defense system (immune system).  Taking steroid medicines for a long time.  Having poor hygiene. What are the signs or symptoms? Symptoms of this condition include:  Itching of the groin and penis.  Dry, red, or cracked skin on the penis.  Swelling of the genital area.  Pain while urinating or difficulty urinating.  Thick, bad-smelling discharge on the penis. How is this diagnosed? This condition may be diagnosed based on:  Your medical history.  A physical exam. You may also have tests, such as:  Test of a sample of discharge from the penis.  Urine tests.  Blood tests. How is this treated? This condition is treated with:  Anti-fungal creams or medicines. Anti-fungal medicines may be prescribed by your health care provider or they may be available over-the-counter.  Self-care at home. For men who are not circumcised, circumcision may be  recommended to control infections that return and are difficult to treat. Follow these instructions at home: Medicines   Take or apply over-the-counter and prescription medicines only as told by your health care provider.  Take your anti-fungal medicine as told by your health care provider. Do not stop taking the medicine even if you start to feel better. Self care  Wash your penis with soap and water every day. If you are not circumcised, pull back the foreskin to wash. Make sure to dry your penis completely after washing.  Wear breathable, cotton underwear.  Keep your underwear clean and dry. General instructions  Do not have sex until your health care provider has approved. Tell your sexual partner that you have a yeast infection. That person should go for treatment even if no symptoms are present.  If you have diabetes, keep your blood sugar levels within your target range.  Keep all follow-up visits as told by your health care provider. This is important. Contact a health care provider if you:  Have a fever.  Have symptoms that go away and then return.  Do not get better with treatment.  Have symptoms that get worse.  Have new symptoms. Get help right away if:  Your swelling and inflammation become so severe that you cannot urinate. Summary  In men, a genital yeast infection is a condition that causes soreness, swelling, and redness (inflammation) of the head of the penis (glans penis).  This condition is caused by a change in the normal balance of the yeast and bacteria that live on the skin. This change  causes an overgrowth of yeast, which causes the inflammation.  A genital yeast infection usually spreads through sexual contact, but it can develop without sexual contact. For instance, you may be more likely to develop this infection if you take antibiotics or steroids, have diabetes, are not circumcised, have a weak immune system, or have poor hygiene.  This  condition is treated with anti-fungal cream or pills along with self-care at home. This information is not intended to replace advice given to you by your health care provider. Make sure you discuss any questions you have with your health care provider. Document Released: 12/02/2004 Document Revised: 11/29/2017 Document Reviewed: 11/29/2017 Elsevier Patient Education  2020 [employer].

## 2019-06-08 NOTE — Telephone Encounter (Signed)
Per PCP please see if pt is willing to see Cody at 3pm today as we have no openings.

## 2019-06-08 NOTE — Telephone Encounter (Signed)
Pt called in stating that he had rash on his penis about 3wks ago it went away after applying neosporin. It has came back again and is spreading and is now on his penis and stomach. He states that he is taking Jardiance and has heard that some of the side effects include genital rash/infection. He states that he is freaking out about it because it looks worse and is spreading. Pt would like to be seen asap for this. Pt can reached at the home #   Please advise on if we can work him in for an in office visit, he is having no other symptoms besides the rash.

## 2019-06-10 ENCOUNTER — Other Ambulatory Visit: Payer: Self-pay | Admitting: Physician Assistant

## 2019-06-11 ENCOUNTER — Other Ambulatory Visit: Payer: Self-pay | Admitting: Internal Medicine

## 2019-06-11 DIAGNOSIS — B20 Human immunodeficiency virus [HIV] disease: Secondary | ICD-10-CM

## 2019-07-23 ENCOUNTER — Other Ambulatory Visit: Payer: Self-pay

## 2019-07-23 ENCOUNTER — Telehealth: Payer: Self-pay | Admitting: *Deleted

## 2019-07-23 DIAGNOSIS — Z20822 Contact with and (suspected) exposure to covid-19: Secondary | ICD-10-CM

## 2019-07-23 NOTE — Telephone Encounter (Signed)
Per HR Hyacinth Meeker, and confirmed with pt via phone, pt's sister passed away unexpectedly and pt visited mother on 07-14-19 to inform her of news. Mother was positive for Covid-19 at the time. Pt in home about 90min and in close contact with mother. [place] was then held on 07-20-19 with close contact of attendees. Masks were worn, but at least 50 people in advance with hugging, hand shaking etc.   Pt denies any current or previous sx. Sts his partner, cousin and brother in law whom he has been in the same household or vehicle with in the past week are all also denying sx.   Testing order placed. Pt given directions to [facility] testing site. Plans to go today prior to 3:30p. Informed he will see results either by MyChart or RN will call him once results are back, then HR will contact to discuss return to work.  Currently, pt's exposure date based on 07-14-19 visit with mother, and quarantine date based on 07-20-19 funeral.   Pt advised of Covid-19 sx and when to seek same day or emergency care of severe symptoms such as ShOB, chest pain, bluish lips/face, etc. Pt verbalizes understanding and agreement with plan of care. Sts no further questions.

## 2019-07-24 LAB — NOVEL CORONAVIRUS, NAA: SARS-CoV-2, NAA: NOT DETECTED

## 2019-07-24 NOTE — Telephone Encounter (Signed)
Noted agreed with plan of care 

## 2019-07-25 NOTE — Telephone Encounter (Signed)
SARS CoV2 PCR test negative.  See results note continue quarantine per previous instructions and monitoring for symptoms and discuss red flags/ER precautions with patient.

## 2019-07-30 NOTE — Progress Notes (Signed)
Pt was called by NT Rosita Fire and informed of negative result on 07/24/19. F/u, ER precautions, and quarantine instructions previously reviewed with pt at time of test scheduling. HR Hyacinth Meeker made aware of negative result on 07/25/19 and reports he spoke with pt over phone regarding continuation of quarantine. Sts pt's return to work date is 08/06/2019.

## 2019-07-31 NOTE — Progress Notes (Signed)
noted 

## 2019-08-19 ENCOUNTER — Encounter: Payer: Self-pay | Admitting: Family Medicine

## 2019-08-27 ENCOUNTER — Other Ambulatory Visit: Payer: Self-pay | Admitting: Family Medicine

## 2019-09-11 ENCOUNTER — Ambulatory Visit: Payer: Self-pay | Admitting: *Deleted

## 2019-09-11 ENCOUNTER — Encounter: Payer: Self-pay | Admitting: *Deleted

## 2019-09-11 ENCOUNTER — Other Ambulatory Visit: Payer: Self-pay

## 2019-09-11 VITALS — Temp 100.7°F

## 2019-09-11 DIAGNOSIS — Z20822 Contact with and (suspected) exposure to covid-19: Secondary | ICD-10-CM

## 2019-09-11 NOTE — Progress Notes (Signed)
Pt to RN office requesting temp check. Sts last night had chills and partner reported he felt very warm throughout the night and pt woke up multiple times sweating. He sts his blood sugar dropped to 60 last night and he attributed sx to that. Felt better this morning, just very fatigued. Temp 98.6 at temp check this morning. He brought oral thermometer with him from home and was checking temp throughout the morning and it was continuously increasing. Temp in clinic 100.29F. Also has frontal and temporal HA that began this morning. Denies otalgia, cough, nasal or chest congestion, ShOB, body aches.   Pt sent home to quarantine. Recommended partner quarantine as well. Covid testing order placed. [facility] site. Instructions for testing reviewed. Plan for testing on Day 4 of sx, 09/14/19. ER precautions reviewed. Will inform HR Hyacinth Meeker of pending testing. Advised pt HR will contact him to review benefits and return to work protocols. Safety manager Rachel Bo notified and sts he will perform contact tracing with pt later this morning over phone. Pt verbalizes understanding and agreement. No further questions/concerns.

## 2019-09-11 NOTE — Patient Instructions (Signed)

## 2019-09-14 ENCOUNTER — Other Ambulatory Visit: Payer: Self-pay

## 2019-09-14 DIAGNOSIS — Z20822 Contact with and (suspected) exposure to covid-19: Secondary | ICD-10-CM

## 2019-09-15 LAB — NOVEL CORONAVIRUS, NAA: SARS-CoV-2, NAA: NOT DETECTED

## 2019-09-17 ENCOUNTER — Telehealth: Payer: Self-pay | Admitting: *Deleted

## 2019-09-17 NOTE — Telephone Encounter (Signed)
Covid testing result reviewed with pt over phone. He was able to view results in MyChart day after test. He sts he already spoke with HR manager late last week and informed them that fever and fatigue resolved evening of morning onset. He has felt fine since then. No new or worsening sx. Per CDC current guidelines, pt given return to work date as 09/24/19. No further questions. Advised to contact clinic if any questions or concerns develop.

## 2019-10-24 ENCOUNTER — Other Ambulatory Visit: Payer: Self-pay | Admitting: Family Medicine

## 2019-11-01 ENCOUNTER — Other Ambulatory Visit: Payer: Self-pay | Admitting: Family Medicine

## 2019-11-20 ENCOUNTER — Other Ambulatory Visit: Payer: Self-pay | Admitting: Family Medicine

## 2019-11-23 ENCOUNTER — Other Ambulatory Visit: Payer: Self-pay | Admitting: Family Medicine

## 2019-11-30 ENCOUNTER — Other Ambulatory Visit: Payer: Self-pay

## 2019-11-30 DIAGNOSIS — B20 Human immunodeficiency virus [HIV] disease: Secondary | ICD-10-CM

## 2019-11-30 DIAGNOSIS — Z113 Encounter for screening for infections with a predominantly sexual mode of transmission: Secondary | ICD-10-CM

## 2019-12-03 ENCOUNTER — Other Ambulatory Visit: Payer: Self-pay

## 2019-12-03 ENCOUNTER — Other Ambulatory Visit (HOSPITAL_COMMUNITY)
Admission: RE | Admit: 2019-12-03 | Discharge: 2019-12-03 | Disposition: A | Discharge status: Home / Self Care | Payer: PRIVATE HEALTH INSURANCE | Source: Ambulatory Visit | Attending: Internal Medicine | Admitting: Internal Medicine

## 2019-12-03 ENCOUNTER — Other Ambulatory Visit: Payer: PRIVATE HEALTH INSURANCE

## 2019-12-03 DIAGNOSIS — Z113 Encounter for screening for infections with a predominantly sexual mode of transmission: Secondary | ICD-10-CM | POA: Diagnosis present

## 2019-12-03 DIAGNOSIS — B20 Human immunodeficiency virus [HIV] disease: Secondary | ICD-10-CM

## 2019-12-04 LAB — T-HELPER CELL (CD4) - (RCID CLINIC ONLY)
CD4 % Helper T Cell: 39 % (ref 33–65)
CD4 T Cell Abs: 868 /uL (ref 400–1790)

## 2019-12-04 LAB — URINE CYTOLOGY ANCILLARY ONLY
Chlamydia: NEGATIVE
Comment: NEGATIVE
Comment: NORMAL
Neisseria Gonorrhea: NEGATIVE

## 2019-12-06 LAB — COMPREHENSIVE METABOLIC PANEL
AG Ratio: 2.3 (calc) (ref 1.0–2.5)
ALT: 24 U/L (ref 9–46)
AST: 18 U/L (ref 10–40)
Albumin: 4.4 g/dL (ref 3.6–5.1)
Alkaline phosphatase (APISO): 67 U/L (ref 36–130)
BUN: 19 mg/dL (ref 7–25)
CO2: 27 mmol/L (ref 20–32)
Calcium: 9.6 mg/dL (ref 8.6–10.3)
Chloride: 104 mmol/L (ref 98–110)
Creat: 1.06 mg/dL (ref 0.60–1.35)
Globulin: 1.9 g/dL (calc) (ref 1.9–3.7)
Glucose, Bld: 133 mg/dL — ABNORMAL HIGH (ref 65–99)
Potassium: 4.4 mmol/L (ref 3.5–5.3)
Sodium: 140 mmol/L (ref 135–146)
Total Bilirubin: 0.4 mg/dL (ref 0.2–1.2)
Total Protein: 6.3 g/dL (ref 6.1–8.1)

## 2019-12-06 LAB — LIPID PANEL
Cholesterol: 153 mg/dL (ref <200)
HDL: 43 mg/dL (ref >=40)
LDL Cholesterol (Calc): 82 mg/dL (calc)
Non-HDL Cholesterol (Calc): 110 mg/dL (calc) (ref <130)
Total CHOL/HDL Ratio: 3.6 (calc) (ref <5.0)
Triglycerides: 186 mg/dL — ABNORMAL HIGH (ref <150)

## 2019-12-06 LAB — RPR: RPR Ser Ql: NONREACTIVE

## 2019-12-06 LAB — CBC
HCT: 46 % (ref 38.5–50.0)
Hemoglobin: 15.3 g/dL (ref 13.2–17.1)
MCH: 30.2 pg (ref 27.0–33.0)
MCHC: 33.3 g/dL (ref 32.0–36.0)
MCV: 90.7 fL (ref 80.0–100.0)
MPV: 9.9 fL (ref 7.5–12.5)
Platelets: 264 10*3/uL (ref 140–400)
RBC: 5.07 10*6/uL (ref 4.20–5.80)
RDW: 12.4 % (ref 11.0–15.0)
WBC: 7.3 10*3/uL (ref 3.8–10.8)

## 2019-12-06 LAB — HIV-1 RNA QUANT-NO REFLEX-BLD
HIV 1 RNA Quant: <20 copies/mL
HIV-1 RNA Quant, Log: <1.3 Log copies/mL

## 2019-12-07 ENCOUNTER — Other Ambulatory Visit: Payer: Self-pay

## 2019-12-14 ENCOUNTER — Telehealth: Payer: Self-pay

## 2019-12-14 NOTE — Telephone Encounter (Signed)
COVID-19 Pre-Screening Questions:12/14/19  Do you currently have a fever (>100 F), chills or unexplained body aches?NO   Are you currently experiencing new cough, shortness of breath, sore throat, runny nose?NO .  Have you recently travelled outside the state of [place] in the last 14 days?NO .  Have you been in contact with someone that is currently pending confirmation of Covid19 testing or has been confirmed to have the Covid19 virus? NO   **If the patient answers NO to ALL questions -  advise the patient to please call the clinic before coming to the office should any symptoms develop.     

## 2019-12-17 ENCOUNTER — Other Ambulatory Visit: Payer: Self-pay

## 2019-12-17 ENCOUNTER — Ambulatory Visit: Payer: PRIVATE HEALTH INSURANCE | Admitting: Internal Medicine

## 2019-12-17 ENCOUNTER — Other Ambulatory Visit: Payer: Self-pay | Admitting: Family Medicine

## 2019-12-17 ENCOUNTER — Encounter: Payer: Self-pay | Admitting: Internal Medicine

## 2019-12-17 DIAGNOSIS — F419 Anxiety disorder, unspecified: Secondary | ICD-10-CM | POA: Diagnosis not present

## 2019-12-17 DIAGNOSIS — B20 Human immunodeficiency virus [HIV] disease: Secondary | ICD-10-CM

## 2019-12-17 NOTE — Assessment & Plan Note (Signed)
His infection remains under excellent, long-term control.  He will continue Biktarvy and follow-up here after lab work in 1 year. 

## 2019-12-17 NOTE — Assessment & Plan Note (Signed)
His chronic anxiety and depression has been exacerbated by an acute grief reaction.  Encouraged him to continue with his counseling.

## 2019-12-17 NOTE — Progress Notes (Signed)
Patient Active Problem List   Diagnosis Date Noted  . DM II (diabetes mellitus, type II), controlled (HCC) 12/11/2009    Priority: High  . Hyperlipidemia 08/20/2008    Priority: High  . Human immunodeficiency virus (HIV) disease (HCC) 06/01/2007    Priority: High  . Chronic nausea 12/06/2018  . Rectal bleeding 12/27/2014  . Elevated LFTs 12/16/2014  . Eustachian tube dysfunction 08/09/2013  . Routine general medical examination at a health care facility 02/07/2013  . Dyshidrotic eczema 02/07/2013  . DECREASED HEARING, BILATERAL 09/18/2009  . LOW BACK PAIN, CHRONIC 10/17/2008  . VENEREAL WART 08/20/2008  . GERD 06/05/2007  . Anxiety 06/01/2007  . ALLERGIC RHINITIS 06/01/2007  . PSORIASIS NEC 06/01/2007  . HEPATITIS B, HX OF 06/01/2007    Patient's Medications  New Prescriptions   No medications on file  Previous Medications   ALBUTEROL (PROVENTIL HFA;VENTOLIN HFA) 108 (90 BASE) MCG/ACT INHALER    Inhale 1-2 puffs into the lungs every 4 (four) hours as needed for wheezing or shortness of breath.   ASPIRIN 81 MG TABLET    Take 81 mg by mouth daily.   ATORVASTATIN (LIPITOR) 10 MG TABLET    TAKE 1 TABLET BY MOUTH EVERY DAY AT NIGHT   BD PEN NEEDLE NANO U/F 32G X 4 MM MISC    USE 1 NEEDLE TO INJECT 24 UNITS INTO THE SKIN DAILY.   BIKTARVY 50-200-25 MG TABS TABLET    TAKE 1 TABLET BY MOUTH DAILY   BUPROPION (WELLBUTRIN XL) 300 MG 24 HR TABLET    TAKE 1 TABLET BY MOUTH EVERY DAY   FENOFIBRATE 160 MG TABLET    TAKE 1 TABLET BY MOUTH EVERY DAY   GLUCOSE BLOOD TEST STRIP    1 each by Other route 3 (three) times daily as needed. Use as directed.    HYDROCORTISONE ACETATE 2.5 % CREA    Apply 1 application topically 2 (two) times daily. For 7-10 days   IBUPROFEN (ADVIL,MOTRIN) 200 MG TABLET    Take 600 mg by mouth every 6 (six) hours as needed for moderate pain.   JARDIANCE 10 MG TABS TABLET    TAKE 1 TABLET BY MOUTH EVERY DAY   LISINOPRIL (ZESTRIL) 5 MG TABLET    TAKE 1  TABLET BY MOUTH EVERY DAY   LORATADINE (CLARITIN) 10 MG TABLET    Take 10 mg by mouth daily.   MAGNESIUM OXIDE (MAG-OX) 400 MG TABLET    Take 400 mg by mouth daily.   MENTHOL (ICY HOT) 5 % PTCH    Apply 1 patch topically as needed (knee pain).   MULTIPLE VITAMIN (MULTIVITAMIN WITH MINERALS) TABS TABLET    Take 1 tablet by mouth daily.   ONETOUCH DELICA LANCETS 33G MISC    by Does not apply route.   PANTOPRAZOLE (PROTONIX) 40 MG TABLET    TAKE 1 TABLET BY MOUTH EVERY DAY   PROBIOTIC PRODUCT (PROBIOTIC DAILY PO)    Take by mouth.   RYBELSUS 14 MG TABS    TAKE 1 TABLET BY MOUTH EVERY DAY 30 MIN BEFORE BREAKFAST/MEAL ON AN EMPTY STOMACH W/ 4 OUNCES WATER   SODIUM CHLORIDE (OCEAN) 0.65 % SOLN NASAL SPRAY    Place 2 sprays into both nostrils every 2 (two) hours while awake.   TADALAFIL (CIALIS) 20 MG TABLET    TAKE 1/2 TO 1 TABLET BY MOUTH AS NEEDED   TOUJEO SOLOSTAR 300 UNIT/ML SOPN    6  Units by Subdermal route daily.  Modified Medications   No medications on file  Discontinued Medications   No medications on file    Subjective: Frederick Abbott is in for his routine HIV follow-up visit.  He has not had any problems obtaining, taking or tolerating his Biktarvy and he rarely, if ever, misses a dose.  He has been working full-time through the pandemic they feel safe.  He has had more problems with his chronic anxiety and depression.  His sister died suddenly last 07-29-2023 of a spindle cell tumor.  A former classmate of his died of a heart attack and one of his dogs died last fall.  He has been seeing a Veterinary surgeon at work and finds it is helpful.  Review of Systems: Review of Systems  Constitutional: Negative for weight loss.  Respiratory: Negative for cough and shortness of breath.   Cardiovascular: Negative for chest pain.  Genitourinary:       Last fall he had an episode of red, raw areas on his glans penis.  Psychiatric/Behavioral: Positive for depression. The patient is nervous/anxious.     Past  Medical History:  Diagnosis Date  . Anxiety   . Belching   . Cholecystitis chronic   . Depression   . Diarrhea    "comes and goes" (12/14/2017)  . GERD (gastroesophageal reflux disease)   . Hepatitis A   . Hepatitis B   . HIV (human immunodeficiency virus infection) (HCC) dx'd 12/18/1995  . Hypercholesterolemia   . Neuromuscular disorder (HCC)    neuropathy  . Neuropathy   . Pneumonia    childhood  . Seasonal allergies   . Seizures (HCC)    "as a child, none since age 83; sister had them too" (12/14/2017)  . Type II diabetes mellitus (HCC)     Social History   Tobacco Use  . Smoking status: Former Smoker    Packs/day: 0.80    Years: 10.00    Pack years: 8.00    Types: Cigarettes    Quit date: 05/06/2006    Years since quitting: 13.6  . Smokeless tobacco: Never Used  Substance Use Topics  . Alcohol use: Yes    Alcohol/week: 2.0 standard drinks    Types: 2 Cans of beer per week  . Drug use: Yes    Types: Marijuana    Comment: 12/14/2017 'last used 1 wk ago"    Family History  Problem Relation Age of Onset  . Cancer Mother        breast  . Cancer Maternal Grandmother        melanoma-nose  . Cancer Paternal Grandfather        melanoma-ear  . Colon cancer Neg Hx   . Esophageal cancer Neg Hx   . Rectal cancer Neg Hx   . Stomach cancer Neg Hx     Allergies  Allergen Reactions  . Phenytoin Other (See Comments)    Childhood pt unsure  . Metformin And Related Nausea And Vomiting    Health Maintenance  Topic Date Due  . OPHTHALMOLOGY EXAM  01/03/2019  . HEMOGLOBIN A1C  10/11/2019  . FOOT EXAM  12/16/2019  . TETANUS/TDAP  08/10/2023  . INFLUENZA VACCINE  Completed  . PNEUMOCOCCAL POLYSACCHARIDE VACCINE AGE 2-64 HIGH RISK  Completed  . HIV Screening  Completed    Objective:  Vitals:   12/17/19 0901  BP: (!) 136/97  Pulse: 80  Weight: 153 lb (69.4 kg)   Body mass index is 21.34 kg/m.  Physical  Exam Constitutional:      Comments: His weight is down 8  pounds over the last year.  He became tearful when talking about his sister.  Cardiovascular:     Rate and Rhythm: Normal rate and regular rhythm.     Heart sounds: No murmur.  Pulmonary:     Effort: Pulmonary effort is normal.     Breath sounds: Normal breath sounds.  Musculoskeletal:        General: No swelling or tenderness.  Skin:    Findings: No rash.  Neurological:     General: No focal deficit present.     Lab Results Lab Results  Component Value Date   WBC 7.3 12/03/2019   HGB 15.3 12/03/2019   HCT 46.0 12/03/2019   MCV 90.7 12/03/2019   PLT 264 12/03/2019    Lab Results  Component Value Date   CREATININE 1.06 12/03/2019   BUN 19 12/03/2019   NA 140 12/03/2019   K 4.4 12/03/2019   CL 104 12/03/2019   CO2 27 12/03/2019    Lab Results  Component Value Date   ALT 24 12/03/2019   AST 18 12/03/2019   GGT 51 06/16/2018   ALKPHOS 61 12/15/2018   BILITOT 0.4 12/03/2019    Lab Results  Component Value Date   CHOL 153 12/03/2019   HDL 43 12/03/2019   LDLCALC 82 12/03/2019   LDLDIRECT 92.0 12/20/2017   TRIG 186 (H) 12/03/2019   CHOLHDL 3.6 12/03/2019   Lab Results  Component Value Date   LABRPR NON-REACTIVE 12/03/2019   HIV 1 RNA Quant (copies/mL)  Date Value  12/03/2019 <20 NOT DETECTED  11/22/2018 <20 NOT DETECTED  10/20/2017 32 (H)   CD4 T Cell Abs (/uL)  Date Value  12/03/2019 868  11/22/2018 990  10/20/2017 1,100     Problem List Items Addressed This Visit      High   Human immunodeficiency virus (HIV) disease ([place])    His infection remains under excellent, long-term control.  He will continue Biktarvy and follow-up here after lab work in 1 year.      Relevant Orders   CBC   T-helper cell (CD4)- ([facility] clinic only)   Comprehensive metabolic panel   Lipid panel   RPR   HIV-1 RNA quant-no reflex-bld     Unprioritized   Anxiety    His chronic anxiety and depression has been exacerbated by an acute grief reaction.  Encouraged him to  continue with his counseling.           Michel Bickers, MD [facility] [phone] pager   [phone] cell 12/17/2019, 9:19 AM

## 2019-12-20 ENCOUNTER — Ambulatory Visit (INDEPENDENT_AMBULATORY_CARE_PROVIDER_SITE_OTHER): Payer: PRIVATE HEALTH INSURANCE | Admitting: Family Medicine

## 2019-12-20 ENCOUNTER — Encounter: Payer: Self-pay | Admitting: Family Medicine

## 2019-12-20 ENCOUNTER — Other Ambulatory Visit: Payer: Self-pay

## 2019-12-20 VITALS — BP 126/86 | HR 76 | Temp 97.9°F | Resp 16 | Ht 71.0 in | Wt 157.2 lb

## 2019-12-20 DIAGNOSIS — Z125 Encounter for screening for malignant neoplasm of prostate: Secondary | ICD-10-CM

## 2019-12-20 DIAGNOSIS — G8929 Other chronic pain: Secondary | ICD-10-CM

## 2019-12-20 DIAGNOSIS — M25512 Pain in left shoulder: Secondary | ICD-10-CM

## 2019-12-20 DIAGNOSIS — E119 Type 2 diabetes mellitus without complications: Secondary | ICD-10-CM

## 2019-12-20 DIAGNOSIS — Z0000 Encounter for general adult medical examination without abnormal findings: Secondary | ICD-10-CM

## 2019-12-20 LAB — BASIC METABOLIC PANEL
BUN: 19 mg/dL (ref 6–23)
CO2: 25 mEq/L (ref 19–32)
Calcium: 9.2 mg/dL (ref 8.4–10.5)
Chloride: 105 mEq/L (ref 96–112)
Creatinine, Ser: 1.19 mg/dL (ref 0.40–1.50)
GFR: 64.75 mL/min (ref >60.00)
Glucose, Bld: 184 mg/dL — ABNORMAL HIGH (ref 70–99)
Potassium: 4.4 mEq/L (ref 3.5–5.1)
Sodium: 139 mEq/L (ref 135–145)

## 2019-12-20 LAB — HEPATIC FUNCTION PANEL
ALT: 25 U/L (ref 0–53)
AST: 20 U/L (ref 0–37)
Albumin: 4.3 g/dL (ref 3.5–5.2)
Alkaline Phosphatase: 68 U/L (ref 39–117)
Bilirubin, Direct: 0.1 mg/dL (ref 0.0–0.3)
Total Bilirubin: 0.4 mg/dL (ref 0.2–1.2)
Total Protein: 6.3 g/dL (ref 6.0–8.3)

## 2019-12-20 LAB — CBC WITH DIFFERENTIAL/PLATELET
Basophils Absolute: 0 10*3/uL (ref 0.0–0.1)
Basophils Relative: 0.4 % (ref 0.0–3.0)
Eosinophils Absolute: 0.3 10*3/uL (ref 0.0–0.7)
Eosinophils Relative: 3.6 % (ref 0.0–5.0)
HCT: 46.2 % (ref 39.0–52.0)
Hemoglobin: 15.5 g/dL (ref 13.0–17.0)
Lymphocytes Relative: 30.5 % (ref 12.0–46.0)
Lymphs Abs: 2.3 10*3/uL (ref 0.7–4.0)
MCHC: 33.5 g/dL (ref 30.0–36.0)
MCV: 92.5 fl (ref 78.0–100.0)
Monocytes Absolute: 0.7 10*3/uL (ref 0.1–1.0)
Monocytes Relative: 9.8 % (ref 3.0–12.0)
Neutro Abs: 4.2 10*3/uL (ref 1.4–7.7)
Neutrophils Relative %: 55.7 % (ref 43.0–77.0)
Platelets: 252 10*3/uL (ref 150.0–400.0)
RBC: 5 Mil/uL (ref 4.22–5.81)
RDW: 13.3 % (ref 11.5–15.5)
WBC: 7.5 10*3/uL (ref 4.0–10.5)

## 2019-12-20 LAB — TSH: TSH: 2.47 u[IU]/mL (ref 0.35–4.50)

## 2019-12-20 LAB — LIPID PANEL
Cholesterol: 150 mg/dL (ref 0–200)
HDL: 45 mg/dL (ref >39.00)
LDL Cholesterol: 68 mg/dL (ref 0–99)
NonHDL: 105.42
Total CHOL/HDL Ratio: 3
Triglycerides: 187 mg/dL — ABNORMAL HIGH (ref 0.0–149.0)
VLDL: 37.4 mg/dL (ref 0.0–40.0)

## 2019-12-20 LAB — PSA: PSA: 0.57 ng/mL (ref 0.10–4.00)

## 2019-12-20 LAB — HEMOGLOBIN A1C: Hgb A1c MFr Bld: 6.6 % — ABNORMAL HIGH (ref 4.6–6.5)

## 2019-12-20 NOTE — Patient Instructions (Signed)
Follow up in 6 months to recheck BP and cholesterol We'll notify you of your lab results and make any changes if needed Call and schedule your eye exam We'll call you with your Sports Med appt for the shoulder Keep up the good work!  You look great! Call with any questions or concerns Stay Safe!  Stay Healthy!

## 2019-12-20 NOTE — Progress Notes (Signed)
   Subjective:    Patient ID: Frederick Abbott, male    DOB: 02-21-1971, 49 y.o.   MRN: [id number]  HPI CPE- due for eye exam, foot exam.  UTD on immunizations.   Review of Systems Patient reports no vision/hearing changes, anorexia, fever ,adenopathy, persistant/recurrent hoarseness, swallowing issues, chest pain, palpitations, edema, persistant/recurrent cough, hemoptysis, dyspnea (rest,exertional, paroxysmal nocturnal), gastrointestinal  bleeding (melena, rectal bleeding), abdominal pain, excessive heart burn, GU symptoms (dysuria, hematuria, voiding/incontinence issues) syncope, focal weakness, memory loss, numbness & tingling, skin/hair/nail changes, depression, anxiety, abnormal bruising/bleeding.  L shoulder pain- started this fall when doing push ups.  Pain is not constant but worse w/ certain motions.     This visit occurred during the SARS-CoV-2 public health emergency.  Safety protocols were in place, including screening questions prior to the visit, additional usage of staff PPE, and extensive cleaning of exam room while observing appropriate contact time as indicated for disinfecting solutions.       Objective:   Physical Exam General Appearance:    Alert, cooperative, no distress, appears stated age  Head:    Normocephalic, without obvious abnormality, atraumatic  Eyes:    PERRL, conjunctiva/corneas clear, EOM's intact, fundi    benign, both eyes       Ears:    Normal TM's and external ear canals, both ears  Nose:   Deferred due to COVID  Throat:   Neck:   Supple, symmetrical, trachea midline, no adenopathy;       thyroid:  No enlargement/tenderness/nodules  Back:     Symmetric, no curvature, ROM normal, no CVA tenderness  Lungs:     Clear to auscultation bilaterally, respirations unlabored  Chest wall:    No tenderness or deformity  Heart:    Regular rate and rhythm, S1 and S2 normal, no murmur, rub   or gallop  Abdomen:     Soft, non-tender, bowel sounds active all  four quadrants,    no masses, no organomegaly  Genitalia:    Deferred  Rectal:    Extremities:   Extremities normal, atraumatic, no cyanosis or edema  Pulses:   2+ and symmetric all extremities  Skin:   Skin color, texture, turgor normal, no rashes or lesions  Lymph nodes:   Cervical, supraclavicular, and axillary nodes normal  Neurologic:   CNII-XII intact. Normal strength, sensation and reflexes      throughout          Assessment & Plan:  L shoulder pain- new.  Pain w/ certain arm movements.  Refer to sports med  DM- chronic problem.  Following w/ Endo.  Due for eye exam.  Foot exam done today.

## 2019-12-20 NOTE — Assessment & Plan Note (Signed)
Pt's PE.  UTD on immunizations.  Due for eye exam- pt to schedule.  Foot exam done today.  Check labs.  Anticipatory guidance provided.

## 2019-12-24 ENCOUNTER — Encounter: Payer: Self-pay | Admitting: Internal Medicine

## 2019-12-26 ENCOUNTER — Encounter: Payer: Self-pay | Admitting: Family Medicine

## 2019-12-26 ENCOUNTER — Ambulatory Visit: Payer: PRIVATE HEALTH INSURANCE | Admitting: Family Medicine

## 2019-12-27 ENCOUNTER — Encounter: Payer: Self-pay | Admitting: Registered Nurse

## 2019-12-27 ENCOUNTER — Telehealth: Payer: Self-pay | Admitting: Registered Nurse

## 2019-12-27 ENCOUNTER — Telehealth: Payer: Self-pay | Admitting: General Practice

## 2019-12-27 MED ORDER — ALPRAZOLAM 0.5 MG PO TABS: 0.5000 mg | ORAL_TABLET | Freq: Two times a day (BID) | ORAL | 1 refills | Status: DC | PRN

## 2019-12-27 MED ORDER — ONDANSETRON HCL 4 MG PO TABS: 4.0000 mg | ORAL_TABLET | Freq: Three times a day (TID) | ORAL | 0 refills | Status: DC | PRN

## 2019-12-27 NOTE — Telephone Encounter (Signed)
Per last Mychart message called pt, he was having a hard time talking on the phone he said due to severely sore throat. He stated he had been drinking plenty of fluids. I advised that he should go to the ER due to the amount of discomfort he is feeling in his throat. He said he wanted to see if it got any better first. I reiterated the PCP warnings of how he can slide quickly with diabetes. He stated an understanding.

## 2019-12-27 NOTE — Telephone Encounter (Signed)
Contacted by HR Tim patient called out sick from work after leaving early Tuesday due to upset stomach.  Patient has history of "diabetes stomach" where more acidy upon wake up may vomit in am upon awakening once feels better until the next morning for a couple days (2-3) then resolves spontaneously.  This is different in that patient had hourly emesis yesterday bile green colored.  He thought he had food poisoning at first.  He would drink pedialyte/powerade/gatorade between vomiting to try and maintain hydration and sent message to [facility] who sent in Rx for zofran 4mg  po TID prn for him but he has not picked up from the pharmacy yet today.  PCM encouraged hydration and to get evaluation/worried about diabetes getting out of control without tolerating po intake and dehydration.   Last night he was hungry and ate around 1700 and kept it down then went to bed feeling fine after chicken soup and jello.  Hasn't taken all of his usual medication due to vomiting afraid it would just come back out right away  Utilizes freestyle libre to monitor blood sugars. Previous 24 hours readings 110-200 His 90 day average 140.  Last night at midnight 200.  Patient reported he had urinated 1 hour ago thinks urine was clear but will try again now so we know.  Clear yellow per patient when he came back to the phone.  Patient denied fever/chills/diarrhea/loss of taste or smell/runny nose/cough/shortness of breath/dyspnea.  I have been feeling hot but every time I check my temperature it is normal.  Patient reported had surgical removal appendix 2019 and gallbladder removed prior to that.  Denied PMHx diverticulitis/hernia/kidney stones.  Patient reported he has kept down clear fluids today.  Denied blood bright red or black coffee grounds in emesis.  Sore throat and abdominal muscles today.  Discussed with patient acid stomach irritated esophagus and I would expect diaphragm to be sore from that frequent of vomiting. Discussed ER  precautions loss of consciousness, syncope/confusion/dizzyness with position changes, unable to void every 6-8 hours, blood in emesis red or black, abdomen pain worsening, unable to tolerate po intake to stay hydrated (tea/cola colored or green urine) or blood sugars continuing to increase and sustained over 200/unable to keep his medications down.  Discussed with patient some individuals initial symptoms of covid is stomach upset.  If vomiting continues tomorrow I do not recommend returning to work.  Patient planning to send partner to pharmacy today to pick up zofran and start using.  He has diet ginger ale/pedialyte/powerade/gatorade at home and will continue to try sipping.   I have recommended clear fluids today and if no further vomiting tonight advance to bland diet tomorrow.  Avoid dairy/spicy, fried and large portions of meat while having nausea.  If vomiting hold po intake x 1 hour.  Then sips clear fluids like broths, ginger ale, power ade, gatorade, pedialyte may advance to soft/bland if no vomiting x 24 hours and appetite returned otherwise hydration main focus.  zofran 4mg  po TID prn n/v per [facility]  RN Hildred Alamin or I will be calling him again tomorrow.  Discussed with patient mildly dehydrated today probable.  Discussed I cannot write work excuse notes with patient.  Discussed with patient we have noted a gastroenteritis in community that is negative on covid testing also and this could be an episode of that, covid or something else.  Discussed dehydration with diabetes can lead to elevated blood sugars/acute renal injury and if unable to stay hydrated I  recommend he is seen UC/ER where they could give IV fluids same day.  Patient verbalized agreement and understanding of treatment plan and had no further questions at this time. Total duration patient call 19 minutes.  Partner with patient in home/employer closed worksite today for onsite and remote workers.  Patient voice raspy A&Ox3 respirations even and  unlabored spoke full sentences without difficulty.  No cough, no nasal clearing/throat clearing heard during call.

## 2019-12-28 ENCOUNTER — Encounter: Payer: Self-pay | Admitting: Family Medicine

## 2019-12-28 MED ORDER — FENOFIBRATE 160 MG PO TABS: 160.0000 mg | ORAL_TABLET | Freq: Every day | ORAL | 1 refills | Status: DC

## 2019-12-28 NOTE — Telephone Encounter (Signed)
Spoke with pt over phone at 1000 this morning. Voice is raspy/hoarse. He sts unchanged for last 3 days.  Nausea still present. No vomiting this morning. Last episode yesterday afternoon 1-2pm. Zofran last dose last night at 2200. Soup and crackers for dinner last night which he was ale to tolerate. CBG right now 164 but was around 100 from 0100 to 0700 this morning. Started taking his diabetes meds this morning again. Skipped them the last 2 days due to sx. Has still been hydrating with water and pedialyte and keeping urine pale yellow. Will send message to NP to confirm that quarantine is not indicated at this time since vomiting has resolved after 24 hours and none in past 18-20 hours. No one else in home is sick.

## 2019-12-28 NOTE — Telephone Encounter (Signed)
After speaking to NP Inetta Fermo regarding pt's status, decision made not to quarantine for Covid and he can RTW tomorrow. Pt denies any new or additional sx that could be covid related. 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 Stated RN will inform HR of no quarantine and that he can return on next scheduled workday of 2/22. Advised pt to contact clinic if any new sx or sx change/recur over the weekend prior to his return. Pt verbalizes understanding and agreement with plan of care. No further questions/concerns.

## 2019-12-29 NOTE — Telephone Encounter (Signed)
Noted agreed with plan of care.  I will contact patient on Sunday 12/30/2019 to ensure no change in symptoms prior to RTW on 12/30/2019

## 2019-12-30 NOTE — Telephone Encounter (Signed)
Patient contacted via telephone for symptom update.  Patient reported his appetite fully returned nausea resolved.  Still just having raspy voice/sore throat unchanged after vomiting earlier last week.  "I had a good weekend"  "My blood sugars returned back to normal I checked 10 minutes ago and level 112."  Patient denied fever/chills/loss of taste/smell/cough/trouble breathing/diarrhea.  Patient feels ready to return to work and had no further questions or concerns at this time.  Patient spoke full sentences without difficulty; voice less raspy today but still gravely; no cough heard on telephone call A&Ox3 respirations even and unlabored.  Telephone call duration 2 minutes.

## 2019-12-31 ENCOUNTER — Ambulatory Visit: Payer: Self-pay

## 2019-12-31 ENCOUNTER — Ambulatory Visit: Payer: PRIVATE HEALTH INSURANCE | Admitting: Family Medicine

## 2019-12-31 ENCOUNTER — Encounter: Payer: Self-pay | Admitting: Family Medicine

## 2019-12-31 ENCOUNTER — Other Ambulatory Visit: Payer: Self-pay

## 2019-12-31 VITALS — BP 110/70 | HR 97 | Ht 71.0 in | Wt 152.8 lb

## 2019-12-31 DIAGNOSIS — M25512 Pain in left shoulder: Secondary | ICD-10-CM

## 2019-12-31 NOTE — Progress Notes (Signed)
   Subjective:    I'm seeing this patient as a consultation for:  Dr. Beverely Low. Note will be routed back to referring provider/PCP.  CC: L shoulder pain  I, Molly Weber, LAT, ATC, am serving as scribe for Dr. Clementeen Graham.  HPI: Pt is a 49 y/o male presenting w/ c/o L superior shoulder pain x several months after doing push-ups w/ a new push-up device.  He rates his L shoulder pain at a 1/10 and describes his pain as tension.  He works in a [employer] and does lots of lifting.  He notes typically the pain will occur with lifting above the level of the shoulder with heavy weights weighing more than 30 to 50 pounds.  He does not have much pain at sleep.  Radiating pain: No Mechanical L shoulder symptoms: No Numbness/tingling: No L UE weakness: No Aggravating factors: Resisted overhead motions; functional IR Treatments tried: Not really  Past medical history, Surgical history, Family history, Social history, Allergies, and medications have been entered into the medical record, reviewed.   Review of Systems: No new headache, visual changes, nausea, vomiting, diarrhea, constipation, dizziness, abdominal pain, skin rash, fevers, chills, night sweats, weight loss, swollen lymph nodes, body aches, joint swelling, muscle aches, chest pain, shortness of breath, mood changes, visual or auditory hallucinations.   Objective:    Vitals:   12/31/19 1515  BP: 110/70  Pulse: 97  SpO2: 98%   General: Well Developed, well nourished, and in no acute distress.  Neuro/Psych: Alert and oriented x3, extra-ocular muscles intact, able to move all 4 extremities, sensation grossly intact. Skin: Warm and dry, no rashes noted.  Respiratory: Not using accessory muscles, speaking in full sentences, trachea midline.  Cardiovascular: Pulses palpable, no extremity edema. Abdomen: Does not appear distended. MSK: Left shoulder normal-appearing Normal motion. Tender palpation mildly at [facility] joint. Intact strength  abduction external and internal rotation. Negative Hawkins and Neer's test. Negative empty can test. Minimally positive crossover arm compression test. Negative O'Brien test.   Lab and Radiology Results Diagnostic Limited MSK Ultrasound of: Left shoulder. AC joint mild effusion mild narrowing. Biceps tendon intact in bicipital groove. Subscapularis tendon normal-appearing. Supraspinatus tendon normal-appearing however some muscle intrusion into tendon seen at posterior aspects on ultrasound examination.  No obvious tear visible. Infraspinatus tendon normal-appearing. Impression: Mild AC joint effusion   Impression and Recommendations:    Assessment and Plan: 49 y.o. male with left shoulder pain.  Pain seems to be predominantly located in the [facility] joint.  Fortunately has excellent strength and range of motion.  Discussed options.  Given his symptoms are relatively mild we will proceed with some conservative management including trial Voltaren gel at home exercise program is reviewed in clinic by ATC.  If not improving will proceed with trial physical therapy and would consider injection and further imaging..   Orders Placed This Encounter  Procedures  . [place] LIMITED JOINT SPACE STRUCTURES UP LEFT(NO LINKED CHARGES)    Order Specific Question:   Reason for Exam (SYMPTOM  OR DIAGNOSIS REQUIRED)    Answer:   L shoulder pain    Order Specific Question:   Preferred imaging location?    Answer:   [facility]   No orders of the defined types were placed in this encounter.   Discussed warning signs or symptoms. Please see discharge instructions. Patient expresses understanding.   The above documentation has been reviewed and is accurate and complete Clementeen Graham

## 2019-12-31 NOTE — Patient Instructions (Signed)
Thank you for coming in today. Do the exercises we reviewed.  Use topical voltaren gel up to 4x daily If not better recheck. Next steps are injection and xray and perhaps physical therapy.  The shoulder will let you know if is getting worse.

## 2020-01-29 ENCOUNTER — Telehealth: Payer: Self-pay

## 2020-01-29 NOTE — Telephone Encounter (Signed)
Prior authorization initiated for patient's Biktarvy. Awaiting response.   Frederick Abbott R Ellenore Roscoe, RN  

## 2020-01-31 ENCOUNTER — Other Ambulatory Visit: Payer: Self-pay

## 2020-01-31 ENCOUNTER — Other Ambulatory Visit: Payer: Self-pay | Admitting: Family Medicine

## 2020-01-31 DIAGNOSIS — B20 Human immunodeficiency virus [HIV] disease: Secondary | ICD-10-CM

## 2020-01-31 MED ORDER — BIKTARVY 50-200-25 MG PO TABS: 1.0000 | ORAL_TABLET | Freq: Every day | ORAL | 0 refills | Status: DC

## 2020-02-01 ENCOUNTER — Telehealth: Payer: Self-pay

## 2020-02-01 ENCOUNTER — Other Ambulatory Visit: Payer: Self-pay

## 2020-02-01 DIAGNOSIS — B20 Human immunodeficiency virus [HIV] disease: Secondary | ICD-10-CM

## 2020-02-01 MED ORDER — BIKTARVY 50-200-25 MG PO TABS: 1.0000 | ORAL_TABLET | Freq: Every day | ORAL | 3 refills | Status: DC

## 2020-02-01 NOTE — Telephone Encounter (Signed)
Rx Benefits has approved Biktarvy from 01-30-2020 through 01-28-2021 . Fax received .  Sent for scanning  Laurell Josephs, RN

## 2020-02-01 NOTE — Telephone Encounter (Signed)
Biktarvy approved and sent to pharmacy.   Laurell Josephs, RN

## 2020-02-04 ENCOUNTER — Telehealth: Payer: Self-pay | Admitting: Pharmacy Technician

## 2020-02-04 NOTE — Telephone Encounter (Signed)
[facility] Patient Advocate Encounter  Completed and sent [employer] Advancing Access application for Biktarvy for this patient who is uninsured.    Patient is approved 01/31/2020 for up to 12 months.  BIN      [id number] PCN    [id number] GRP    [id number] ID        [id number]   Kathie Rhodes E. Dimas Aguas CPhT [facility] Patient [facility] Phone: [phone] Fax:  [phone]

## 2020-02-05 ENCOUNTER — Encounter: Payer: Self-pay | Admitting: Family Medicine

## 2020-02-13 ENCOUNTER — Other Ambulatory Visit: Payer: Self-pay | Admitting: Internal Medicine

## 2020-02-13 DIAGNOSIS — B20 Human immunodeficiency virus [HIV] disease: Secondary | ICD-10-CM

## 2020-02-16 ENCOUNTER — Other Ambulatory Visit: Payer: Self-pay | Admitting: Family Medicine

## 2020-03-20 ENCOUNTER — Other Ambulatory Visit: Payer: Self-pay

## 2020-03-20 DIAGNOSIS — B20 Human immunodeficiency virus [HIV] disease: Secondary | ICD-10-CM

## 2020-03-20 MED ORDER — BIKTARVY 50-200-25 MG PO TABS: 1.0000 | ORAL_TABLET | Freq: Every day | ORAL | 3 refills | Status: DC

## 2020-05-13 ENCOUNTER — Other Ambulatory Visit: Payer: Self-pay | Admitting: Family Medicine

## 2020-05-29 ENCOUNTER — Encounter: Payer: Self-pay | Admitting: Registered Nurse

## 2020-05-29 ENCOUNTER — Ambulatory Visit: Payer: Self-pay | Admitting: Registered Nurse

## 2020-05-29 ENCOUNTER — Other Ambulatory Visit: Payer: Self-pay

## 2020-05-29 VITALS — BP 130/73 | HR 73 | Temp 97.9°F

## 2020-05-29 DIAGNOSIS — M7652 Patellar tendinitis, left knee: Secondary | ICD-10-CM

## 2020-05-29 DIAGNOSIS — L97521 Non-pressure chronic ulcer of other part of left foot limited to breakdown of skin: Secondary | ICD-10-CM

## 2020-05-29 DIAGNOSIS — S86911A Strain of unspecified muscle(s) and tendon(s) at lower leg level, right leg, initial encounter: Secondary | ICD-10-CM

## 2020-05-29 DIAGNOSIS — M7651 Patellar tendinitis, right knee: Secondary | ICD-10-CM

## 2020-05-29 DIAGNOSIS — E08621 Diabetes mellitus due to underlying condition with foot ulcer: Secondary | ICD-10-CM

## 2020-05-29 MED ORDER — BACITRACIN-NEOMYCIN-POLYMYXIN 400-5-5000 EX OINT
1.0000 | TOPICAL_OINTMENT | Freq: Two times a day (BID) | CUTANEOUS | 0 refills | Status: AC
Start: 2020-05-29 — End: 2020-06-12

## 2020-05-29 MED ORDER — ACETAMINOPHEN 500 MG PO TABS: 1000.0000 mg | ORAL_TABLET | Freq: Four times a day (QID) | ORAL | 0 refills | Status: AC | PRN

## 2020-05-29 MED ORDER — CEPHALEXIN 500 MG PO CAPS: 500.0000 mg | ORAL_CAPSULE | Freq: Two times a day (BID) | ORAL | 0 refills | Status: AC

## 2020-05-29 NOTE — Progress Notes (Signed)
Subjective:    Patient ID: Frederick Abbott, male    DOB: March 02, 1971, 49 y.o.   MRN: [id number]  49y/o Caucasian established male pt c/o R lateral knee pain that started acutely yesterday while moving pallets at work. It improved through the evening but he noticed it worsening again this morning at work. Feels tight and pulling over lateral aspect of knee just below patellar level. Denied locking/popping/giving out/swelling. Did note limping at home after work.  Picking up pallets today worsened right knee pain at work.  Patient did not he had 18000 steps on day knee started hurting which is more than his typical day at work also.   Took motrin today helped some.  Patient also reports sore between his toes that he would like checked since diabetic noticed it this week and has been applying neosporin OTC.     Review of Systems  Constitutional: Negative for activity change, appetite change, chills, diaphoresis, fatigue and fever.  HENT: Negative for trouble swallowing and voice change.   Eyes: Negative for discharge and itching.  Respiratory: Negative for cough, choking, shortness of breath, wheezing and stridor.   Cardiovascular: Negative for chest pain.  Gastrointestinal: Negative for diarrhea, nausea and vomiting.  Endocrine: Negative for cold intolerance and heat intolerance.  Genitourinary: Negative for difficulty urinating.  Musculoskeletal: Positive for arthralgias, gait problem and myalgias. Negative for back pain, joint swelling, neck pain and neck stiffness.  Skin: Positive for color change and rash. Negative for pallor.  Allergic/Immunologic: Positive for environmental allergies and immunocompromised state. Negative for food allergies.  Neurological: Negative for dizziness, tremors, seizures, syncope, facial asymmetry, speech difficulty, weakness, light-headedness, numbness and headaches.  Hematological: Negative for adenopathy. Does not bruise/bleed easily.   Psychiatric/Behavioral: Negative for agitation, confusion and sleep disturbance.       Objective:   Physical Exam Vitals and nursing note reviewed.  Constitutional:      General: He is awake. He is not in acute distress.    Appearance: Normal appearance. He is well-developed, well-groomed and normal weight. He is not ill-appearing, toxic-appearing or diaphoretic.  HENT:     Head: Normocephalic and atraumatic.     Jaw: There is normal jaw occlusion.     Salivary Glands: Right salivary gland is not diffusely enlarged. Left salivary gland is not diffusely enlarged.     Right Ear: Hearing and external ear normal.     Left Ear: Hearing and external ear normal.     Nose: Nose normal. No congestion or rhinorrhea.     Mouth/Throat:     Mouth: No angioedema.     Pharynx: Oropharynx is clear.  Eyes:     General: Lids are normal. Vision grossly intact. Gaze aligned appropriately. No allergic shiner, visual field deficit or scleral icterus.       Right eye: No discharge.        Left eye: No discharge.     Extraocular Movements: Extraocular movements intact.     Conjunctiva/sclera: Conjunctivae normal.     Pupils: Pupils are equal, round, and reactive to light.  Neck:     Trachea: Trachea normal.  Cardiovascular:     Rate and Rhythm: Normal rate and regular rhythm.     Pulses: Normal pulses.          Radial pulses are 2+ on the right side and 2+ on the left side.       Dorsalis pedis pulses are 2+ on the left side.  Posterior tibial pulses are 2+ on the left side.  Pulmonary:     Effort: Pulmonary effort is normal. No respiratory distress.     Breath sounds: Normal breath sounds and air entry. No stridor, decreased air movement or transmitted upper airway sounds. No decreased breath sounds, wheezing, rhonchi or rales.     Comments: Wearing cloth mask due to covid 19 pandemic; spoke full sentences without difficulty; no cough observed in exam room Abdominal:     General: Abdomen is  flat.  Musculoskeletal:        General: Tenderness and signs of injury present. No swelling. Normal range of motion.     Right shoulder: Normal.     Left shoulder: Normal.     Right elbow: Normal.     Left elbow: Normal.     Right hand: Normal.     Left hand: Normal.     Cervical back: Normal, normal range of motion and neck supple. No swelling, edema, deformity, erythema, signs of trauma, lacerations, rigidity, torticollis or crepitus. No pain with movement. Normal range of motion.     Thoracic back: Normal.     Lumbar back: Normal.     Right hip: Normal.     Left hip: Normal.     Right knee: Crepitus present. No swelling, deformity, effusion, erythema, ecchymosis, lacerations or bony tenderness. Normal range of motion. Tenderness present over the LCL. No medial joint line, lateral joint line, MCL, ACL, PCL or patellar tendon tenderness. No LCL laxity, MCL laxity, ACL laxity or PCL laxity. Normal alignment and normal meniscus. Normal pulse.     Instability Tests: Anterior drawer test negative. Posterior drawer test negative. Anterior Lachman test negative. Medial McMurray test negative and lateral McMurray test negative.     Left knee: Crepitus present. No swelling, deformity, effusion, erythema, ecchymosis, lacerations or bony tenderness. Normal range of motion. No tenderness. No medial joint line, lateral joint line, MCL, LCL, ACL or PCL tenderness. No LCL laxity, MCL laxity, ACL laxity or PCL laxity.Normal alignment and normal meniscus. Normal pulse.     Instability Tests: Anterior drawer test negative. Posterior drawer test negative. Anterior Lachman test negative. Medial McMurray test negative and lateral McMurray test negative.     Right lower leg: No edema.     Left lower leg: No edema.     Left foot: Normal range of motion. No deformity, bunion, Charcot foot, foot drop or prominent metatarsal heads.       Feet:     Comments: Bilateral patellar tendons with crepitus with  AROM/flexion/extension; bilateral negative mcmurray/lachmann's/anterior/posterior drawer/valgus/varus tests; TTP right LCL to IT band; no patellar displacement apprehension bilaterally but crepitus with patellar displacement right;   Feet:     Left foot:     Skin integrity: Ulcer, skin breakdown, erythema, warmth, callus and dry skin present. No blister or fissure.     Toenail Condition: Left toenails are normal.     Comments: 0.27mm diameter macular erythema 5th toe left; no fluctuance or induration; callous and dry skin noted over MTP joints laterally and over calcaneous Lymphadenopathy:     Head:     Right side of head: No submental, submandibular, tonsillar or preauricular adenopathy.     Left side of head: No submental, submandibular, tonsillar or preauricular adenopathy.     Cervical: No cervical adenopathy.     Right cervical: No superficial cervical adenopathy.    Left cervical: No superficial cervical adenopathy.  Skin:    General: Skin is warm and  dry.     Capillary Refill: Capillary refill takes less than 2 seconds.     Coloration: Skin is not ashen, cyanotic, jaundiced, mottled, pale or sallow.     Findings: Abrasion, erythema, rash and wound present. No abscess, acne, bruising, burn, ecchymosis, laceration, lesion or petechiae. Rash is macular and papular. Rash is not crusting, nodular, purpuric, pustular, scaling, urticarial or vesicular.     Nails: There is no clubbing.     Comments: Left 5th toe medially between 4th and 5th digit erythema macular and central pinpoint scab dark; slightly TTP dry; callous and mild scaling noted over thickest callous bilateral feet; wearing lace up sneakers cloth/fabric  Neurological:     General: No focal deficit present.     Mental Status: He is alert and oriented to person, place, and time. Mental status is at baseline.     GCS: GCS eye subscore is 4. GCS verbal subscore is 5. GCS motor subscore is 6.     Cranial Nerves: Cranial nerves are  intact. No cranial nerve deficit, dysarthria or facial asymmetry.     Sensory: Sensation is intact. No sensory deficit.     Motor: Motor function is intact. No weakness, tremor, atrophy, abnormal muscle tone or seizure activity.     Coordination: Coordination is intact. Coordination normal.     Gait: Gait is intact. Gait normal.     Comments: Gait sure and steady in clinic/no limp noted; on/off exam table and in/out of chair without difficulty; bilateral extremity upper and lower strength equal/hand grasp equal 5/5  Psychiatric:        Attention and Perception: Attention and perception normal.        Mood and Affect: Mood and affect normal.        Speech: Speech normal.        Behavior: Behavior normal. Behavior is cooperative.        Thought Content: Thought content normal.        Cognition and Memory: Cognition and memory normal.        Judgment: Judgment normal.     Patient seen 2 hours after knee sleeve applied and he reported that it was helping to decrease knee discomfort when visiting his workcenter.      Assessment & Plan:  A-bilateral patellar tendonitis acute initial visit, knee strain right acute initial visit, diabetic ulcer left 5th toe limited to skin breakdown, cellulitis toe left initial visit  P-Discussed stretches, ice, tylenol 1000mg  po QID prn pain, biofreeze topical may apply QID prn.  Discussed motrin can increase blood levels of biktarvy per [employer] or increase risk of nephrotoxity with rybelsus/renal impairment with jardiance and counteract his blood pressure medications.  Recommended tylenol as front line pain medication.  Cryotherapy 15 minutes QID prn pain/swelling.  Patient reported he has reusable ice pack at home.  Avoid repetitive lunges/squats/steps/ladders/jumping/running as increased strain on knees.  Fitted and distributed knee neoprene sleeve small from clinic stock to patient.  Discussed care and wear of brace.  Do not put in washing machine or dryer.   Hand wash and air dry.  May use blowdryer to speed drying process.  Follow up in 2 weeks prn knee pain not improving/resolved.   Medications as directed. Call or return to clinic as needed if these symptoms worsen or fail to improve as anticipated. Trial home exercise program per [employer] hand[employer].  Discussed neoprene sleeve use.  Patient verbalized agreement and understanding of treatment plan and had no further questions at this  time.  [employer] handout on knee sprain, lateral collateral ligament knee sprain, patellar tendonitis and rehab exercises given to patient.  Patient felt he could continue work duties.  If requires work restrictions will need to see [facility] provider as contract limitations prohibit me from prescribing work restrictions. P2: ROM exercises, Stretching, and Hand out given   Continue neosporin/triple antibiotic topical BID application to toe and daily foot exams for new or worsening symptoms/hot spots between toes or on feet and follow up for re-evaluation if new or worsening spots noted.  Avoid tight fitting shoes.  Allow for toe wiggle room to prevent pressure ulcers  Wear socks in closed toed shoes.  Start keflex 500mg  po BID x 7 days #30 RF0 dispensed from [facility] to patient.  Given telfa gauze to place between 4/5th toes.  Avoid bandaid if rubbing/irritating skin on affected or adjacent toes. Do not soak foot in dirty water until wound healed.  Avoid pool, lake, hot tub, river/pond water. May shower Keep wound covered with petrolatum based ointment and it will heal faster and prevent contamination rubbing from clothing tearing off scabs.  Given triple antibiotic and telfa pads from clinic.  Change telfa at least daily after washing with soap and water.  Change prn soiling. [employer] handout on diabetic foot care, cellulitis and pressure injury printed and given to patient. Medications as directed. Call or return to clinic as needed if these symptoms worsen or fail to improve as  anticipated. Patient verbalized agreement and understanding of treatment plan and had no further questions at this time. P2: ROM, injury prevention

## 2020-05-29 NOTE — Patient Instructions (Addendum)
Lateral Collateral Knee Ligament Sprain  The lateral collateral ligament (LCL) is a tough band of tissue that connects the thigh bone to the smaller of the lower leg bones. It is located on the outer side of the knee and helps keep the knee stable. An LCL sprain is a stretch or tear in the LCL. What are the causes? This condition may be caused by:  A hard, direct hit (trauma) to the outer side of your knee.  Running and changing directions quickly (cutting).  Twisting your knee forcefully. What increases the risk? The following factors may make you more likely to develop this condition:  Playing contact sports that involve cutting, such as football or soccer.  Participating in sports in which there is a risk of twisting the knee, like skiing or wrestling.  Having weak muscles.  Having poor coordination. What are the signs or symptoms? Symptoms of this condition include:  Pain on the outside of the knee.  A popping sound at the time of injury.  Swelling along the outside of the knee.  Bruising around the knee.  Feeling unstable when you stand, like your knee will give way.  Difficulty walking on uneven surfaces.  Numbness at the knee. How is this diagnosed? This condition may be diagnosed based on:  Your medical history.  A physical exam.  Imaging tests, such as an X-ray or MRI. During your physical exam, your health care provider will feel the side of your knee and check for stability by moving it. How is this treated? This condition may be treated by:  Resting the knee by keeping weight off of it until swelling and pain improve.  Raising (elevating) the knee above the level of your heart. This helps to reduce swelling.  Icing the knee. This helps to reduce swelling.  Taking an NSAID, like ibuprofen. This helps to reduce pain and swelling.  Using a knee brace and crutches while the injury heals.  Using a knee brace when participating in athletic  activities.  Doing rehab exercises (physical therapy).  Surgery. This may be needed if: ? Your LCL tore all the way through. ? Your knee is unstable. ? Your knee is not getting better with other treatments. Follow these instructions at home: If you have a brace:  Wear it as told by your health care provider. Remove it only as told by your health care provider.  Loosen the brace if your toes tingle, become numb, or turn cold and blue.  Keep the brace clean.  If the brace is not waterproof: ? Do not let it get wet. ? Cover it with a watertight covering when you take a bath or shower. Managing pain, stiffness, and swelling   If directed, put ice on the outer side of your knee. ? If you have a removable brace, remove it as told by your health care provider. ? Put ice in a plastic bag. ? Place a towel between your skin and the bag. ? Leave the ice on for 20 minutes, 2-3 times a day.  Move your foot and toes often to reduce stiffness and swelling.  Elevate your knee above the level of your heart while you are sitting or lying down. Medicines  Take over-the-counter and prescription medicines only as told by your health care provider.  Ask your health care provider if the medicine prescribed to you: ? Requires you to avoid driving or using heavy machinery. ? Can cause constipation. You may need to take actions to prevent  or treat constipation, such as:  Drink enough fluid to keep your urine pale yellow.  Take over-the-counter or prescription medicines.  Eat foods that are high in fiber, such as beans, whole grains, and fresh fruits and vegetables.  Limit foods that are high in fat and processed sugars, such as fried or sweet foods. Activity  Ask your health care provider when it is safe to drive if you have a brace on your leg.  Return to your normal activities as told by your health care provider. Ask your health care provider what activities are safe for you.  Do  exercises as told by your health care provider.  Do not use the injured leg to support your body weight until your health care provider says that you can. Use crutches and a brace as told by your health care provider. General instructions  Do not use any products that contain nicotine or tobacco, such as cigarettes, e-cigarettes, and chewing tobacco. These can delay healing. If you need help quitting, ask your health care provider.  Keep all follow-up visits as told by your health care provider. This is important. How is this prevented?  Warm up and stretch before being active.  Cool down and stretch after being active.  Give your body time to rest between periods of activity.  Make sure to use equipment that fits you.  Be safe and responsible while being active. This will help you avoid falls.  Do at least 150 minutes of moderate-intensity exercise each week, such as brisk walking or water aerobics.  Maintain physical fitness, including: ? Strength. ? Flexibility. ? Cardiovascular fitness. ? Endurance. Contact a health care provider if:  You continue to have pain and swelling for 2-4 weeks.  Your symptoms get worse.  Your knee feels unstable or gives way. Summary  The lateral collateral ligament (LCL) is a tough band of tissue that connects the thigh bone to the smaller of the lower leg bones. An LCL sprain is a stretch or tear in the LCL.  This condition may be treated in several ways, including resting your knee, wearing a brace, taking medicines, and having surgery.  Do not use the injured leg to support your body weight until your health care provider says that you can. Use crutches and a brace as told by your health care provider.  Contact a health care provider if your symptoms do not improve in 2-4 weeks or your symptoms get worse.  Keep all follow-up visits as told by your health care provider. This is important. This information is not intended to replace advice  given to you by your health care provider. Make sure you discuss any questions you have with your health care provider. Document Revised: 06/14/2018 Document Reviewed: 06/14/2018 Elsevier Patient Education  2020 [employer] Knee Sprain, Adult  A knee sprain is a stretch or tear in a knee ligament. Knee ligaments are bands of tissue that connect bones in the knee to each other. What are the causes? This condition often results from:  A fall.  An injury to the knee. What are the signs or symptoms? Symptoms of this condition include:  Trouble bending the leg.  Swelling in the knee.  Bruising around the knee.  Tenderness or pain in the knee.  Muscle spasms around the knee. How is this diagnosed? This condition may be diagnosed based on:  A physical exam.  What happened just before you started to have symptoms.  Tests, including: ? An X-ray. This may  be done to make sure no bones are broken. ? An MRI. This may be done to check if the ligament is torn. ? Stress testing of the knee. This may be done to check ligament damage. How is this treated? Treatment for this condition may involve:  Keeping the knee still (immobilized) with a cast, brace, or splint.  Applying ice to the knee. This helps with pain and swelling.  Keeping the knee raised (elevated) above the level of your heart when you are resting. This helps with pain and swelling.  Taking medicine for pain.  Exercises to prevent or limit permanent weakness or stiffness in your knee.  Surgery to reconnect the ligament to the bone or to reconstruct it. This may be needed if the ligament tore all the way. Follow these instructions at home: If you have a splint or brace:  Wear the splint or brace as told by your health care provider. Remove it only as told by your health care provider.  Loosen the splint or brace if your toes tingle, become numb, or turn cold and blue.  Keep the splint or brace clean.  If the  splint or brace is not waterproof: ? Do not let it get wet. ? Cover it with a watertight covering when you take a bath or a shower. If you have a cast:  Do not stick anything inside the cast to scratch your skin. Doing that increases your risk of infection.  Check the skin around the cast every day. Tell your health care provider about any concerns.  You may put lotion on dry skin around the edges of the cast. Do not put lotion on the skin underneath the cast.  Keep the cast clean.  If the cast is not waterproof: ? Do not let it get wet. ? Cover it with a watertight covering when you take a bath or a shower. Managing pain, stiffness, and swelling   If directed, put ice on the injured area. ? If you have a removable splint or brace, remove it as told by your health care provider. ? Put ice in a plastic bag. ? Place a towel between your skin and the bag or between your cast and the bag. ? Leave the ice on for 20 minutes, 2-3 times a day.  Gently move your toes often to avoid stiffness and to lessen swelling.  Elevate the injured area above the level of your heart while you are sitting or lying down.  Take over-the-counter and prescription medicines only as told by your health care provider. General instructions  Do exercises as told by your health care provider.  Keep all follow-up visits as told by your health care provider. This is important. Contact a health care provider if:  You have pain that gets worse.  The cast, brace, or splint does not fit right.  The cast, brace, or splint gets damaged. Get help right away if:  You cannot use your injured joint to support any of your body weight (cannot bear weight).  You cannot move the injured joint.  You cannot walk more than a few steps without pain or without your knee buckling.  You have significant pain, swelling, or numbness below the cast, brace, or splint. This information is not intended to replace advice given  to you by your health care provider. Make sure you discuss any questions you have with your health care provider. Document Revised: 02/16/2019 Document Reviewed: 05/14/2016 Elsevier Patient Education  2020 [employer]. Patellar  Tendinitis Rehab Ask your health care provider which exercises are safe for you. Do exercises exactly as told by your health care provider and adjust them as directed. It is normal to feel mild stretching, pulling, tightness, or discomfort as you do these exercises. Stop right away if you feel sudden pain or your pain gets worse. Do not begin these exercises until told by your health care provider. Stretching and range-of-motion exercise This exercise warms up your muscles and joints and improves the movement and flexibility of your knee. The exercise also helps to relieve pain and stiffness. Hamstring, doorway stretch This is an exercise in which you lie in a doorway and prop your leg on a wall to stretch the back of your knee and thigh (hamstring). 1. Lie on your back in front of a doorway with your left / right leg resting against the wall and your other leg flat on the floor in the doorway. There should be a slight bend in your left / right knee. 2. Straighten your left / right knee. You should feel a stretch behind your knee or thigh. If you do not, scoot your buttocks closer to the door. 3. Hold this position for ____15_____ seconds. Repeat ___3_______ times. Complete this exercise _____3_____ times a day. Strengthening exercises These exercises build strength and endurance in your knee. Endurance is the ability to use your muscles for a long time, even after they get tired. Quadriceps, isometric This exercise stretches the muscles in front of your thigh (quadriceps) without moving your knee joint (isometric). 1. Lie on your back with your left / right leg extended and your other knee bent. 2. Slowly tense the muscles in the front of your left / right thigh. When you  do this, you should see your kneecap slide up toward your hip or see increased dimpling just above the knee. This motion will push the back of your knee toward the floor. If this is painful, try putting a rolled-up hand towel under your knee to support it in a bent position. Change the size of the towel to find a position that allows you to do this exercise without any pain. 3. For _____15_____ seconds, hold the muscle as tight as you can without increasing your pain. 4. Relax the muscles slowly and completely. Repeat ___3_______ times. Complete this exercise ____3______ times a day. Straight leg raises This exercise stretches the muscles in front of your thigh (quadriceps) and the muscles that move your hips (hip flexors). 1. Lie on your back with your left / right leg extended and your other knee bent. 2. Tense the muscles in the front of your left / right thigh. When you do this, you should see your kneecap slide up or see increased dimpling just above the knee. 3. Keep these muscles tight as you raise your leg 4-6 inches (10-15 cm) off the floor. Do not let your moving knee bend. 4. Hold this position for ____15______ seconds. 5. Keep these muscles tense as you slowly lower your leg. 6. Relax your muscles slowly and completely. Repeat _____3_____ times. Complete this exercise ____3______ times a day. Squats This is a weight-bearing exercise in which you bend your knees and lower your hips while engaging your thigh muscles. 1. Stand in front of a table, with your feet and knees pointing straight ahead. You may rest your hands on the table for balance but not for support. 2. Slowly bend your knees and lower your hips like you are going to sit  in a chair. ? Keep your weight over your heels, not over your toes. ? Keep your lower legs upright so they are parallel with the table legs. ? Do not let your hips go lower than your knees. ? Do not bend lower than told by your health care provider. ? If  your knee pain increases, do not bend as low. 3. Hold the squat position for _____15_____ seconds. 4. Slowly push with your legs to return to standing. Do not use your hands to pull yourself to standing. Repeat _____3_____ times. Complete this exercise ____3______ times a day. Step-downs This is an exercise in which you step down slowly while engaging your leg muscles. 1. Stand on the edge of a step. 2. Keeping your weight over your _____right_____ heel, slowly bend your _right_________ knee to bring your ____right______ heel toward the floor. Lower your heel as far as you can while keeping control and without increasing any discomfort. ? Do not let your ____right______ knee come forward. ? Use your leg muscles, not gravity, to lower your body. ? Hold a wall or rail for balance if needed. 3. Slowly push through your heel to lift your body weight back up. 4. Return to the starting position. Repeat ___3_______ times. Complete this exercise ____3______ times a day. Straight leg raises This exercise strengthens the muscles that rotate the leg at the hip and move it away from your body (hip abductors). 1. Lie on your side with your left / right leg in the top position. Lie so your head, shoulder, knee, and hip line up. You may bend your lower knee to help you keep your balance. 2. Roll your hips slightly forward, so that your hips are stacked directly over each other and your left / right knee is facing forward. 3. Leading with your heel, lift your top leg 4-6 inches (10-15 cm). You should feel the muscles in your outer hip lifting. ? Do not let your foot drift forward. ? Do not let your knee roll toward the ceiling. 4. Hold this position for ___15_______ seconds. 5. Slowly lower your leg to the starting position. 6. Let your muscles relax completely after each repetition. Repeat ____3______ times. Complete this exercise ___3_______ times a day. This information is not intended to replace advice  given to you by your health care provider. Make sure you discuss any questions you have with your health care provider. Document Revised: 02/15/2019 Document Reviewed: 08/15/2018 Elsevier Patient Education  2020 [employer] Patellar Tendinitis  Patellar tendinitis is also called jumper's knee or patellar tendinopathy. This condition happens when there is damage to and inflammation of the patellar tendon. Tendons are cord-like tissues that connect muscles to bones. The patellar tendon connects the bottom of the kneecap (patella) to the top of the shin bone (tibia). Patellar tendinitis causes pain in the front of the knee. The condition happens in the following stages:  Stage 1: In this stage, you have pain only after activity.  Stage 2: In this stage, you have pain during and after activity.  Stage 3: In this stage, you have pain at rest as well as during and after activity. The pain limits your ability to do the activity.  Stage 4: In this stage, the tendon tears and severely limits your activity. What are the causes? This condition is caused by repeated (repetitive) stress on the tendon. This stress may cause the tendon to stretch, swell, thicken, or tear. What increases the risk? The following factors may make you  more likely to develop this condition:  Participating in sports that involve running, kicking, and jumping, especially on hard surfaces. These include: ? Basketball. ? Volleyball. ? Soccer. ? Track and field.  Training too hard.  Having tight thigh muscles.  Having received steroid injections in the tendon.  Having had knee surgery.  Being [id number] years old.  Having rheumatoid arthritis, diabetes, or kidney disease. These conditions interrupt blood flow to the knee, causing the tendon to weaken. What are the signs or symptoms? The main symptom of this condition is pain and swelling in the front of the knee. The pain usually starts slowly and gradually gets worse. It  may become painful to straighten your leg. The pain may get worse when you walk, run, or jump. How is this diagnosed? This condition may be diagnosed based on:  Your symptoms.  Your medical history.  A physical exam. During the physical exam, your health care provider may check for: ? Tenderness in your patella. ? Tightness in your thigh muscles. ? Pain when you straighten your knee.  Imaging tests, including: ? X-rays. These will show the position and condition of your patella. ? An MRI. This will show any abnormality of the tendon. ? Ultrasound. This will show any swelling or other abnormalities of the tendon. How is this treated? Treatment for this condition depends on the stage of the condition. It may involve:  Avoiding activities that cause pain, such as jumping.  Icing and elevating your knee.  Having sound wave stimulation to promote healing.  Doing stretching and strengthening exercises (physical therapy) when pain and swelling improve.  Wearing a knee brace. This may be needed if your condition does not improve with treatment.  Using crutches or a walker. This may be needed if your condition does not improve with treatment.  Surgery. This may be done if you have stage 4 tendinitis. Follow these instructions at home: If you have a brace:  Wear the brace as told by your health care provider. Remove it only as told by your health care provider.  Loosen the brace if your toes tingle, become numb, or turn cold and blue.  Keep the brace clean.  If the brace is not waterproof: ? Do not let it get wet. ? Cover it with a watertight covering when you take a bath or shower.  Ask your health care provider when it is safe for you to drive. Managing pain, stiffness, and swelling   If directed, put ice on the injured area. ? If you have a removable brace, remove it as told by your health care provider. ? Put ice in a plastic bag. ? Place a towel between your skin and  the bag. ? Leave the ice on for 20 minutes, 2-3 times a day.  Move your toes often to reduce stiffness and swelling.  Raise (elevate) your knee above the level of your heart while you are sitting or lying down. Activity  Do not use the injured limb to support your body weight until your health care provider says that you can. Use your crutches or a walker as told by your health care provider.  Return to your normal activities as told by your health care provider. Ask your health care provider what activities are safe for you.  Do exercises as told by your health care provider or physical therapist. General instructions  Take over-the-counter and prescription medicines only as told by your health care provider.  Do not use any products that  contain nicotine or tobacco, such as cigarettes, e-cigarettes, and chewing tobacco. These can delay healing. If you need help quitting, ask your health care provider.  Keep all follow-up visits as told by your health care provider. This is important. How is this prevented?  Warm up and stretch before being active.  Cool down and stretch after being active.  Give your body time to rest between periods of activity. ? You may need to reduce how often you play a sport that requires frequent jumping.  Make sure to use equipment that fits you.  Be safe and responsible while being active. This will help you avoid falls which can damage the tendon.  Do at least 150 minutes of moderate-intensity exercise each week, such as brisk walking or water aerobics.  Maintain physical fitness, including: ? Strength. ? Flexibility. ? Cardiovascular fitness. ? Endurance. Contact a health care provider if:  Your symptoms have not improved in 6 weeks.  Your symptoms get worse. Summary  Patellar tendinitis is also called jumper's knee or patellar tendinopathy. This condition happens when there is damage to and inflammation of the patellar tendon.  Treatment  for this condition depends on the stage of the condition and may include rest, ice, exercises, medicines, and surgery.  Do not use the injured limb to support your body weight until your health care provider says that you can.  Take over-the-counter and prescription medicines only as told by your health care provider.  Keep all follow-up visits as told by your health care provider. This is important. This information is not intended to replace advice given to you by your health care provider. Make sure you discuss any questions you have with your health care provider. Document Revised: 02/15/2019 Document Reviewed: 09/18/2018 Elsevier Patient Education  2020 [employer] Cellulitis, Adult  Cellulitis is a skin infection. The infected area is usually warm, red, swollen, and tender. This condition occurs most often in the arms and lower legs. The infection can travel to the muscles, blood, and underlying tissue and become serious. It is very important to get treated for this condition. What are the causes? Cellulitis is caused by bacteria. The bacteria enter through a break in the skin, such as a cut, burn, insect bite, open sore, or crack. What increases the risk? This condition is more likely to occur in people who:  Have a weak body defense system (immune system).  Have open wounds on the skin, such as cuts, burns, bites, and scrapes. Bacteria can enter the body through these open wounds.  Are older than 50 years of age.  Have diabetes.  Have a type of long-lasting (chronic) liver disease (cirrhosis) or kidney disease.  Are obese.  Have a skin condition such as: ? Itchy rash (eczema). ? Slow movement of blood in the veins (venous stasis). ? Fluid buildup below the skin (edema).  Have had radiation therapy.  Use IV drugs. What are the signs or symptoms? Symptoms of this condition include:  Redness, streaking, or spotting on the skin.  Swollen area of the  skin.  Tenderness or pain when an area of the skin is touched.  Warm skin.  A fever.  Chills.  Blisters. How is this diagnosed? This condition is diagnosed based on a medical history and physical exam. You may also have tests, including:  Blood tests.  Imaging tests. How is this treated? Treatment for this condition may include:  Medicines, such as antibiotic medicines or medicines to treat allergies (antihistamines).  Supportive care,  such as rest and application of cold or warm cloths (compresses) to the skin.  Hospital care, if the condition is severe. The infection usually starts to get better within 1-2 days of treatment. Follow these instructions at home:  Medicines  Take over-the-counter and prescription medicines only as told by your health care provider.  If you were prescribed an antibiotic medicine, take it as told by your health care provider. Do not stop taking the antibiotic even if you start to feel better. General instructions  Drink enough fluid to keep your urine pale yellow.  Do not touch or rub the infected area.  Raise (elevate) the infected area above the level of your heart while you are sitting or lying down.  Apply warm or cold compresses to the affected area as told by your health care provider.  Keep all follow-up visits as told by your health care provider. This is important. These visits let your health care provider make sure a more serious infection is not developing. Contact a health care provider if:  You have a fever.  Your symptoms do not begin to improve within 1-2 days of starting treatment.  Your bone or joint underneath the infected area becomes painful after the skin has healed.  Your infection returns in the same area or another area.  You notice a swollen bump in the infected area.  You develop new symptoms.  You have a general ill feeling (malaise) with muscle aches and pains. Get help right away if:  Your  symptoms get worse.  You feel very sleepy.  You develop vomiting or diarrhea that persists.  You notice red streaks coming from the infected area.  Your red area gets larger or turns dark in color. These symptoms may represent a serious problem that is an emergency. Do not wait to see if the symptoms will go away. Get medical help right away. Call your local emergency services (911 in the [place] Do not drive yourself to the hospital. Summary  Cellulitis is a skin infection. This condition occurs most often in the arms and lower legs.  Treatment for this condition may include medicines, such as antibiotic medicines or antihistamines.  Take over-the-counter and prescription medicines only as told by your health care provider. If you were prescribed an antibiotic medicine, do not stop taking the antibiotic even if you start to feel better.  Contact a health care provider if your symptoms do not begin to improve within 1-2 days of starting treatment or your symptoms get worse.  Keep all follow-up visits as told by your health care provider. This is important. These visits let your health care provider make sure that a more serious infection is not developing. This information is not intended to replace advice given to you by your health care provider. Make sure you discuss any questions you have with your health care provider. Document Revised: 03/16/2018 Document Reviewed: 03/16/2018 Elsevier Patient Education  2020 [employer] Pressure Injury  A pressure injury is damage to the skin and underlying tissue that results from pressure being applied to an area of the body. It often affects people who must spend a long time in a bed or chair because of a medical condition. Pressure injuries usually occur:  Over bony parts of the body, such as the tailbone, shoulders, elbows, hips, heels, spine, ankles, and back of the head.  Under medical devices that make contact with the body, such as  respiratory equipment, stockings, tubes, and splints. Pressure injuries start  as reddened areas on the skin and can lead to pain and an open wound. What are the causes? This condition is caused by frequent or constant pressure to an area of the body. Decreased blood flow to the skin can eventually cause the skin tissue to die and break down, causing a wound. What increases the risk? You are more likely to develop this condition if you:  Are in the hospital or an extended care facility.  Are bedridden or in a wheelchair.  Have an injury or disease that keeps you from: ? Moving normally. ? Feeling pain or pressure.  Have a condition that: ? Makes you sleepy or less alert. ? Causes poor blood flow.  Need to wear a medical device.  Have poor control of your bladder or bowel functions (incontinence).  Have poor nutrition (malnutrition). If you are at risk for pressure injuries, your health care provider may recommend certain types of mattresses, mattress covers, pillows, cushions, or boots to help prevent them. These may include products filled with air, foam, gel, or sand. What are the signs or symptoms? Symptoms of this condition depend on the severity of the injury. Symptoms may include:  Red or dark areas of the skin.  Pain, warmth, or a change of skin texture.  Blisters.  An open wound. How is this diagnosed? This condition is diagnosed with a medical history and physical exam. You may also have tests, such as:  Blood tests.  Imaging tests.  Blood flow tests. Your pressure injury will be staged based on its severity. Staging is based on:  The depth of the tissue injury, including whether there is exposure of muscle, bone, or tendon.  The cause of the pressure injury. How is this treated? This condition may be treated by:  Relieving or redistributing pressure on your skin. This includes: ? Frequently changing your position. ? Avoiding positions that caused the  wound or that can make the wound worse. ? Using specific bed mattresses, chair cushions, or protective boots. ? Moving medical devices from an area of pressure, or placing padding between the skin and the device. ? Using foams, creams, or powders to prevent rubbing (friction) on the skin.  Keeping your skin clean and dry. This may include using a skin cleanser or skin barrier as told by your health care provider.  Cleaning your injury and removing any dead tissue from the wound (debridement).  Placing a bandage (dressing) over your injury.  Using medicines for pain or to prevent or treat infection. Surgery may be needed if other treatments are not working or if your injury is very deep. Follow these instructions at home: Wound care  Follow instructions from your health care provider about how to take care of your wound. Make sure you: ? Wash your hands with soap and water before and after you change your bandage (dressing). If soap and water are not available, use hand sanitizer. ? Change your dressing as told by your health care provider.  Check your wound every day for signs of infection. Have a caregiver do this for you if you are not able. Check for: ? Redness, swelling, or increased pain. ? More fluid or blood. ? Warmth. ? Pus or a bad smell. Skin care  Keep your skin clean and dry. Gently pat your skin dry.  Do not rub or massage your skin.  You or a caregiver should check your skin every day for any changes in color or any new blisters or sores (ulcers).  Medicines  Take over-the-counter and prescription medicines only as told by your health care provider.  If you were prescribed an antibiotic medicine, take or apply it as told by your health care provider. Do not stop using the antibiotic even if your condition improves. Reducing and redistributing pressure  Do not lie or sit in one position for a long time. Move or change position every 1-2 hours, or as told by your  health care provider.  Use pillows or cushions to reduce pressure. Ask your health care provider to recommend cushions or pads for you. General instructions   Eat a healthy diet that includes lots of protein.  Drink enough fluid to keep your urine pale yellow.  Be as active as you can every day. Ask your health care provider to suggest safe exercises or activities.  Do not abuse drugs or alcohol.  Do not use any products that contain nicotine or tobacco, such as cigarettes, e-cigarettes, and chewing tobacco. If you need help quitting, ask your health care provider.  Keep all follow-up visits as told by your health care provider. This is important. Contact a health care provider if:  You have: ? A fever or chills. ? Pain that is not helped by medicine. ? Any changes in skin color. ? New blisters or sores. ? Pus or a bad smell coming from your wound. ? Redness, swelling, or pain around your wound. ? More fluid or blood coming from your wound.  Your wound does not improve after 1-2 weeks of treatment. Summary  A pressure injury is damage to the skin and underlying tissue that results from pressure being applied to an area of the body.  Do not lie or sit in one position for a long time. Your health care provider may advise you to move or change position every 1-2 hours.  Follow instructions from your health care provider about how to take care of your wound.  Keep all follow-up visits as told by your health care provider. This is important. This information is not intended to replace advice given to you by your health care provider. Make sure you discuss any questions you have with your health care provider. Document Revised: 05/24/2018 Document Reviewed: 05/24/2018 Elsevier Patient Education  2020 [employer] Diabetes Mellitus and Foot Care Foot care is an important part of your health, especially when you have diabetes. Diabetes may cause you to have problems because of  poor blood flow (circulation) to your feet and legs, which can cause your skin to:  Become thinner and drier.  Break more easily.  Heal more slowly.  Peel and crack. You may also have nerve damage (neuropathy) in your legs and feet, causing decreased feeling in them. This means that you may not notice minor injuries to your feet that could lead to more serious problems. Noticing and addressing any potential problems early is the best way to prevent future foot problems. How to care for your feet Foot hygiene  Wash your feet daily with warm water and mild soap. Do not use hot water. Then, pat your feet and the areas between your toes until they are completely dry. Do not soak your feet as this can dry your skin.  Trim your toenails straight across. Do not dig under them or around the cuticle. File the edges of your nails with an emery board or nail file.  Apply a moisturizing lotion or petroleum jelly to the skin on your feet and to dry, brittle toenails. Use lotion  that does not contain alcohol and is unscented. Do not apply lotion between your toes. Shoes and socks  Wear clean socks or stockings every day. Make sure they are not too tight. Do not wear knee-high stockings since they may decrease blood flow to your legs.  Wear shoes that fit properly and have enough cushioning. Always look in your shoes before you put them on to be sure there are no objects inside.  To break in new shoes, wear them for just a few hours a day. This prevents injuries on your feet. Wounds, scrapes, corns, and calluses  Check your feet daily for blisters, cuts, bruises, sores, and redness. If you cannot see the bottom of your feet, use a mirror or ask someone for help.  Do not cut corns or calluses or try to remove them with medicine.  If you find a minor scrape, cut, or break in the skin on your feet, keep it and the skin around it clean and dry. You may clean these areas with mild soap and water. Do not  clean the area with peroxide, alcohol, or iodine.  If you have a wound, scrape, corn, or callus on your foot, look at it several times a day to make sure it is healing and not infected. Check for: ? Redness, swelling, or pain. ? Fluid or blood. ? Warmth. ? Pus or a bad smell. General instructions  Do not cross your legs. This may decrease blood flow to your feet.  Do not use heating pads or hot water bottles on your feet. They may burn your skin. If you have lost feeling in your feet or legs, you may not know this is happening until it is too late.  Protect your feet from hot and cold by wearing shoes, such as at the beach or on hot pavement.  Schedule a complete foot exam at least once a year (annually) or more often if you have foot problems. If you have foot problems, report any cuts, sores, or bruises to your health care provider immediately. Contact a health care provider if:  You have a medical condition that increases your risk of infection and you have any cuts, sores, or bruises on your feet.  You have an injury that is not healing.  You have redness on your legs or feet.  You feel burning or tingling in your legs or feet.  You have pain or cramps in your legs and feet.  Your legs or feet are numb.  Your feet always feel cold.  You have pain around a toenail. Get help right away if:  You have a wound, scrape, corn, or callus on your foot and: ? You have pain, swelling, or redness that gets worse. ? You have fluid or blood coming from the wound, scrape, corn, or callus. ? Your wound, scrape, corn, or callus feels warm to the touch. ? You have pus or a bad smell coming from the wound, scrape, corn, or callus. ? You have a fever. ? You have a red line going up your leg. Summary  Check your feet every day for cuts, sores, red spots, swelling, and blisters.  Moisturize feet and legs daily.  Wear shoes that fit properly and have enough cushioning.  If you have  foot problems, report any cuts, sores, or bruises to your health care provider immediately.  Schedule a complete foot exam at least once a year (annually) or more often if you have foot problems. This information is not  intended to replace advice given to you by your health care provider. Make sure you discuss any questions you have with your health care provider. Document Revised: 07/18/2019 Document Reviewed: 11/26/2016 Elsevier Patient Education  2020 [employer]  Cellulitis, Adult  Cellulitis is a skin infection. The infected area is usually warm, red, swollen, and tender. This condition occurs most often in the arms and lower legs. The infection can travel to the muscles, blood, and underlying tissue and become serious. It is very important to get treated for this condition. What are the causes? Cellulitis is caused by bacteria. The bacteria enter through a break in the skin, such as a cut, burn, insect bite, open sore, or crack. What increases the risk? This condition is more likely to occur in people who:  Have a weak body defense system (immune system).  Have open wounds on the skin, such as cuts, burns, bites, and scrapes. Bacteria can enter the body through these open wounds.  Are older than 50 years of age.  Have diabetes.  Have a type of long-lasting (chronic) liver disease (cirrhosis) or kidney disease.  Are obese.  Have a skin condition such as: ? Itchy rash (eczema). ? Slow movement of blood in the veins (venous stasis). ? Fluid buildup below the skin (edema).  Have had radiation therapy.  Use IV drugs. What are the signs or symptoms? Symptoms of this condition include:  Redness, streaking, or spotting on the skin.  Swollen area of the skin.  Tenderness or pain when an area of the skin is touched.  Warm skin.  A fever.  Chills.  Blisters. How is this diagnosed? This condition is diagnosed based on a medical history and physical exam. You may also  have tests, including:  Blood tests.  Imaging tests. How is this treated? Treatment for this condition may include:  Medicines, such as antibiotic medicines or medicines to treat allergies (antihistamines).  Supportive care, such as rest and application of cold or warm cloths (compresses) to the skin.  Hospital care, if the condition is severe. The infection usually starts to get better within 1-2 days of treatment. Follow these instructions at home:  Medicines  Take over-the-counter and prescription medicines only as told by your health care provider.  If you were prescribed an antibiotic medicine, take it as told by your health care provider. Do not stop taking the antibiotic even if you start to feel better. General instructions  Drink enough fluid to keep your urine pale yellow.  Do not touch or rub the infected area.  Raise (elevate) the infected area above the level of your heart while you are sitting or lying down.  Apply warm or cold compresses to the affected area as told by your health care provider.  Keep all follow-up visits as told by your health care provider. This is important. These visits let your health care provider make sure a more serious infection is not developing. Contact a health care provider if:  You have a fever.  Your symptoms do not begin to improve within 1-2 days of starting treatment.  Your bone or joint underneath the infected area becomes painful after the skin has healed.  Your infection returns in the same area or another area.  You notice a swollen bump in the infected area.  You develop new symptoms.  You have a general ill feeling (malaise) with muscle aches and pains. Get help right away if:  Your symptoms get worse.  You feel very sleepy.  You develop vomiting or diarrhea that persists.  You notice red streaks coming from the infected area.  Your red area gets larger or turns dark in color. These symptoms may  represent a serious problem that is an emergency. Do not wait to see if the symptoms will go away. Get medical help right away. Call your local emergency services (911 in the [place] Do not drive yourself to the hospital. Summary  Cellulitis is a skin infection. This condition occurs most often in the arms and lower legs.  Treatment for this condition may include medicines, such as antibiotic medicines or antihistamines.  Take over-the-counter and prescription medicines only as told by your health care provider. If you were prescribed an antibiotic medicine, do not stop taking the antibiotic even if you start to feel better.  Contact a health care provider if your symptoms do not begin to improve within 1-2 days of starting treatment or your symptoms get worse.  Keep all follow-up visits as told by your health care provider. This is important. These visits let your health care provider make sure that a more serious infection is not developing. This information is not intended to replace advice given to you by your health care provider. Make sure you discuss any questions you have with your health care provider. Document Revised: 03/16/2018 Document Reviewed: 03/16/2018 Elsevier Patient Education  2020 [employer].

## 2020-06-02 ENCOUNTER — Other Ambulatory Visit: Payer: Self-pay | Admitting: Family Medicine

## 2020-06-10 ENCOUNTER — Ambulatory Visit: Payer: PRIVATE HEALTH INSURANCE | Admitting: Family Medicine

## 2020-06-10 ENCOUNTER — Other Ambulatory Visit: Payer: Self-pay

## 2020-06-10 ENCOUNTER — Encounter: Payer: Self-pay | Admitting: Family Medicine

## 2020-06-10 VITALS — BP 118/70 | HR 69 | Temp 97.9°F | Resp 16 | Ht 71.0 in | Wt 160.4 lb

## 2020-06-10 DIAGNOSIS — E785 Hyperlipidemia, unspecified: Secondary | ICD-10-CM | POA: Diagnosis not present

## 2020-06-10 DIAGNOSIS — F419 Anxiety disorder, unspecified: Secondary | ICD-10-CM | POA: Diagnosis not present

## 2020-06-10 DIAGNOSIS — M25512 Pain in left shoulder: Secondary | ICD-10-CM

## 2020-06-10 DIAGNOSIS — E119 Type 2 diabetes mellitus without complications: Secondary | ICD-10-CM | POA: Diagnosis not present

## 2020-06-10 DIAGNOSIS — R234 Changes in skin texture: Secondary | ICD-10-CM

## 2020-06-10 DIAGNOSIS — M25511 Pain in right shoulder: Secondary | ICD-10-CM | POA: Diagnosis not present

## 2020-06-10 DIAGNOSIS — R239 Unspecified skin changes: Secondary | ICD-10-CM

## 2020-06-10 LAB — CBC WITH DIFFERENTIAL/PLATELET
Basophils Absolute: 0 10*3/uL (ref 0.0–0.1)
Basophils Relative: 0.5 % (ref 0.0–3.0)
Eosinophils Absolute: 0.2 10*3/uL (ref 0.0–0.7)
Eosinophils Relative: 3.6 % (ref 0.0–5.0)
HCT: 43.7 % (ref 39.0–52.0)
Hemoglobin: 14.5 g/dL (ref 13.0–17.0)
Lymphocytes Relative: 33.3 % (ref 12.0–46.0)
Lymphs Abs: 2 10*3/uL (ref 0.7–4.0)
MCHC: 33.2 g/dL (ref 30.0–36.0)
MCV: 93.4 fl (ref 78.0–100.0)
Monocytes Absolute: 0.6 10*3/uL (ref 0.1–1.0)
Monocytes Relative: 10.1 % (ref 3.0–12.0)
Neutro Abs: 3.1 10*3/uL (ref 1.4–7.7)
Neutrophils Relative %: 52.5 % (ref 43.0–77.0)
Platelets: 207 10*3/uL (ref 150.0–400.0)
RBC: 4.68 Mil/uL (ref 4.22–5.81)
RDW: 13.1 % (ref 11.5–15.5)
WBC: 6 10*3/uL (ref 4.0–10.5)

## 2020-06-10 LAB — HEPATIC FUNCTION PANEL
ALT: 25 U/L (ref 0–53)
AST: 22 U/L (ref 0–37)
Albumin: 4.1 g/dL (ref 3.5–5.2)
Alkaline Phosphatase: 82 U/L (ref 39–117)
Bilirubin, Direct: 0.1 mg/dL (ref 0.0–0.3)
Total Bilirubin: 0.4 mg/dL (ref 0.2–1.2)
Total Protein: 6 g/dL (ref 6.0–8.3)

## 2020-06-10 LAB — LIPID PANEL
Cholesterol: 162 mg/dL (ref 0–200)
HDL: 32.1 mg/dL — ABNORMAL LOW (ref >39.00)
NonHDL: 130.15
Total CHOL/HDL Ratio: 5
Triglycerides: 364 mg/dL — ABNORMAL HIGH (ref 0.0–149.0)
VLDL: 72.8 mg/dL — ABNORMAL HIGH (ref 0.0–40.0)

## 2020-06-10 LAB — LDL CHOLESTEROL, DIRECT: Direct LDL: 83 mg/dL

## 2020-06-10 LAB — BASIC METABOLIC PANEL
BUN: 18 mg/dL (ref 6–23)
CO2: 26 mEq/L (ref 19–32)
Calcium: 8.8 mg/dL (ref 8.4–10.5)
Chloride: 104 mEq/L (ref 96–112)
Creatinine, Ser: 1.01 mg/dL (ref 0.40–1.50)
GFR: 78.09 mL/min (ref >60.00)
Glucose, Bld: 213 mg/dL — ABNORMAL HIGH (ref 70–99)
Potassium: 4 mEq/L (ref 3.5–5.1)
Sodium: 136 mEq/L (ref 135–145)

## 2020-06-10 LAB — TSH: TSH: 1.89 u[IU]/mL (ref 0.35–4.50)

## 2020-06-10 MED ORDER — CLOTRIMAZOLE-BETAMETHASONE 1-0.05 % EX CREA: 1.0000 "application " | TOPICAL_CREAM | Freq: Two times a day (BID) | CUTANEOUS | 0 refills | Status: DC

## 2020-06-10 NOTE — Progress Notes (Signed)
° °  Subjective:    Patient ID: Frederick Abbott, male    DOB: 1971-05-01, 49 y.o.   MRN: [id number]  HPI DM- chronic problem, following w/ Endo ([facility]).  On ACE for renal protection.  Due for eye exam.  UTD on foot exam but pt reports he has 'a spot between my toes'.  L foot.  1st noticed 3-4 weeks ago.  Has been applying antibiotic cream.  Occasionally painful.    Hyperlipidemia- chronic problem, on Fenofibrate 160mg  daily and Lipitor 10mg  daily.  No CP, SOB, abd pain, N/V.  Anxiety- chronic problem, on Wellbutrin 300mg  daily, Alprazolam prn.  Currently in therapy after losing sister last fall  Bilateral shoulder pain- not noticeable during the day but painful to roll over at night (on either side).  No difficulty w/ overhead motion.  Had L shoulder issue a few months ago- saw sports med.  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  Pt is not sure whether sxs occur on the weekends.  Skin changes- pt has area on L lateral nasal bridge that he feels is new.  Also multiple moles.  Review of Systems For ROS see HPI   This visit occurred during the SARS-CoV-2 public health emergency.  Safety protocols were in place, including screening questions prior to the visit, additional usage of staff PPE, and extensive cleaning of exam room while observing appropriate contact time as indicated for disinfecting solutions.       Objective:   Physical Exam Vitals reviewed.  Constitutional:      General: He is not in acute distress.    Appearance: Normal appearance. He is well-developed.  HENT:     Head: Normocephalic and atraumatic.  Eyes:     Conjunctiva/sclera: Conjunctivae normal.     Pupils: Pupils are equal, round, and reactive to light.  Neck:     Thyroid: No thyromegaly.  Cardiovascular:     Rate and Rhythm: Normal rate and regular rhythm.     Heart sounds: Normal heart sounds. No murmur heard.   Pulmonary:     Effort: Pulmonary effort is normal. No respiratory distress.      Breath sounds: Normal breath sounds.  Abdominal:     General: Bowel sounds are normal. There is no distension.     Palpations: Abdomen is soft.  Musculoskeletal:        General: Normal range of motion.     Cervical back: Normal range of motion and neck supple.     Comments: Full ROM of shoulders bilaterally  Lymphadenopathy:     Cervical: No cervical adenopathy.  Skin:    General: Skin is warm and dry.     Findings: No erythema.     Comments: Well healing fissure between L 4th and 5th toes- no erythema, warmth, drainage  Neurological:     Mental Status: He is alert and oriented to person, place, and time.     Cranial Nerves: No cranial nerve deficit.  Psychiatric:        Behavior: Behavior normal.           Assessment & Plan:  Bilateral shoulder pain- new.  Suspect this is overuse due to the amount of physical activity at work.  Encouraged him to pay attention to whether he has sxs over the weekend.  Tylenol/ibuprofen as needed for discomfort.  If no improvement will need referral.  Skin lesion- new.  Pt has never seen Dermatology.  Will refer for complete evaluation.

## 2020-06-10 NOTE — Assessment & Plan Note (Signed)
Chronic problem.  On Fenofibrate and Lipitor w/o difficulty.  Check labs.  Adjust meds prn

## 2020-06-10 NOTE — Assessment & Plan Note (Signed)
Chronic problem, adequate control at this time.  No med changes.  Will follow.

## 2020-06-10 NOTE — Patient Instructions (Addendum)
Schedule your complete physical in 6 months We'll notify you of your lab results and make any changes if needed Schedule your eye exam at your convenience Please have Endocrinology send me copies of their notes Apply the cream to the foot twice daily We'll call you with your dermatology appt I think the shoulder pain is due to overuse at work.  Try tylenol/ibuprofen/aleve before bed Call with any questions or concerns Hang in there!!!

## 2020-06-10 NOTE — Assessment & Plan Note (Signed)
Chronic problem.  Following w/ Endo.  Due for eye exam- encouraged pt to schedule.  On ACE for renal protection, UTD on foot exam.

## 2020-07-18 ENCOUNTER — Other Ambulatory Visit: Payer: Self-pay | Admitting: Family Medicine

## 2020-08-16 ENCOUNTER — Other Ambulatory Visit: Payer: Self-pay | Admitting: Family Medicine

## 2020-08-21 ENCOUNTER — Other Ambulatory Visit: Payer: Self-pay | Admitting: Internal Medicine

## 2020-08-21 DIAGNOSIS — B20 Human immunodeficiency virus [HIV] disease: Secondary | ICD-10-CM

## 2020-09-02 ENCOUNTER — Other Ambulatory Visit: Payer: Self-pay | Admitting: Family Medicine

## 2020-09-09 LAB — HEMOGLOBIN A1C: Hemoglobin A1C: 6.2

## 2020-11-10 ENCOUNTER — Other Ambulatory Visit: Payer: Self-pay | Admitting: Family Medicine

## 2020-12-18 ENCOUNTER — Encounter: Payer: Self-pay | Admitting: Family Medicine

## 2020-12-19 ENCOUNTER — Other Ambulatory Visit: Payer: Self-pay | Admitting: Internal Medicine

## 2020-12-19 DIAGNOSIS — B20 Human immunodeficiency virus [HIV] disease: Secondary | ICD-10-CM

## 2020-12-22 ENCOUNTER — Encounter: Payer: Self-pay | Admitting: Family Medicine

## 2020-12-22 ENCOUNTER — Ambulatory Visit (INDEPENDENT_AMBULATORY_CARE_PROVIDER_SITE_OTHER): Payer: PRIVATE HEALTH INSURANCE | Admitting: Family Medicine

## 2020-12-22 ENCOUNTER — Other Ambulatory Visit: Payer: Self-pay

## 2020-12-22 VITALS — BP 110/78 | HR 74 | Temp 98.2°F | Resp 18 | Ht 71.0 in | Wt 168.6 lb

## 2020-12-22 DIAGNOSIS — Z0000 Encounter for general adult medical examination without abnormal findings: Secondary | ICD-10-CM | POA: Diagnosis not present

## 2020-12-22 DIAGNOSIS — B20 Human immunodeficiency virus [HIV] disease: Secondary | ICD-10-CM | POA: Diagnosis not present

## 2020-12-22 DIAGNOSIS — Z125 Encounter for screening for malignant neoplasm of prostate: Secondary | ICD-10-CM | POA: Diagnosis not present

## 2020-12-22 DIAGNOSIS — E119 Type 2 diabetes mellitus without complications: Secondary | ICD-10-CM

## 2020-12-22 LAB — BASIC METABOLIC PANEL
BUN: 16 mg/dL (ref 6–23)
CO2: 24 mEq/L (ref 19–32)
Calcium: 9 mg/dL (ref 8.4–10.5)
Chloride: 103 mEq/L (ref 96–112)
Creatinine, Ser: 1.26 mg/dL (ref 0.40–1.50)
GFR: 66.34 mL/min (ref >60.00)
Glucose, Bld: 219 mg/dL — ABNORMAL HIGH (ref 70–99)
Potassium: 3.9 mEq/L (ref 3.5–5.1)
Sodium: 137 mEq/L (ref 135–145)

## 2020-12-22 LAB — CBC WITH DIFFERENTIAL/PLATELET
Basophils Absolute: 0 10*3/uL (ref 0.0–0.1)
Basophils Relative: 0.5 % (ref 0.0–3.0)
Eosinophils Absolute: 0.2 10*3/uL (ref 0.0–0.7)
Eosinophils Relative: 3.1 % (ref 0.0–5.0)
HCT: 47.7 % (ref 39.0–52.0)
Hemoglobin: 16.1 g/dL (ref 13.0–17.0)
Lymphocytes Relative: 32.6 % (ref 12.0–46.0)
Lymphs Abs: 2.6 10*3/uL (ref 0.7–4.0)
MCHC: 33.8 g/dL (ref 30.0–36.0)
MCV: 88.4 fl (ref 78.0–100.0)
Monocytes Absolute: 0.8 10*3/uL (ref 0.1–1.0)
Monocytes Relative: 9.7 % (ref 3.0–12.0)
Neutro Abs: 4.4 10*3/uL (ref 1.4–7.7)
Neutrophils Relative %: 54.1 % (ref 43.0–77.0)
Platelets: 245 10*3/uL (ref 150.0–400.0)
RBC: 5.4 Mil/uL (ref 4.22–5.81)
RDW: 13 % (ref 11.5–15.5)
WBC: 8.1 10*3/uL (ref 4.0–10.5)

## 2020-12-22 LAB — HEPATIC FUNCTION PANEL
ALT: 28 U/L (ref 0–53)
AST: 21 U/L (ref 0–37)
Albumin: 4.3 g/dL (ref 3.5–5.2)
Alkaline Phosphatase: 102 U/L (ref 39–117)
Bilirubin, Direct: 0.1 mg/dL (ref 0.0–0.3)
Total Bilirubin: 0.4 mg/dL (ref 0.2–1.2)
Total Protein: 6.6 g/dL (ref 6.0–8.3)

## 2020-12-22 LAB — HEMOGLOBIN A1C: Hgb A1c MFr Bld: 8.1 % — ABNORMAL HIGH (ref 4.6–6.5)

## 2020-12-22 LAB — LIPID PANEL
Cholesterol: 198 mg/dL (ref 0–200)
HDL: 34.6 mg/dL — ABNORMAL LOW (ref >39.00)
Total CHOL/HDL Ratio: 6
Triglycerides: 823 mg/dL — ABNORMAL HIGH (ref 0.0–149.0)

## 2020-12-22 LAB — TSH: TSH: 2.81 u[IU]/mL (ref 0.35–4.50)

## 2020-12-22 LAB — PSA: PSA: 0.54 ng/mL (ref 0.10–4.00)

## 2020-12-22 LAB — LDL CHOLESTEROL, DIRECT: Direct LDL: 71 mg/dL

## 2020-12-22 NOTE — Assessment & Plan Note (Signed)
Chronic problem.  Following w/ Dr [name].  Doing well overall.  Will follow.

## 2020-12-22 NOTE — Assessment & Plan Note (Signed)
Chronic problem.  Following w/ Dr Gayla Medicus.  Due for eye exam- pt to schedule.  Foot exam done today.

## 2020-12-22 NOTE — Assessment & Plan Note (Signed)
Pt's PE WNL.  UTD on immunizations, colonoscopy.  Check labs.  Anticipatory guidance provided.  

## 2020-12-22 NOTE — Patient Instructions (Addendum)
Follow up in 6 months to recheck cholesterol We'll notify you of your lab results and make any changes if needed Continue to work on healthy diet and regular exercise- you can do it! Schedule your eye exam! Call with any questions or concerns Stay Safe!  Stay Healthy!

## 2020-12-22 NOTE — Progress Notes (Signed)
   Subjective:    Patient ID: Frederick Abbott, male    DOB: 11-14-1970, 50 y.o.   MRN: [id number]  HPI CPE- UTD on Tdap, flu, COVID, PNA vaccines.  UTD on colonoscopy.  Due for eye exam, foot exam.  Reviewed past medical, surgical, family and social histories.   Patient Care Team    Relationship Specialty Notifications Start End  Sheliah Hatch, MD PCP - General Family Medicine  08/31/12   Cliffton Asters, MD PCP - Infectious Diseases Infectious Diseases  10/21/10   Meryl Dare, MD Consulting Physician Gastroenterology  09/11/15   Marlene Lard, MD Consulting Physician Endocrinology  12/20/19     Health Maintenance  Topic Date Due  . Ma Hillock  01/03/2019  . HEMOGLOBIN A1C  03/09/2021  . FOOT EXAM  12/22/2021  . TETANUS/TDAP  08/10/2023  . COLONOSCOPY (Pts 45-71yrs Insurance coverage will need to be confirmed)  10/26/2025  . INFLUENZA VACCINE  Completed  . PNEUMOCOCCAL POLYSACCHARIDE VACCINE AGE 56-64 HIGH RISK  Completed  . COVID-19 Vaccine  Completed  . Hepatitis C Screening  Completed  . HIV Screening  Completed     Review of Systems Patient reports no vision/hearing changes, anorexia, fever ,adenopathy, persistant/recurrent hoarseness, swallowing issues, chest pain, palpitations, edema, persistant/recurrent cough, hemoptysis, dyspnea (rest,exertional, paroxysmal nocturnal), gastrointestinal  bleeding (melena, rectal bleeding), abdominal pain, excessive heart burn, GU symptoms (dysuria, hematuria, voiding/incontinence issues) syncope, focal weakness, memory loss, numbness & tingling, skin/hair/nail changes, depression, anxiety, abnormal bruising/bleeding, musculoskeletal symptoms/signs.   This visit occurred during the SARS-CoV-2 public health emergency.  Safety protocols were in place, including screening questions prior to the visit, additional usage of staff PPE, and extensive cleaning of exam room while observing appropriate contact time as indicated for  disinfecting solutions.       Objective:   Physical Exam General Appearance:    Alert, cooperative, no distress, appears stated age  Head:    Normocephalic, without obvious abnormality, atraumatic  Eyes:    PERRL, conjunctiva/corneas clear, EOM's intact, fundi    benign, both eyes       Ears:    Normal TM's and external ear canals, both ears  Nose:   Deferred due to COVID  Throat:   Neck:   Supple, symmetrical, trachea midline, no adenopathy;       thyroid:  No enlargement/tenderness/nodules  Back:     Symmetric, no curvature, ROM normal, no CVA tenderness  Lungs:     Clear to auscultation bilaterally, respirations unlabored  Chest wall:    No tenderness or deformity  Heart:    Regular rate and rhythm, S1 and S2 normal, no murmur, rub   or gallop  Abdomen:     Soft, non-tender, bowel sounds active all four quadrants,    no masses, no organomegaly  Genitalia:    deferred  Rectal:    Extremities:   Extremities normal, atraumatic, no cyanosis or edema  Pulses:   2+ and symmetric all extremities  Skin:   Skin color, texture, turgor normal, no rashes or lesions  Lymph nodes:   Cervical, supraclavicular, and axillary nodes normal  Neurologic:   CNII-XII intact. Normal strength, sensation and reflexes      throughout          Assessment & Plan:

## 2021-01-30 ENCOUNTER — Telehealth: Payer: Self-pay

## 2021-01-30 NOTE — Telephone Encounter (Signed)
Error   Clearance Coots, CPhT Specialty Pharmacy Patient [facility] for Infectious Disease Phone: [phone] Fax:  [phone]

## 2021-02-03 ENCOUNTER — Other Ambulatory Visit: Payer: Self-pay | Admitting: Family Medicine

## 2021-02-08 ENCOUNTER — Encounter: Payer: Self-pay | Admitting: Registered Nurse

## 2021-02-08 ENCOUNTER — Telehealth: Payer: Self-pay | Admitting: Registered Nurse

## 2021-02-08 DIAGNOSIS — Z20822 Contact with and (suspected) exposure to covid-19: Secondary | ICD-10-CM

## 2021-02-08 NOTE — Telephone Encounter (Signed)
Patient returned call stated did home covid tests 31 Mar, 1-3 Ap as was planning to have party in his home for birthday but decided to cancel after having covid exposure at work on 02/03/2021. Notified by HR staff of covid exposure at work after contact tracing was completed.   Patient fully covid vaccinated with booster and may be onsite at work monitoring for symptoms and wearing mask for 10 days.  Day 0 02/03/2021  Day 5 02/08/2021  Day 10 02/13/2021.  Patient denied any covid symptoms and reported he will be on vacation this week and not at work.  Recommend patient wear mask when around others and sanitize high touch surfaces daily with lysol/bleach or chlorox spray/wipes.  Patient to notify clinic staff if symptoms develop and recommend day 3 of symptom testing if symptoms do develop.  Patient verbalized understanding information/instructions, agreed with plan of care and had no further questions at this time. Patient A&Ox3 respirations even and unlabored spoke full sentences without difficulty no nasal congestion/cough or throat clearing noted during 3 minute telephone call.   HR notified patient negative home covid test and cleared to return onsite wearing mask through day 10 02/13/2021 but patient has vacation requested this week.

## 2021-02-10 ENCOUNTER — Other Ambulatory Visit: Payer: Self-pay | Admitting: Internal Medicine

## 2021-02-10 DIAGNOSIS — B20 Human immunodeficiency virus [HIV] disease: Secondary | ICD-10-CM

## 2021-02-12 ENCOUNTER — Other Ambulatory Visit: Payer: PRIVATE HEALTH INSURANCE

## 2021-02-12 ENCOUNTER — Other Ambulatory Visit: Payer: Self-pay | Admitting: *Deleted

## 2021-02-12 ENCOUNTER — Other Ambulatory Visit: Payer: Self-pay

## 2021-02-12 DIAGNOSIS — Z113 Encounter for screening for infections with a predominantly sexual mode of transmission: Secondary | ICD-10-CM

## 2021-02-12 DIAGNOSIS — B20 Human immunodeficiency virus [HIV] disease: Secondary | ICD-10-CM

## 2021-02-13 LAB — URINE CYTOLOGY ANCILLARY ONLY
Chlamydia: NEGATIVE
Comment: NEGATIVE
Comment: NORMAL
Neisseria Gonorrhea: NEGATIVE

## 2021-02-13 LAB — T-HELPER CELL (CD4) - (RCID CLINIC ONLY)
CD4 % Helper T Cell: 37 % (ref 33–65)
CD4 T Cell Abs: 866 /uL (ref 400–1790)

## 2021-02-15 LAB — COMPLETE METABOLIC PANEL WITH GFR
AG Ratio: 2 (calc) (ref 1.0–2.5)
ALT: 32 U/L (ref 9–46)
AST: 24 U/L (ref 10–35)
Albumin: 4.5 g/dL (ref 3.6–5.1)
Alkaline phosphatase (APISO): 87 U/L (ref 35–144)
BUN: 17 mg/dL (ref 7–25)
CO2: 24 mmol/L (ref 20–32)
Calcium: 9.4 mg/dL (ref 8.6–10.3)
Chloride: 105 mmol/L (ref 98–110)
Creat: 1.26 mg/dL (ref 0.70–1.33)
GFR, Est African American: 76 mL/min/{1.73_m2} (ref >=60)
GFR, Est Non African American: 66 mL/min/{1.73_m2} (ref >=60)
Globulin: 2.2 g/dL (calc) (ref 1.9–3.7)
Glucose, Bld: 237 mg/dL — ABNORMAL HIGH (ref 65–99)
Potassium: 4.4 mmol/L (ref 3.5–5.3)
Sodium: 139 mmol/L (ref 135–146)
Total Bilirubin: 0.4 mg/dL (ref 0.2–1.2)
Total Protein: 6.7 g/dL (ref 6.1–8.1)

## 2021-02-15 LAB — CBC WITH DIFFERENTIAL/PLATELET
Absolute Monocytes: 562 cells/uL (ref 200–950)
Basophils Absolute: 50 cells/uL (ref 0–200)
Basophils Relative: 0.7 %
Eosinophils Absolute: 259 cells/uL (ref 15–500)
Eosinophils Relative: 3.6 %
HCT: 47.7 % (ref 38.5–50.0)
Hemoglobin: 16 g/dL (ref 13.2–17.1)
Lymphs Abs: 2448 cells/uL (ref 850–3900)
MCH: 29.9 pg (ref 27.0–33.0)
MCHC: 33.5 g/dL (ref 32.0–36.0)
MCV: 89.2 fL (ref 80.0–100.0)
MPV: 10.4 fL (ref 7.5–12.5)
Monocytes Relative: 7.8 %
Neutro Abs: 3881 cells/uL (ref 1500–7800)
Neutrophils Relative %: 53.9 %
Platelets: 256 10*3/uL (ref 140–400)
RBC: 5.35 10*6/uL (ref 4.20–5.80)
RDW: 12.9 % (ref 11.0–15.0)
Total Lymphocyte: 34 %
WBC: 7.2 10*3/uL (ref 3.8–10.8)

## 2021-02-15 LAB — HIV-1 RNA QUANT-NO REFLEX-BLD
HIV 1 RNA Quant: NOT DETECTED Copies/mL
HIV-1 RNA Quant, Log: NOT DETECTED Log cps/mL

## 2021-02-15 LAB — RPR: RPR Ser Ql: NONREACTIVE

## 2021-02-18 ENCOUNTER — Telehealth: Payer: Self-pay

## 2021-02-18 NOTE — Telephone Encounter (Signed)
[facility] Patient [facility] Encounter    ERROR  Fujie Dickison, CPhT Specialty Pharmacy Patient [facility] Phone: [phone] Fax:  [phone]  

## 2021-02-22 ENCOUNTER — Other Ambulatory Visit: Payer: Self-pay | Admitting: Family Medicine

## 2021-02-23 ENCOUNTER — Other Ambulatory Visit: Payer: Self-pay | Admitting: Internal Medicine

## 2021-02-23 ENCOUNTER — Other Ambulatory Visit: Payer: Self-pay

## 2021-02-23 ENCOUNTER — Telehealth: Payer: Self-pay

## 2021-02-23 DIAGNOSIS — B20 Human immunodeficiency virus [HIV] disease: Secondary | ICD-10-CM

## 2021-02-23 MED ORDER — BIKTARVY 50-200-25 MG PO TABS: 1.0000 | ORAL_TABLET | Freq: Every day | ORAL | 0 refills | Status: DC

## 2021-02-23 NOTE — Telephone Encounter (Signed)
PA initiated for [employer] through [employer]. Awaiting response.   Kerrilynn Derenzo Loyola Mast, RN

## 2021-02-23 NOTE — Telephone Encounter (Signed)
PA approved for patient's Biktarvy 02/23/2021 -[id number]/31-2039. Patient and pharmacy notified.   Oaklie Durrett Loyola Mast, RN

## 2021-03-03 ENCOUNTER — Ambulatory Visit (INDEPENDENT_AMBULATORY_CARE_PROVIDER_SITE_OTHER): Payer: PRIVATE HEALTH INSURANCE | Admitting: Internal Medicine

## 2021-03-03 ENCOUNTER — Other Ambulatory Visit: Payer: Self-pay

## 2021-03-03 ENCOUNTER — Encounter: Payer: Self-pay | Admitting: Internal Medicine

## 2021-03-03 DIAGNOSIS — B20 Human immunodeficiency virus [HIV] disease: Secondary | ICD-10-CM | POA: Diagnosis not present

## 2021-03-03 MED ORDER — BIKTARVY 50-200-25 MG PO TABS: 1.0000 | ORAL_TABLET | Freq: Every day | ORAL | 11 refills | Status: DC

## 2021-03-03 NOTE — Assessment & Plan Note (Signed)
His infection has been under excellent, long-term control.  He will continue Biktarvy and follow-up after lab work in 1 year. 

## 2021-03-03 NOTE — Progress Notes (Signed)
Patient Active Problem List   Diagnosis Date Noted  . DM II (diabetes mellitus, type II), controlled (HCC) 12/11/2009    Priority: High  . Hyperlipidemia 08/20/2008    Priority: High  . Human immunodeficiency virus (HIV) disease (HCC) 06/01/2007    Priority: High  . Chronic nausea 12/06/2018  . Rectal bleeding 12/27/2014  . Elevated LFTs 12/16/2014  . Eustachian tube dysfunction 08/09/2013  . Routine general medical examination at a health care facility 02/07/2013  . Dyshidrotic eczema 02/07/2013  . DECREASED HEARING, BILATERAL 09/18/2009  . LOW BACK PAIN, CHRONIC 10/17/2008  . VENEREAL WART 08/20/2008  . GERD 06/05/2007  . Anxiety 06/01/2007  . ALLERGIC RHINITIS 06/01/2007  . PSORIASIS NEC 06/01/2007  . HEPATITIS B, HX OF 06/01/2007    Patient's Medications  New Prescriptions   No medications on file  Previous Medications   ALBUTEROL (PROVENTIL HFA;VENTOLIN HFA) 108 (90 BASE) MCG/ACT INHALER    Inhale 1-2 puffs into the lungs every 4 (four) hours as needed for wheezing or shortness of breath.   ALPRAZOLAM (XANAX) 0.5 MG TABLET    Take 1 tablet (0.5 mg total) by mouth 2 (two) times daily as needed for anxiety.   ASPIRIN 81 MG TABLET    Take 81 mg by mouth daily.   ATORVASTATIN (LIPITOR) 10 MG TABLET    TAKE 1 TABLET BY MOUTH EVERY DAY AT NIGHT   BD PEN NEEDLE NANO U/F 32G X 4 MM MISC    USE 1 NEEDLE TO INJECT 24 UNITS INTO THE SKIN DAILY.   BUPROPION (WELLBUTRIN XL) 300 MG 24 HR TABLET    TAKE 1 TABLET BY MOUTH EVERY DAY   CLOTRIMAZOLE-BETAMETHASONE (LOTRISONE) CREAM    Apply 1 application topically 2 (two) times daily.   CONTINUOUS BLOOD GLUC SENSOR (FREESTYLE LIBRE 14 DAY SENSOR) MISC    CHANGE EVERY 14 DAYS   FENOFIBRATE 160 MG TABLET    TAKE 1 TABLET BY MOUTH EVERY DAY   GLUCOSE BLOOD TEST STRIP    1 each by Other route 3 (three) times daily as needed. Use as directed.   HYDROCORTISONE ACETATE 2.5 % CREA    Apply 1 application topically 2 (two) times daily.  For 7-10 days   IBUPROFEN (ADVIL,MOTRIN) 200 MG TABLET    Take 600 mg by mouth every 6 (six) hours as needed for moderate pain.   INSULIN GLARGINE, 1 UNIT DIAL, (TOUJEO SOLOSTAR) 300 UNIT/ML SOLOSTAR PEN    12 units   INSULIN LISPRO (HUMALOG KWIKPEN) 200 UNIT/ML KWIKPEN    4 units+correctional   JARDIANCE 10 MG TABS TABLET    TAKE 1 TABLET BY MOUTH EVERY DAY   LISINOPRIL (ZESTRIL) 5 MG TABLET    TAKE 1 TABLET BY MOUTH EVERY DAY   LORATADINE (CLARITIN) 10 MG TABLET    Take 10 mg by mouth daily.   MAGNESIUM OXIDE (MAG-OX) 400 MG TABLET    Take 400 mg by mouth daily.   MENTHOL 5 % PTCH    Apply 1 patch topically as needed (knee pain).   MULTIPLE VITAMIN (MULTIVITAMIN WITH MINERALS) TABS TABLET    Take 1 tablet by mouth daily.   ONETOUCH DELICA LANCETS 33G MISC    by Does not apply route.   PANTOPRAZOLE (PROTONIX) 40 MG TABLET    TAKE 1 TABLET BY MOUTH EVERY DAY   PROBIOTIC PRODUCT (PROBIOTIC DAILY PO)    Take by mouth.   RYBELSUS 14 MG TABS  SODIUM CHLORIDE (OCEAN) 0.65 % SOLN NASAL SPRAY    Place 2 sprays into both nostrils every 2 (two) hours while awake.   TADALAFIL (CIALIS) 20 MG TABLET    TAKE 1/2 TO 1 TABLET BY MOUTH AS NEEDED   TOUJEO SOLOSTAR 300 UNIT/ML SOPN    6 Units by Subdermal route daily.  Modified Medications   Modified Medication Previous Medication   BICTEGRAVIR-EMTRICITABINE-TENOFOVIR AF (BIKTARVY) 50-200-25 MG TABS TABLET bictegravir-emtricitabine-tenofovir AF (BIKTARVY) 50-200-25 MG TABS tablet      Take 1 tablet by mouth daily.    Take 1 tablet by mouth daily.  Discontinued Medications   No medications on file    Subjective: Frederick Abbott is in for his routine HIV follow-up visit.  He denies any problems obtaining, taking or tolerating his Biktarvy.  He cannot recall missing a dose.  He recently had some problems with his co-pay for his diabetes medications but that has been sorted out and they are very affordable now.  He is still seeing his therapist every month but feels  like his grief reaction is much improved.  Overall he is feeling well.  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  Review of Systems: Review of Systems  Constitutional: Negative for fever.  Psychiatric/Behavioral: Negative for depression. The patient is not nervous/anxious.     Past Medical History:  Diagnosis Date  . Anxiety   . Belching   . Cholecystitis chronic   . Depression   . Diarrhea    "comes and goes" (12/14/2017)  . GERD (gastroesophageal reflux disease)   . Hepatitis A   . Hepatitis B   . HIV (human immunodeficiency virus infection) (HCC) dx'd 12/18/1995  . Hypercholesterolemia   . Neuromuscular disorder (HCC)    neuropathy  . Neuropathy   . Pneumonia    childhood  . Seasonal allergies   . Seizures (HCC)    "as a child, none since age 1; sister had them too" (12/14/2017)  . Type II diabetes mellitus (HCC)     Social History   Tobacco Use  . Smoking status: Former Smoker    Packs/day: 0.80    Years: 10.00    Pack years: 8.00    Types: Cigarettes    Quit date: 05/06/2006    Years since quitting: 14.8  . Smokeless tobacco: Never Used  Vaping Use  . Vaping Use: Never used  Substance Use Topics  . Alcohol use: Yes    Alcohol/week: 2.0 standard drinks    Types: 2 Cans of beer per week  . Drug use: Not Currently    Types: Marijuana    Comment: 12/14/2017 'last used 1 wk ago"    Family History  Problem Relation Age of Onset  . Cancer Mother        breast  . Cancer Maternal Grandmother        melanoma-nose  . Cancer Paternal Grandfather        melanoma-ear  . Colon cancer Neg Hx   . Esophageal cancer Neg Hx   . Rectal cancer Neg Hx   . Stomach cancer Neg Hx     Allergies  Allergen Reactions  . Phenytoin Other (See Comments)    Childhood pt unsure  . Metformin And Related Nausea And Vomiting  . Metformin Hcl Other (See Comments)  . Other Other (See Comments)    Health Maintenance  Topic Date Due  .  OPHTHALMOLOGY EXAM  01/03/2019  . INFLUENZA VACCINE  06/08/2021  .  HEMOGLOBIN A1C  06/21/2021  . FOOT EXAM  12/22/2021  . TETANUS/TDAP  08/10/2023  . COLONOSCOPY (Pts 45-39yrs Insurance coverage will need to be confirmed)  10/26/2025  . PNEUMOCOCCAL POLYSACCHARIDE VACCINE AGE 21-64 HIGH RISK  Completed  . COVID-19 Vaccine  Completed  . Hepatitis C Screening  Completed  . HIV Screening  Completed  . HPV VACCINES  Aged Out    Objective:  Vitals:   03/03/21 1501  BP: 123/78  Pulse: 88  Temp: 98 F (36.7 C)  TempSrc: Oral  SpO2: 98%  Weight: 172 lb (78 kg)  Height: 5\' 10"  (1.778 m)   Body mass index is 24.68 kg/m.  Physical Exam Constitutional:      Comments: He is in good spirits.  Cardiovascular:     Rate and Rhythm: Normal rate.  Pulmonary:     Effort: Pulmonary effort is normal.  Psychiatric:        Mood and Affect: Mood normal.     Lab Results Lab Results  Component Value Date   WBC 7.2 02/12/2021   HGB 16.0 02/12/2021   HCT 47.7 02/12/2021   MCV 89.2 02/12/2021   PLT 256 02/12/2021    Lab Results  Component Value Date   CREATININE 1.26 02/12/2021   BUN 17 02/12/2021   NA 139 02/12/2021   K 4.4 02/12/2021   CL 105 02/12/2021   CO2 24 02/12/2021    Lab Results  Component Value Date   ALT 32 02/12/2021   AST 24 02/12/2021   GGT 51 06/16/2018   ALKPHOS 102 12/22/2020   BILITOT 0.4 02/12/2021    Lab Results  Component Value Date   CHOL 198 12/22/2020   HDL 34.60 (L) 12/22/2020   LDLCALC 68 12/20/2019   LDLDIRECT 71.0 12/22/2020   TRIG (H) 12/22/2020    823.0 Triglyceride is over 400; calculations on Lipids are invalid.   CHOLHDL 6 12/22/2020   Lab Results  Component Value Date   LABRPR NON-REACTIVE 02/12/2021   HIV 1 RNA Quant  Date Value  02/12/2021 Not Detected Copies/mL  12/03/2019 <20 NOT DETECTED copies/mL  11/22/2018 <20 NOT DETECTED copies/mL   CD4 T Cell Abs (/uL)  Date Value  02/12/2021 866  12/03/2019 868  11/22/2018 990      Problem List Items Addressed This Visit      High   Human immunodeficiency virus (HIV) disease (HCC)    His infection has been under excellent, long-term control.  He will continue Biktarvy and follow-up after lab work in 1 year.      Relevant Medications   bictegravir-emtricitabine-tenofovir AF (BIKTARVY) 50-200-25 MG TABS tablet   Other Relevant Orders   CBC   T-helper cell (CD4)- ([facility] clinic only)   Comprehensive metabolic panel   Lipid panel   RPR   HIV-1 RNA quant-no reflex-bld        11/24/2018, MD [facility] for Infectious Disease [facility] [phone] pager   [phone] cell 03/03/2021, 3:46 PM

## 2021-03-14 NOTE — Telephone Encounter (Signed)
Patient came to clinic to notify staff he never developed symptoms; all covid home tests negative; feeling well no questions or concerns.  Returned to work onsite as scheduled.

## 2021-03-23 ENCOUNTER — Telehealth: Payer: Self-pay | Admitting: *Deleted

## 2021-03-23 NOTE — Telephone Encounter (Signed)
Pt emailed RN and Taskforce reporting nausea all day Sunday. Spoke with pt by phone today and he reports nausea is still present but not as severe. He was able to tolerate fluids and food today. Had not eaten in 24 hours prior to today.  Reports his CBGs were well controlled despite sx. He never developed vomiting or diarrhea or any other sx.  Pt ready to RTW tomorrow. No quarantine or testing indicated at this time. Personal medical. Cleared to RTW 5/17 unless new or worsening sx prior. Will f/u tomorrow to confirm.

## 2021-03-24 ENCOUNTER — Encounter: Payer: Self-pay | Admitting: *Deleted

## 2021-03-24 NOTE — Telephone Encounter (Signed)
Pt did RTW today as expected. Reports he feels well. Denies questions or concerns. Closing encounter.

## 2021-03-24 NOTE — Telephone Encounter (Signed)
Noted patient RTW today as expected and feeling well.  Reviewed RN Rolly Salter note

## 2021-03-24 NOTE — Telephone Encounter (Signed)
Reviewed RN Haley note agreed with plan of care. 

## 2021-04-13 ENCOUNTER — Encounter: Payer: Self-pay | Admitting: Registered Nurse

## 2021-04-13 ENCOUNTER — Telehealth: Payer: Self-pay | Admitting: Registered Nurse

## 2021-04-13 DIAGNOSIS — Z20822 Contact with and (suspected) exposure to covid-19: Secondary | ICD-10-CM

## 2021-04-13 NOTE — Telephone Encounter (Signed)
HR [employer] Tonya notified NP that pt reported exposure confirmed for 04/08/21 and currently asymptomatic fully vaccinated 2 boosters last May 2022.  HR recommended testing today home test day 5 6/6 and strict mask wear through 04/18/21 and monitoring for symptoms.  Voicemail left for patient today to contact me for further questions or concerns but I agreed with HR recommendations.  I will be in clinic tomorrow 09-12 and RN Rolly Salter was unexpectedly out of office today/clinic closed.  He can reach me at [phone].  HR notified agreed with plan of care; voicemail left for patient today.

## 2021-04-16 NOTE — Telephone Encounter (Signed)
Reviewed RN Rolly Salter note; patient home test results pending.

## 2021-04-16 NOTE — Telephone Encounter (Signed)
Called pt for 6/6 home test results and sx check. No answer at work or personal cell numbers. LVMRCB on both lines.

## 2021-04-18 NOTE — Telephone Encounter (Signed)
Patient contacted via telephone home test results negative and patient still asymptomatic.  Today is last day of strict mask wear and encounter closed.  Patient A&Ox3 spoke full sentences without difficulty no cough/nasal congestion/throat clearing audible during 2 minute telephone call.  HR notified.

## 2021-04-30 ENCOUNTER — Other Ambulatory Visit: Payer: Self-pay | Admitting: Family Medicine

## 2021-05-05 ENCOUNTER — Other Ambulatory Visit: Payer: Self-pay | Admitting: Family Medicine

## 2021-05-06 ENCOUNTER — Encounter: Payer: Self-pay | Admitting: *Deleted

## 2021-05-12 ENCOUNTER — Encounter: Payer: Self-pay | Admitting: Registered Nurse

## 2021-05-12 ENCOUNTER — Telehealth: Payer: Self-pay | Admitting: Registered Nurse

## 2021-05-12 DIAGNOSIS — E1165 Type 2 diabetes mellitus with hyperglycemia: Secondary | ICD-10-CM

## 2021-05-12 NOTE — Telephone Encounter (Signed)
Patient requested refill lisinopril 5mg  po daily from [facility].  Rx expired next appt with [facility] 06/19/2021.  Bridge refill lisinopril 10mg  take 1/2 tab po daily #90 RF0 dispensed to patient from [facility] today.  Last labs 02/12/2021 BUN/Cr WNL.  Patient to keep follow up appt with [facility] and obtain new Rx for future fills at [facility] formulary.  Last BP 123/78 on 03/03/21.  Was started on medication in 2015 for elevated urine microalbumin and Type II diabetes renal protection. Patient verbalized understanding information/instructions and had no further questions at this time.

## 2021-05-28 ENCOUNTER — Other Ambulatory Visit: Payer: Self-pay | Admitting: Family Medicine

## 2021-06-19 ENCOUNTER — Ambulatory Visit: Payer: PRIVATE HEALTH INSURANCE | Admitting: Family Medicine

## 2021-07-07 LAB — HEMOGLOBIN A1C: Hemoglobin A1C: 7

## 2021-07-18 ENCOUNTER — Other Ambulatory Visit: Payer: Self-pay | Admitting: Family Medicine

## 2021-07-20 NOTE — Telephone Encounter (Signed)
Patient is requesting a refill of the following medications: Requested Prescriptions   Pending Prescriptions Disp Refills   buPROPion (WELLBUTRIN XL) 300 MG 24 hr tablet [Pharmacy Med Name: BUPROPION HCL XL 300 MG TABLET] 90 tablet 0    Sig: TAKE 1 TABLET BY MOUTH EVERY DAY    Date of patient request: 07/18/2021 Last office visit: 12/22/2020 Date of last refill: 04/30/2021 Last refill amount: 90 tablets Follow up time period per chart: N/a

## 2021-08-05 ENCOUNTER — Encounter: Payer: Self-pay | Admitting: Registered Nurse

## 2021-08-05 ENCOUNTER — Telehealth: Payer: Self-pay | Admitting: Registered Nurse

## 2021-08-05 DIAGNOSIS — Z20822 Contact with and (suspected) exposure to covid-19: Secondary | ICD-10-CM

## 2021-08-05 MED ORDER — ALBUTEROL SULFATE HFA 108 (90 BASE) MCG/ACT IN AERS: 1.0000 | INHALATION_SPRAY | RESPIRATORY_TRACT | 0 refills | Status: DC | PRN

## 2021-08-05 NOTE — Telephone Encounter (Signed)
HR Tim notified NP staff to contact patient as close covid contact to another employee was disclosed to contact tracer (outside of work friend went to movies together).  Patient reported he tested today for covid negative.  Symptoms started yesterday Day 0 9/27 having nasal congestion, mild head pressure (not a headache), cough.  Using dayquil/nyquil.  Doesn't have inhaler at home would need refill.  Denied wheezing/throwing up after cough/shortness of breath.  Spouse asymptomatic at this time.  Discussed with patient noncovid URI, covid URI and GI upset in community.  FDA recommends repeat testing in 48 hours if symptomatic and first test negative.  Patient will home covid test again in 48 hours.  Patient notified today friend tested positive for covid 9/28 and last exposure 9/26.   Pt began quarantine today with symptoms. Patient did not develop symptoms of  trouble breathing, chest pain, fever or chills.   5 day quarantine per [facility] recommendations. Day 1 of quarantine was 08/05/21. Plan for repeat testing on Day 3 08/07/2021 home test.  Patient to contact me if vomiting after coughing or unable to tolerate po fluids.  Discussed flu and other viral illnesses circulating in community and some causing GI upset.  If GI upset I have recommended clear fluids then bland diet.  Avoid dairy/spicy, fried and large portions of meat while having nausea.  If vomiting hold po intake x 1 hour.  Then sips clear fluids like broths, ginger ale, power ade, gatorade, pedialyte may advance to soft/bland if no vomiting x 24 hours and appetite returned otherwise hydration main focus. Call me at work from home number if symptoms not improved with plan of care  patient to call if high fever, dehydration, marked weakness, fainting, increased abdominal pain, blood in stool or vomit (red or black).     Reviewed possible Covid symptoms including cough, shortness of breath with exertion or at rest, runny nose, congestion, sinus  pain/pressure, sore throat, fever/chills, body aches, fatigue, loss of taste/smell, GI symptoms of nausea/vomiting/diarrhea. Also reviewed same day/emergent eval/ER precautions of dizziness/syncope, confusion, blue tint to lips/face, severe shortness of breath/difficulty breathing/wheezing. Patient stated blood sugars have been running usual but his appetite decreased/not eating as much either.    Patient to isolate in own room and if possible use only one bathroom if living with others in home.  Wear mask when out of room to help prevent spread to others in household.  Sanitize high touch surfaces with lysol/chlorox/bleach spray or wipes daily as viruses are known to live on surfaces from 24 hours to days.  Patient has dayquil/nyquil at home for cough/cold use and is working for him. May use flonase nasal 1 spray each nostril BID prn rhinitis.  Dayquil and nyquil per manufacturer instructions.  Patient has not used tessalon pearles in the past and doesn't need at this time.  Electronic Rx for albuterol inhaler sent to his pharmacy of choice.  prn use protracting cough/wheezing/chest tightness.Discussed albuterol use with patient 1-2 puffs po q4-6 hours prn.   Discussed consider honey 1 tablespoon every 4 hours is a natural cough suppressant but caution due to his diabetes.  Avoid dehydration and drink water to keep urine pale yellow clear and voiding every 2-4 hours while awake. Discussed symptoms of bronchitis/pneumonia/sinusitis with patient to alert clinic staff if these occurring.  Patient alert and oriented x3, spoke full sentences without difficulty.  Some nasal congestion noted.  No cough/ throat clearing during 12 minute telephone call.  Discussed with patient he  can contact me at [email or web address].  My work from home number currently inoperable I will notify him when working again.  RN Rolly Salter in clinic Thursday 9/29 all day and am only 9/30 at [phone].   Pt verbalized understanding and agreement  with plan of care. No further questions/concerns at this time. Pt reminded to contact clinic with any changes in symptoms or questions/concerns. HR notified patient to quarantine through Day 5 2 Oct and repeat testing home day 3  08/07/21 pending along with strict mask wear through Day 10 7 Oct and no eating in employee lunch room.  Estimated return to work onsite 3 Oct 22

## 2021-08-06 NOTE — Telephone Encounter (Signed)
Spoke with pt by phone. Fatigue improved some today but other sx about the same. Cough worse in the morning after waking. No cough noted during 4 minute call. Recommended continuing Dayquil/Nyquil, using humidifier, steam shower, honey. Performing retesting tomorrow 9/30. Will call for results. Pt agreeable. Denies further questions or concerns.

## 2021-08-06 NOTE — Telephone Encounter (Signed)
Reviewed RN Haley note agreed with plan of care. 

## 2021-08-07 NOTE — Telephone Encounter (Signed)
Spoke with pt by phone. He reports repeat test today was negative. Fatigue and cough about the same as yesterday. Picked up inhaler today and will use that prn. Plans to repeat test on Sunday just because of sx and exposure to now positive friend. Weekend f/u. Denies questions or concerns currently.

## 2021-08-09 NOTE — Telephone Encounter (Signed)
Patient contacted via telephone reported had fatigue the past couple of days, stayed home watched movies until today energy level improved ran errands and feeling well.  Covid home test today negative.  Discussed mask wear not required at work unless coughing/new symptoms.  Patient to see RN Rolly Salter if new symptoms/worsening tomorrow when onsite and start mask wear.  Patient A&Ox3 spoke full sentences without difficulty no cough/congestion/throat clearing audible during 2 minute telephone call.  Patient verbalized understanding information/instructions, agreed with plan of care and had no further questions at this time.  HR notified Day 5 today asymptomatic and negative covid test may return onsite Day 6.

## 2021-08-10 NOTE — Telephone Encounter (Signed)
Noted patient returned to work as expected. 

## 2021-08-10 NOTE — Telephone Encounter (Signed)
Pt did RTW onsite today 10/3 as expected. Closing encounter.

## 2021-08-15 NOTE — Telephone Encounter (Signed)
Spoke with patient via telephone all symptoms resolved feeling well since RTW onsite. No questions or concerns.  Patient A&Ox3 spoke full sentences without difficulty no cough/congestion/throat clearing audible during 2 minute telephone call.

## 2021-08-20 ENCOUNTER — Encounter: Payer: Self-pay | Admitting: Registered Nurse

## 2021-08-20 ENCOUNTER — Telehealth: Payer: Self-pay | Admitting: Registered Nurse

## 2021-08-20 DIAGNOSIS — E162 Hypoglycemia, unspecified: Secondary | ICD-10-CM

## 2021-08-20 NOTE — Telephone Encounter (Signed)
Late entry  Patient came to clinic asking if we had any instaglucose gel.  Stated he has been battling hypoglycemia episodes since last week.  Has stopped his insulin and toujeo last week but took his rybelsus and jardiance oral this am.   Left message for his endocrinologist still waiting to hear back from provider. Tolerating po intake without difficulty.  Sipping on full sugar coke today, had rice for breakfast.  Hasn't had lunch yet.  Discussed adding protein to each meal to help keep blood sugar stable and not having swings from just sugar intake e.g. soda, candy (treating hypoglycemia).  Discussed he needs both carbohydrates and protein.  Patient given 2 instaglucose gels from clinic stock by [employer].  Patient wearing his freestyle libre monitor and has alarms set for glucose less than 70 alerts.  Stated last night alert woke him up and blood sugar in the 60s.  Had one day last week in the 60s also.  Patient to follow up with endocrinology this afternoon and if any further questions or concerns to notify clinic staff.  Patient A&Ox3 spoke full sentences without difficulty gait sure and steady skin warm dry and pink.

## 2021-08-20 NOTE — Telephone Encounter (Signed)
Late entry contacted via telephone by RN Rolly Salter and patient.  Patient still unable to reach endocrinology.  Freestyle Libre alarming 70 again.  Patient had not completely finished his coke today.  Finished now.  Discussed with patient I recommended once blood sugar over 100 again that he go home to rest.  15 grams sugar recommended every 15 minutes when readings less than 70.  Discussed 1 tablespoon of sugar/honey,  4 oz soda, 4 pieces hard sugar candy, 4 glucose tablets or 1 instaglucose gel given to patient from clinic.  Eat a snack that includes carbohydrate and protein if next meal more than an hour away.  Discussed hypoglycemia unawareness more likely over the next 2-3 days with his continued low readings today.  Keep his freestyle libre alarms on to assist him with recognizing when blood sugars are low.   Patient aware to ensure blood sugar over 100 before driving home as 20 minute drive or have some else drive him home.  Patient to have fast acting sugar sources with him in the car.  Patient to call his endocrinology office again now since they have not returned his call.  This is unusual for this office per patient.  Typically very responsive if he contacts this office. Common symptoms low blood sugar include shaking, sweating, nervousness/anxiety, irritability, confusion, dizziness and/or hunger.  Discussed with patient exceptionally low blood sugar is under 55.  If this occurs and he is unable to raise with fasting acting sugar needs to call for emergency assistance/911/ER.  Discussed he should not drink alcohol, exercise until his blood sugars back to normal and definitely not today.  Continue holding his insulin and other medications until he is able to talk with endocrinology.  Reviewed epocrates and half life jardiance 12 hours and rybelsus is 1 week.  Discussed with patient and RN Rolly Salter these will still both be working in his body to lower blood sugars tonight/tomorrow.  Patient to ensure he eats a  meal with carbs and protein before going to sleep tonight.  Snacks hourly until blood sugar consistently staying over 100.  Patient supervisor and coworkers aware of his diabetes and able to assist him if needed/when clinic closed.  Patient and RN Rolly Salter verbalized understanding information/instructions and had no further questions at this time.  Duration of call 18 minutes with patient and RN Rolly Salter.  Collaborating physician Dr Julieanne Manson notified of patient case and recommendations given to patient at 1607  No further recommendations at this time.  Continue plan of care as discussed.  Noted when Epic reviewed patient was Rx'd glucagon from  endocrinology.  Patient did not have at work today.  Left message at home if blood sugar in the 50s and not responding to oral glucose to use pen at home and repeat in 15 minutes if blood sugar continues in the 50s and call 911/go to ER if not improving after glucagon injection.

## 2021-08-20 NOTE — Telephone Encounter (Signed)
Late entry Patient coworker came to office stating patient having another hypoglycemia episode and required assistance at his desk.  Patient teary, sweating mildly at his desk.  Slow to respond to questions.  Patient free style libre alarming 70 alert.  Lowest 67 this am and highest reading 100.  Discussed with patient needs to sip full soda coke and eat lunch as still has not eaten.  Had 1 packet ginger tuna and sipped soda  at desk with clinic staff at side.  Spoke full sentences without difficulty and skin no longer damp to touch when I departed.  Respirations even and unlabored and movements coordinated.  Patient no longer teary.  Coworker went to get him [employer] chicken sandwich x 2 per patient request.  Freestyle libre readings improving trending up so I returned to clinic and RN Rolly Salter remained at patient desk.  RN Rolly Salter reviewed patient response after I left with me when she returned to clinic in 15 minutes.  RN Rolly Salter instructed patient to continue resting at desk, eat lunch and when she left desk glucose readings over 100 again.  Patient feeling better and verbalized understanding of regularly timed snacks with protein and carbohydrates today and no skipping meals.  Has a full sugar mountain dew and partial bottle of coke regular which had over 50 gram carbs per bottle.  Patient ate both chicken sandwiches and packet of tuna.  Patient to continue holding insulin and called endocrinology office again and left message unable to speak with nurse or provider.

## 2021-08-21 MED ORDER — GLUCAGON EMERGENCY 1 MG IJ KIT: 1.0000 mg | PACK | INTRAMUSCULAR | 2 refills | Status: AC | PRN

## 2021-08-21 NOTE — Telephone Encounter (Signed)
Spoke with Frederick Abbott in warehouse. He reports feeling better this morning. Libre monitor reading 211 at 0830 when speaking to Frederick Abbott. Sugar kept dropping last night but around 11p up to 200 and has stayed 120-220 since then.   Endocrinology returned his call at 1700 last night and advised not to take oral diabetes meds today and sent Rx for Glucagon pens. Sts he went to pick up pens and was told not covered by insurance and over $300 cash price. Frederick Abbott showed RN Rx and found it is for brand name Gvoke Glucagon. Manufacturer coupon located and Frederick Abbott called pharmacy and was advised coupon would take cost down to $60 but would have to be ordered and wouldn't arrive until Monday 10/17. Discussed with Frederick Abbott that Rx benefit website shows generic 2 pens covered with $51 co-insurance. Prefers to try this for availability instead. RN spoke with pharmacy and confirmed emergency kit covered with $10 copay. Rx placed and Frederick Abbott notified.   Frederick Abbott has not eaten anything so far today (as of 10am). CBG 169 currently. Brought [place] sandwich meat with him and will eat that soon. Reminded he should be having a carb and protein snack every 1-2 hours while working and continue closely monitoring sugar. Frederick Abbott verbalizes understanding and denies further questions. Confirmed he also has NP remote work phone number if needed this weekend when clinic is closed.

## 2021-08-21 NOTE — Telephone Encounter (Signed)
RN Rolly Salter contacted me via telephone and discussed glucagon options for patient.  Found manufacturer coupon to drop price to $60 but may have other options after speaking with pharmacist.  RN Rolly Salter contacted me again pharmacist found generic option covered by insurance and in stock/$10 copay. Electronic Rx entered to patient pharmacy of choice.  To administer if blood sugar less than 55 and may repeat in 15 minutes if no response to initial injection.  He should seek emergency care if needing to use glucagon.  Patient should also have po intake 15 grams of quick acting sugar with glucagon administration.  Reviewed RN Rolly Salter note and discussed via telephone that endocrinology instructed patient to hold all diabetes medications at this time. Endocrinology spoke with patient yesterday evening.   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  Patient needs to have regular po intake/meals/snacks.  RN Rolly Salter reiterated with patient.  Will follow up with patient via telephone this weekend.  Patient has after hours NP contact information also.  Agreed with plan of care.

## 2021-08-21 NOTE — Telephone Encounter (Signed)
Saw pt in warehouse at 1145. CBG 151. Has already eaten [place] and was going to lunch then. Feels well. Received notification from pharmacy that it is too early to pick up glucagon per insurance and he can pick up tomorrow 10/15. Recommended for pt to speak with pharmacy to see why since it hasn't filled any other glucagon or glucose replacement Rx. Denies further questions or concerns.

## 2021-08-22 NOTE — Telephone Encounter (Signed)
Patient reported his blood sugars have been elevated today but typically less than 200.  No further low blood sugar alerts overnight last night or today.  Not scheduled to work today.  Feeling well.  Discussed hydrating with water, healthy dietary choices and will follow up with patient again tomorrow regarding glucose readings.  Attending coworker wedding today.  Patient verbalized understanding information/instructions and no further questions at this time.

## 2021-08-24 ENCOUNTER — Other Ambulatory Visit: Payer: Self-pay | Admitting: Family Medicine

## 2021-08-25 ENCOUNTER — Encounter: Payer: Self-pay | Admitting: Family Medicine

## 2021-08-25 NOTE — Telephone Encounter (Signed)
Patient left message "Good afternoon!  I wanted to follow up with you regarding my numbers over the weekend.  Saturday, I ranged from 160 to 280.  After the wedding sometime around 9:30 pm, I took 5 units of fast acting insulin.  At 12:11 am, at bedtime, I took 8 units of Toujeo.  I ranged from 132 to 248, taking 12 units of fast acting insulin at 6:37 pm (the highest reading I had), and 10 units of Toujeo at 11:33 pm.  This morning, I took my Rybelsus, but not the Jardiance.  I took 10 units of fast acting at 9:11 am after having a snack, and 12 units of fast acting at 1:23 pm after lunch.  My range has been 129 to 223 today.  As of right now (1:43 pm), it is 196 and steady."  Replied to patient left message "Frederick Abbott,  Thanks for the update. I would continue this for the next day or two as I don't want you to be bottoming out again at work. Ensure you are drinking water as dehydration can worsen blood sugars and strain kidneys.   Did endocrinology advise anything different?  Sincerely   Albina Billet NP-C"  Patient replied that he hadn't been in contact with endocrinologist since last week. I encouraged him to follow up with endo also.  He also asked if he could get his flu shot today at onsite clinic and notified patient he could get his influenza vaccination.  Patient also had questions regarding his glucagon administration.  Discussed for patient to bring to clinic for teaching with RN Rolly Salter.   Notified patient glucagon can be injected in arm, leg or stomach. Discussed with patient this week I would rather his blood sugar be 120-150 before meals and 180-210 after right now.  He can continue sliding scale insulin. Discussed Glucagon use should be rare like an epi pen for emergencies   Using oral treatment glucose gel or sugar sources 15 grams every 15 minutes as needed for blood sugar less than 70 preferred   Patient verbalized understanding and had no further questions at this time.

## 2021-08-25 NOTE — Telephone Encounter (Signed)
Patient seen at [employer].  [employer] chocolate iced coffee with whipcream empty at desk.  Patient reviewed his sliding scale insulin dosing and Freestyle Libre glucose readings with me from Saturday to present.  Typically in the 115-180 but over 200 after meals.  Highest reading after cake "3 pieces" per patient at Saturday wedding.  Patient reported glucagon is vial and will need needles.  Discussed with patient to come to clinic for supplies and training.  Patient also reported [facility] is sending him messages glucagon can be picked up after 18 Oct.  Patient will check to see if this Rx if for pen versus vial.  Encouraged patient to continue sliding scale, hydrating with water and making healthy dietary choices.  Patient working in Nurse, adult.  A&Ox3 spoke full sentences without difficulty skin warm dry and pink.  Feeling well and no concerns this morning.

## 2021-08-27 NOTE — Telephone Encounter (Signed)
Patient came to clinic with glucagon.  Vial and needle in case.  Reviewed instructions with patient that saline from syringe must be injected into vial.  Gently swirl to mix until powder all clear then draw up 1mg  and inject into arm/leg or stomach like he does insulin.  Reviewed diagrams and equipment with patient along with manufacturer instructions.  Reiterated oral fast acting glucose is to be used for hypoglycemia initially 55-99 readings.  If dropping under 55 and unable to tolerate po or not responding than to inject glucagon.  Patient reviewed freestyle results for past 48 hours.  Yesterday high of 325.  Patient reported he had 1 alcoholic beverage and cake from a party yesterday.  Currently reading 120 and patient reported he took sliding scale insulin three times yesterday and twice today so far.  Skipped breakfast only had coffee with splenda for breakfast and ate 35 grams carbs for lunch today.  Patient has not had any further episodes hypoglycemia this week.  Low 120  High 325 in previous 24 hours.  Feeling well no concerns.  A&Ox3 spoke full sentences without difficulty gait sure and steady in clinic respirations even and unlabored.  Skin warm dry and pink.  Patient verbalized understanding information/instructions, agreed with plan of care and had no further questions at this time.

## 2021-09-09 NOTE — Telephone Encounter (Signed)
Late entry spoke with patient in workcenter 09/08/21 at 1000.  He stated back on all his regular medications and blood sugars running a little higher than normal due to Halloween snacking.  He is trying to make healthier choices today.  Encouraged hydrating, exercise, taking his medications as prescribed and avoiding further intake of high carbohydrate/added sugars foods/snacks today.  Patient A&Ox3 spoke full sentences without difficulty gait sure and steady respirations even and unlabored RA skin warm dry and pink.  Patient verbalized understanding information/instructions, agreed with plan of care and had no further questions at this time.

## 2021-09-18 ENCOUNTER — Encounter: Payer: Self-pay | Admitting: Family Medicine

## 2021-09-18 ENCOUNTER — Ambulatory Visit (INDEPENDENT_AMBULATORY_CARE_PROVIDER_SITE_OTHER): Payer: No Typology Code available for payment source | Admitting: Family Medicine

## 2021-09-18 VITALS — BP 110/78 | HR 88 | Temp 97.9°F | Resp 16 | Wt 167.8 lb

## 2021-09-18 DIAGNOSIS — E119 Type 2 diabetes mellitus without complications: Secondary | ICD-10-CM

## 2021-09-18 DIAGNOSIS — E781 Pure hyperglyceridemia: Secondary | ICD-10-CM

## 2021-09-18 LAB — LDL CHOLESTEROL, DIRECT: Direct LDL: 89 mg/dL

## 2021-09-18 LAB — BASIC METABOLIC PANEL
BUN: 19 mg/dL (ref 6–23)
CO2: 25 mEq/L (ref 19–32)
Calcium: 9.9 mg/dL (ref 8.4–10.5)
Chloride: 106 mEq/L (ref 96–112)
Creatinine, Ser: 1.27 mg/dL (ref 0.40–1.50)
GFR: 65.37 mL/min (ref >60.00)
Glucose, Bld: 124 mg/dL — ABNORMAL HIGH (ref 70–99)
Potassium: 4 mEq/L (ref 3.5–5.1)
Sodium: 139 mEq/L (ref 135–145)

## 2021-09-18 LAB — LIPID PANEL
Cholesterol: 173 mg/dL (ref 0–200)
HDL: 39.2 mg/dL (ref >39.00)
NonHDL: 134.21
Total CHOL/HDL Ratio: 4
Triglycerides: 354 mg/dL — ABNORMAL HIGH (ref 0.0–149.0)
VLDL: 70.8 mg/dL — ABNORMAL HIGH (ref 0.0–40.0)

## 2021-09-18 LAB — CBC WITH DIFFERENTIAL/PLATELET
Basophils Absolute: 0 10*3/uL (ref 0.0–0.1)
Basophils Relative: 0.6 % (ref 0.0–3.0)
Eosinophils Absolute: 0.2 10*3/uL (ref 0.0–0.7)
Eosinophils Relative: 3.1 % (ref 0.0–5.0)
HCT: 47.7 % (ref 39.0–52.0)
Hemoglobin: 16 g/dL (ref 13.0–17.0)
Lymphocytes Relative: 36.2 % (ref 12.0–46.0)
Lymphs Abs: 2.5 10*3/uL (ref 0.7–4.0)
MCHC: 33.6 g/dL (ref 30.0–36.0)
MCV: 90.1 fl (ref 78.0–100.0)
Monocytes Absolute: 0.7 10*3/uL (ref 0.1–1.0)
Monocytes Relative: 9.9 % (ref 3.0–12.0)
Neutro Abs: 3.4 10*3/uL (ref 1.4–7.7)
Neutrophils Relative %: 50.2 % (ref 43.0–77.0)
Platelets: 275 10*3/uL (ref 150.0–400.0)
RBC: 5.29 Mil/uL (ref 4.22–5.81)
RDW: 13.2 % (ref 11.5–15.5)
WBC: 6.8 10*3/uL (ref 4.0–10.5)

## 2021-09-18 LAB — TSH: TSH: 1.73 u[IU]/mL (ref 0.35–5.50)

## 2021-09-18 LAB — HEPATIC FUNCTION PANEL
ALT: 30 U/L (ref 0–53)
AST: 22 U/L (ref 0–37)
Albumin: 4.6 g/dL (ref 3.5–5.2)
Alkaline Phosphatase: 70 U/L (ref 39–117)
Bilirubin, Direct: 0.1 mg/dL (ref 0.0–0.3)
Total Bilirubin: 0.5 mg/dL (ref 0.2–1.2)
Total Protein: 6.7 g/dL (ref 6.0–8.3)

## 2021-09-18 NOTE — Assessment & Plan Note (Signed)
Chronic problem.  Following w/ Endo. Has eye exam scheduled for 11/29.  On ACE for renal protection.  UTD on eye exam.

## 2021-09-18 NOTE — Patient Instructions (Signed)
Schedule your complete physical in 6 months We'll notify you of your lab results and make any changes if needed Keep up the good work!  You look great! Have your eye doctor send me a copy of their report Call with any questions or concerns Stay Safe!  Stay Healthy! Happy Holidays!!!

## 2021-09-18 NOTE — Progress Notes (Signed)
   Subjective:    Patient ID: CARLO GUEVARRA, male    DOB: 1971/01/05, 50 y.o.   MRN: [id number]  HPI Hyperlipidemia- chronic problem, on Lipitor 10mg  daily and fenofibrate 160mg  daily.  Denies CP, SOB, abd pain, N/V.  DM- following w/ Endo.  Has eye exam scheduled for later this month (11/29).  On ACE for renal protection.  Pt has had multiple symptomatic lows recently.  Now has CGM   Review of Systems For ROS see HPI   This visit occurred during the SARS-CoV-2 public health emergency.  Safety protocols were in place, including screening questions prior to the visit, additional usage of staff PPE, and extensive cleaning of exam room while observing appropriate contact time as indicated for disinfecting solutions.      Objective:   Physical Exam Vitals reviewed.  Constitutional:      General: He is not in acute distress.    Appearance: Normal appearance. He is well-developed. He is not ill-appearing.  HENT:     Head: Normocephalic and atraumatic.  Eyes:     Extraocular Movements: Extraocular movements intact.     Conjunctiva/sclera: Conjunctivae normal.     Pupils: Pupils are equal, round, and reactive to light.  Neck:     Thyroid: No thyromegaly.  Cardiovascular:     Rate and Rhythm: Normal rate and regular rhythm.     Pulses: Normal pulses.     Heart sounds: Normal heart sounds. No murmur heard. Pulmonary:     Effort: Pulmonary effort is normal. No respiratory distress.     Breath sounds: Normal breath sounds.  Abdominal:     General: Bowel sounds are normal. There is no distension.     Palpations: Abdomen is soft.  Musculoskeletal:     Cervical back: Normal range of motion and neck supple.     Right lower leg: No edema.     Left lower leg: No edema.  Lymphadenopathy:     Cervical: No cervical adenopathy.  Skin:    General: Skin is warm and dry.  Neurological:     General: No focal deficit present.     Mental Status: He is alert and oriented to person, place, and  time.     Cranial Nerves: No cranial nerve deficit.  Psychiatric:        Mood and Affect: Mood normal.        Behavior: Behavior normal.          Assessment & Plan:

## 2021-09-18 NOTE — Assessment & Plan Note (Signed)
Chronic problem.  Tolerating Lipitor and Fenofibrate w/o difficulty.  Check labs.  Adjust meds prn  

## 2021-10-03 ENCOUNTER — Other Ambulatory Visit: Payer: Self-pay | Admitting: Family Medicine

## 2021-10-06 LAB — HM DIABETES EYE EXAM

## 2021-10-12 ENCOUNTER — Other Ambulatory Visit: Payer: Self-pay | Admitting: Family Medicine

## 2021-10-19 ENCOUNTER — Telehealth: Payer: Self-pay | Admitting: Registered Nurse

## 2021-10-19 ENCOUNTER — Encounter: Payer: Self-pay | Admitting: Registered Nurse

## 2021-10-19 MED ORDER — ONDANSETRON 4 MG PO TBDP: 4.0000 mg | ORAL_TABLET | Freq: Three times a day (TID) | ORAL | 0 refills | Status: AC | PRN

## 2021-10-19 NOTE — Telephone Encounter (Signed)
Contacted by [employer] patient left work with fatigue, diarrhea and vomiting and to contact employee.   Patient contacted via telephone stated left work took 2 hour nap/fatigued and having upset stomach.  I thought it was related to my diabetes but now unsure since tired and took nap this evening.   Warming up soup again now to try eat something before bed.  Blood sugar 160 and has been in the green on his monitor for previous 6 hours.  I have recommended clear fluids and bland diet.  Avoid dairy/spicy, fried and large portions of meat while having nausea.  If vomiting hold po intake x 1 hour.  Then sips clear fluids like broths, ginger ale, power ade, gatorade, pedialyte may advance to soft/bland if no vomiting x 24 hours and appetite returned otherwise hydration main focus.  Zofran ODT generic 4mg  sig take 1-2 tabs po TID prn n/v #20 RF0 sent to his pharmacy of choice.  Patient last Rx 2021 from Dr 2022 no pills left.  Return to the clinic if symptoms persist or worsen; I have alerted the patient to call if high fever, dehydration, marked weakness, fainting, increased abdominal pain, blood in stool or vomit (red or black).   Exitcare handouts on diarrhea and nausea/vomiting sent to his my chart.  Discussed if new/worsening symptoms tomorrow to include URI will need to covid test.  24 hour stomach bug has been circulating in community since Thanksgiving also  Recommended gatorade/nondairy popsicles/ginger ale/broths first line if possible   HR notified patient not cleared to return onsite tomorrow and will be re-evaluated 10/20/21.  Patient A&Ox3 spoke full sentences without difficulty; frequent throat clearing noted during 10 minute telephone call; no audible cough or nasal congestion.  Patient verbalized agreement and understanding of treatment plan and had no further questions at this time.

## 2021-10-21 NOTE — Telephone Encounter (Signed)
Late entry spoke with patient at 0803 congested still sleeping.  Had not performed covid test yet.  Was able to tolerate po intake.  Still fatigued.  Discussed perform home covid test this afternoon and call me with results/symptom update.  Patient stated blood sugars had been in normal range previous 24 hours.  Did not require use of zofran in previous 24 hours.  Patient A&Ox3 spoke full sentences without difficulty nasal congestion noted during 4 minute telephone call.  HR notified covid test pending and follow up this afternoon not cleared to return onsite today.  Patient verbalized understanding information/instructions, agreed with plan of care and had no further questions at this time.  Patient returned call covid test negative feeling better.  Nasal congestion resolved.  He reported voice a little hoarse still from vomiting yesterday.  Patient to use normal call out procedures if new symptoms or feels unable to work tomorrow or if vomiting/diarrhea reoccur tonight.  Patient A&Ox3 spoke full sentences without difficulty no cough/congestion/throat clearing audible during 2 minute telephone call.  Patient verbalized understanding information/instructions, agreed with plan of care and had no further questions at this time.

## 2021-10-22 NOTE — Telephone Encounter (Signed)
Covid test yesterday was negative. Denies vomiting or fever but diarrhea continues. Reports at least 25 episodes today. Has tried eating rice with no change. CBG lowest has been 75 and easily correcting if needed. Recommended Imodium OTC and reviewed dosing. Hydrate well. Remain out of work tomorrow and f/u over the weekend.

## 2021-10-23 NOTE — Telephone Encounter (Signed)
Reviewed RN Rolly Salter note and agreed with plan of care.  Clinic staff will follow up with patient via telephone

## 2021-10-24 NOTE — Telephone Encounter (Signed)
Patient contacted via telephone and stated his first am stool was solid yesterday.  Patient has been eating bananas, rice, pedialyte and taking immodium.  Stools watery and decreased frequency "not every 10-15 minutes today"  yellow to green.  Average blood sugar 130 for the past 3-4 days.  "In range on continuous glucose monitor x 48 hours"  Urinating without difficulty.  Denied dizziness/headache/weakness/fever/chills/sweats.  Last episode of weakness.  Frequent throat clearing noted during telephone call duration 11 minutes. Patient A&Ox3 spoke full sentences without difficulty.   Patient asking if he can return to work Monday watery stools every 30 minutes needs to be resolved as he would not get any work done either if this was occurring at work.  Discussed with patient that carbohydrate heavy bland foods could also be contributing to diarrhea trial of yogurt tomorrow. Discussed continue hydrating with pedialyte.  Patient asked if he will be able to utilize lunch room discussed diarrhea must be resolved to utilize lunch room.  Patient notified me that he has multiple stools on daily basis some formed and other times soft or diarrhea. Discussed with patient cannot have worsening symptoms, fever/chills, new symptoms or diarrhea every 30-60 minutes and return to work.  Discussed with patient I will be traveling tomorrow night and cell service may be intermittent in evening tomorrow.  RN Rolly Salter back in office Monday morning [phone].  HR notified re-evaluation 10/25/21 GI upset continues.  Patient verbalized understanding information/instructions, agreed with plan of care and had no further questions at this time.

## 2021-10-25 NOTE — Telephone Encounter (Signed)
Patient contacted via telephone and stated one stool today at 1130.  Mostly passing gas now.  No imodium today. Tolerating po intake without difficulty. Patient notified he can return to work tomorrow as long as no worsening GI symptoms tonight.  Patient does not need to wear mask unless having nasal congestion/rhinitis/cough.  Patient may use break room/eat in lunch room if no GI upset or URI symptoms otherwise to eat in car or room across hall from clinic and wear mask at work.  HR notified.  Patient A&Ox3 spoke full sentences without difficulty.  Some throat clearing audible during 2 minute telephone call.  No cough/nasal congestion noted.  Patient verbalized understanding information/instructions, agreed with plan of care and had no further questions at this time.

## 2021-10-26 NOTE — Telephone Encounter (Signed)
Pt did RTW today as expected. Feeling well. Denies any current sx. Closing encounter.

## 2021-10-26 NOTE — Telephone Encounter (Signed)
Noted patient returned to work today and symptoms continue resolved.

## 2021-10-28 NOTE — Telephone Encounter (Signed)
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

## 2021-11-04 ENCOUNTER — Encounter: Payer: Self-pay | Admitting: Registered Nurse

## 2021-11-04 ENCOUNTER — Telehealth: Payer: Self-pay | Admitting: Registered Nurse

## 2021-11-04 NOTE — Telephone Encounter (Signed)
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  Patient reported he is taking his protonix every day.  Denied heartburn symptoms or post nasal drip.  Patient wanting to know if any other medication he can take.  Unsure if taking 20mg  or 40mg  dose.  Discussed if 20mg  dose can trial BID dosing.  If on 40mg  that is max dosing for long term use.  Patient will check his medication bottles at home and monitor symptoms to see if he notices post nasal drip or heartburn/reflux symptoms.  Discussed silent heartburn/reflux can irritate vocal cords/throat (stomach acid) or can be drip from sinuses falling onto vocal cords and irritating them/frequent throat clearing.  Patient using claritin 10mg  po daily.  Patient A&Ox3 spoke full sentences and throat clearing occurring every sentence or every other sentence.  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  Patient to follow up re-evaluation new or worsening symptoms. Consider follow up with GI/ENT. Patient verbalized understanding information/instructions, agreed with plan of care and had no further questions at this time.

## 2021-11-10 ENCOUNTER — Other Ambulatory Visit: Payer: Self-pay

## 2021-11-10 ENCOUNTER — Ambulatory Visit: Payer: Self-pay | Admitting: Registered Nurse

## 2021-11-10 ENCOUNTER — Encounter: Payer: Self-pay | Admitting: Registered Nurse

## 2021-11-10 VITALS — BP 119/78 | HR 88 | Temp 98.8°F

## 2021-11-10 DIAGNOSIS — L03032 Cellulitis of left toe: Secondary | ICD-10-CM

## 2021-11-10 MED ORDER — CEPHALEXIN 500 MG PO CAPS: 500.0000 mg | ORAL_CAPSULE | Freq: Two times a day (BID) | ORAL | 0 refills | Status: AC

## 2021-11-10 NOTE — Patient Instructions (Signed)

## 2021-11-10 NOTE — Progress Notes (Signed)
Subjective:    Patient ID: Frederick Abbott, male    DOB: 01-May-1971, 51 y.o.   MRN: [id number]  50y/o Caucasian established male pt c/o wound between L 4th and 5th toes that he noted 4 days ago. Progressively worsening. Noted pain first then swelling. Pt showed RN area on Friday when it started and no obvious wound was present. Yesterday, callous-like area with dark central pinpoint was noted to lateral 4th toe with mild swelling was present 4th and 5th toes, but worsened today. Redness also now present today. He has also noticed a similar wound to R 4th toe as well today.  Has been applying cream from his provider but doesn't seem to be helping as pain and swelling worsening.  RN Hildred Alamin stated on initial evaluation with her freckle on toe noted not part of inflamed area and today freckle on toe centrally located in toe swelling/redness bilaterally.  Patient reported pain worsening and wearing shoes uncomfortable.  Denied fever/chills/discharge/bleeding/known trauma.     Review of Systems  Constitutional:  Negative for activity change, appetite change, chills, diaphoresis, fatigue and fever.  HENT:  Negative for trouble swallowing and voice change.   Eyes:  Negative for photophobia and visual disturbance.  Respiratory:  Negative for cough, shortness of breath, wheezing and stridor.   Cardiovascular:  Negative for leg swelling.  Gastrointestinal:  Negative for diarrhea, nausea and vomiting.  Endocrine: Negative for cold intolerance and heat intolerance.  Genitourinary:  Negative for difficulty urinating.  Musculoskeletal:  Positive for myalgias. Negative for arthralgias, back pain, gait problem, joint swelling, neck pain and neck stiffness.  Skin:  Positive for color change and rash. Negative for pallor and wound.  Neurological:  Negative for tremors, seizures, syncope, speech difficulty, weakness and numbness.  Hematological:  Negative for adenopathy. Does not bruise/bleed easily.   Psychiatric/Behavioral:  Negative for agitation, confusion and sleep disturbance.       Objective:   Physical Exam Vitals and nursing note reviewed.  Constitutional:      General: He is awake. He is not in acute distress.    Appearance: Normal appearance. He is well-developed and well-groomed. He is not ill-appearing, toxic-appearing or diaphoretic.  HENT:     Head: Normocephalic and atraumatic.     Jaw: There is normal jaw occlusion.     Salivary Glands: Right salivary gland is not diffusely enlarged. Left salivary gland is not diffusely enlarged.     Right Ear: Hearing and external ear normal.     Left Ear: Hearing and external ear normal.     Nose: Nose normal. No congestion or rhinorrhea.     Mouth/Throat:     Lips: Pink. No lesions.     Mouth: Mucous membranes are moist.     Pharynx: Oropharynx is clear.  Eyes:     General: Lids are normal. Vision grossly intact. Gaze aligned appropriately. No scleral icterus.       Right eye: No discharge.        Left eye: No discharge.     Extraocular Movements: Extraocular movements intact.     Conjunctiva/sclera: Conjunctivae normal.     Pupils: Pupils are equal, round, and reactive to light.  Neck:     Trachea: Trachea normal.  Cardiovascular:     Rate and Rhythm: Normal rate and regular rhythm.     Pulses: Normal pulses.  Pulmonary:     Effort: Pulmonary effort is normal. No respiratory distress.     Breath sounds: Normal breath sounds and  air entry. No stridor or transmitted upper airway sounds. No wheezing, rhonchi or rales.     Comments: Spoke full sentences without difficulty; no cough observed in exam room Abdominal:     General: Abdomen is flat. There is no distension.  Musculoskeletal:        General: Swelling and tenderness present. Normal range of motion.     Cervical back: Normal range of motion and neck supple.       Feet:  Feet:     Right foot:     Skin integrity: Warmth, callus and dry skin present.     Toenail  Condition: Right toenails are normal.     Left foot:     Skin integrity: Warmth, callus and dry skin present.     Toenail Condition: Left toenails are normal.     Comments: Macular hyperpigmented nummular lesion center of macular erythema bilateral 4th toes adjacent to PIP joint laterally Lymphadenopathy:     Head:     Right side of head: No submandibular or preauricular adenopathy.     Left side of head: No submandibular or preauricular adenopathy.     Cervical:     Right cervical: No superficial cervical adenopathy.    Left cervical: No superficial cervical adenopathy.  Skin:    General: Skin is warm and dry.     Capillary Refill: Capillary refill takes less than 2 seconds.     Coloration: Skin is not ashen, cyanotic, jaundiced, mottled, pale or sallow.     Findings: Erythema and rash present. No abrasion, abscess, acne, bruising, burn, ecchymosis, signs of injury, laceration, lesion, petechiae or wound. Rash is macular. Rash is not crusting, nodular, papular, purpuric, pustular, scaling, urticarial or vesicular.     Nails: There is no clubbing.  Neurological:     General: No focal deficit present.     Mental Status: He is alert and oriented to person, place, and time. Mental status is at baseline.     GCS: GCS eye subscore is 4. GCS verbal subscore is 5. GCS motor subscore is 6.     Cranial Nerves: Cranial nerves 2-12 are intact. No cranial nerve deficit, dysarthria or facial asymmetry.     Sensory: Sensation is intact.     Motor: Motor function is intact. No weakness, tremor, atrophy, abnormal muscle tone or seizure activity.     Coordination: Coordination is intact. Coordination normal.     Gait: Gait is intact. Gait normal.     Comments: In/out of chair without difficulty; gait sure and steady in clinic; bilateral hand grasp equal 5/5  Psychiatric:        Attention and Perception: Attention and perception normal.        Mood and Affect: Mood and affect normal.        Speech:  Speech normal.        Behavior: Behavior normal. Behavior is cooperative.        Thought Content: Thought content normal.        Cognition and Memory: Cognition and memory normal.        Judgment: Judgment normal.          Assessment & Plan:   A-Exitcare handout on skin infection. RTC if worsening erythema, pain, purulent discharge, fever. Wash towels, washcloths, sheets in hot water with bleach every couple of days until infection resolved. Wash area with soap and water at least daily.  Apply aquaphor ointment at least daily.  Start cephalexin 500mg  po BID x 7  days #30 RF0 dispensed from [facility] to patient.  Avoid tight fitting shoes.  Stop cream from his other provider.  Follow up for re-evaluation Thursday 11/12/21 as clinic closed Wednesdays/tomorrow 11/11/2021.  Patient verbalized understanding, agreed with plan of care and had no further questions at this time.

## 2021-11-10 NOTE — Telephone Encounter (Signed)
Patient seen in clinic for another complaint today.  Less throat clearing noted.  Patient has not changed any medications and stated seems the same as usual regarding frequency.  Patient verified taking 40mg  po daily protonix.

## 2021-11-12 ENCOUNTER — Telehealth: Payer: Self-pay | Admitting: *Deleted

## 2021-11-12 ENCOUNTER — Encounter: Payer: Self-pay | Admitting: *Deleted

## 2021-11-12 DIAGNOSIS — U071 COVID-19: Secondary | ICD-10-CM

## 2021-11-12 DIAGNOSIS — Z20822 Contact with and (suspected) exposure to covid-19: Secondary | ICD-10-CM

## 2021-11-12 NOTE — Telephone Encounter (Signed)
RN Rolly Salter discussed patient case with NP in clinic earlier today.  Reviewed note agreed with plan of care.  Exitcare handout covid isolation sent to patient.

## 2021-11-12 NOTE — Telephone Encounter (Signed)
Taskforce notified clinic that pt called out of work today reporting partner having positive covid test this morning after a few days of sx. Pt with congestion this morning, possibly worse than his usual morning congestion/throat clearing that is typical. Otherwise feels well and denies other sx. Frequent throat clearing noted during phone call but this is also recurrent for pt and has been discussed on multiple occasions in the clinic.  Negative covid test today but keep out of work until 2nd test in 48hr is negative. If negative and no new or worsening sx, will be able to RTW 1/9 as next scheduled workday. If positive, will remain out of work and contact clinic.  Weekend f/u for re-eval and clearance to RTW.

## 2021-11-15 MED ORDER — MOLNUPIRAVIR EUA 200MG CAPSULE: 4.0000 | ORAL_CAPSULE | Freq: Two times a day (BID) | ORAL | 0 refills | Status: AC

## 2021-11-15 NOTE — Telephone Encounter (Signed)
Patient contacted via telephone and stated positive home covid test yesterday x 2.  Patient would like antivirals.   Reviewed [facility] drug interactions with patient medication list no contraindication to molnupiravir.  There was chronic medications that are not recommended with paxlovid. Current symptoms nasal congestion and coughing productive mild intermittent per patient.  Denied fever, chills, vomiting, diarrhea, or wheezing.  Has noticed some shortness of breath with exertion.   Patient has albuterol inhaler at home for prn use.  Discussed RN Rolly Salter will follow up with him tomorrow regarding symptoms and if dyspnea on exertion worsening. [facility] [address] his second pharmacy preference if not in stock at his usual pharmacy.  Patient tolerating po intake without difficulty and blood sugar average this week 163.  Day 5 1/12 RTW estimated 11/20/21 with mask wear and no eating in employee lunch room.  Discussed may eat outside, in car or use lactation/office across from clinic for eating while at work without mask.  Patient information handout on molnupiravir sent to patient my chart and discussed common side effects GI upset, bad taste in mouth with patient.  Discussed hydrate with water to keep urine pale yellow clear and voiding every 4 hours when awake. Patient partner also sick with covid.  Moderate risk of covid complications per Epic review.  Exitcare handout on home care for symptoms and covid quarantine sent to patient my chart.  Discussed with patient I will call him if primary pharmacy does not have molnupiravir in stock.  Patient stated he also [facility] and gets messages when Rx ready.  Patient A&Ox3 spoke full sentences without difficulty.  Nasal congestion audible during 9 minute call.  No throat clearing or coughing audible.  HR notified positive covid test Day 0 11/14/21  Day 5 11/19/21 estimated RTW 11/20/2021 Day 10 11/24/2021.  Patient verbalized understanding information/instructions, agreed  with plan of care and had no further questions at this time.  Verified with pharmacy staff via telephone molnupiravir in stock and electronic Rx sent to his preferred pharmacy molnupiravir 200mg  sig t4 po BID x 5 days #40 RF0.

## 2021-11-16 NOTE — Telephone Encounter (Signed)
Reviewed RN Rolly Salter note symptoms slightly improved will follow up with patient again 11/17/2021 via telephone

## 2021-11-16 NOTE — Telephone Encounter (Signed)
Spoke with pt by phone. Sx unchanged from yesterday when speaking with NP.   Antivirals started yesterday. 2 doses in. Denies any side effects currently.   Denies ShOB with exertion. Reports he only noticed it once and it has not recurred.   Denies questions or concerns currently.

## 2021-11-17 ENCOUNTER — Encounter: Payer: Self-pay | Admitting: Family Medicine

## 2021-11-18 NOTE — Telephone Encounter (Signed)
Patient contacted via telephone and he stated feeling well denied concerns ready to be back at work on Friday 11/20/2021 with strict mask wear and no eating in employee lunch room through day 10 11/24/2021 Tuesday.  Today day 4 quarantine covid.  A&Ox3 spoke full sentences without difficulty.  No cough/congestion audible during 2 minute call some throat clearing noted.  HR notified estimated RTW 11/20/2021  Patient verbalized understanding information/instructions, agreed with plan of care and had no further questions at this time.

## 2021-11-20 NOTE — Telephone Encounter (Signed)
Pt left VM for clinic advising that he did RTW today as expected and feels well. Mask wear thru Day 10 1/17.

## 2021-11-20 NOTE — Telephone Encounter (Signed)
Reviewed RN Haley note agreed with plan of care. 

## 2021-11-26 ENCOUNTER — Other Ambulatory Visit: Payer: Self-pay

## 2021-11-26 ENCOUNTER — Ambulatory Visit: Payer: Self-pay | Admitting: Registered Nurse

## 2021-11-26 VITALS — BP 139/90 | HR 81

## 2021-11-26 DIAGNOSIS — L03032 Cellulitis of left toe: Secondary | ICD-10-CM

## 2021-11-26 NOTE — Progress Notes (Signed)
Subjective:    Patient ID: Frederick Abbott, male    DOB: 1971-04-02, 51 y.o.   MRN: [id number]  51y/o Caucasian established male pt presenting for f/u L 4th toe cellulitis. Little toe and previously affected toe both with some swelling and tendering.  Does not feel it has improved much after 5 days of abx. Tender in morning but typically limping by lunch time because of swelling and not wanting to put pressure on it.   Last seen 11/10/2021 and started on cephalexin 500mg  po BID x 5 days.  Denied bleeding/discharge/fever/chills.  Able to wear closed toed shoed required by work but uncomfortable.  Not applying emollient to feet daily after washing.     Review of Systems  Constitutional:  Negative for chills, diaphoresis, fatigue and fever.  HENT:  Negative for trouble swallowing and voice change.   Eyes:  Negative for photophobia and visual disturbance.  Respiratory:  Negative for cough, shortness of breath, wheezing and stridor.   Cardiovascular:  Negative for leg swelling.  Gastrointestinal:  Negative for diarrhea, nausea and vomiting.  Endocrine: Negative for cold intolerance and heat intolerance.  Genitourinary:  Negative for difficulty urinating.  Musculoskeletal:  Positive for gait problem and myalgias. Negative for arthralgias, joint swelling, neck pain and neck stiffness.  Skin:  Positive for color change and rash. Negative for pallor and wound.  Allergic/Immunologic: Positive for environmental allergies and immunocompromised state. Negative for food allergies.  Neurological:  Negative for tremors, seizures, syncope, speech difficulty, weakness and numbness.  Hematological:  Negative for adenopathy. Does not bruise/bleed easily.  Psychiatric/Behavioral:  Negative for agitation, confusion and sleep disturbance.       Objective:   Physical Exam Vitals and nursing note reviewed.  Constitutional:      General: He is awake. He is not in acute distress.    Appearance: Normal  appearance. He is well-developed and well-groomed. He is not ill-appearing, toxic-appearing or diaphoretic.  HENT:     Head: Normocephalic and atraumatic.     Jaw: There is normal jaw occlusion.     Salivary Glands: Right salivary gland is not diffusely enlarged. Left salivary gland is not diffusely enlarged.     Right Ear: Hearing and external ear normal.     Left Ear: Hearing and external ear normal.     Nose: Nose normal. No congestion or rhinorrhea.     Mouth/Throat:     Lips: Pink. No lesions.     Mouth: Mucous membranes are moist.     Pharynx: Oropharynx is clear.  Eyes:     General: Lids are normal. Vision grossly intact. Gaze aligned appropriately. No scleral icterus.       Right eye: No discharge.        Left eye: No discharge.     Extraocular Movements: Extraocular movements intact.     Conjunctiva/sclera: Conjunctivae normal.     Pupils: Pupils are equal, round, and reactive to light.  Neck:     Trachea: Trachea normal.  Cardiovascular:     Rate and Rhythm: Normal rate and regular rhythm.     Pulses:          Posterior tibial pulses are 2+ on the left side.  Pulmonary:     Effort: Pulmonary effort is normal. No respiratory distress.     Breath sounds: Normal breath sounds and air entry. No stridor or transmitted upper airway sounds. No wheezing.     Comments: Spoke full sentences without difficulty; no cough observed in exam  room Abdominal:     General: Abdomen is flat.  Musculoskeletal:        General: Swelling and tenderness present. Normal range of motion.     Cervical back: Normal range of motion and neck supple.     Right lower leg: No edema.     Left lower leg: No edema.     Left foot: Normal range of motion. No deformity or bunion.       Feet:  Feet:     Left foot:     Skin integrity: Blister, erythema, warmth, callus and dry skin present. No fissure.  Lymphadenopathy:     Head:     Right side of head: No submandibular or preauricular adenopathy.      Left side of head: No submandibular or preauricular adenopathy.     Cervical: No cervical adenopathy.     Right cervical: No superficial cervical adenopathy.    Left cervical: No superficial cervical adenopathy.  Skin:    General: Skin is warm and dry.     Capillary Refill: Capillary refill takes less than 2 seconds.     Coloration: Skin is not ashen, cyanotic, jaundiced, mottled, pale or sallow.     Findings: Erythema and rash present. No abrasion, abscess, acne, bruising, burn, ecchymosis, signs of injury, laceration, lesion, petechiae or wound. Rash is macular and scaling. Rash is not crusting, nodular, papular, purpuric, pustular, urticarial or vesicular.     Nails: There is no clubbing.          Comments: 4th and 5th distal to DIP joints macular erythema and 1+/4 nonpitting edema. Scaling noted moccasin pattern left foot all toes and plantar surfaces; no discharge noted on socks/skin; lateral 4th digit has hyperpigmented nummular lesion and noted that epidermis disrupted without bleeding 14mm appears to be popped blister  Neurological:     General: No focal deficit present.     Mental Status: He is alert and oriented to person, place, and time. Mental status is at baseline.     GCS: GCS eye subscore is 4. GCS verbal subscore is 5. GCS motor subscore is 6.     Cranial Nerves: Cranial nerves 2-12 are intact. No cranial nerve deficit, dysarthria or facial asymmetry.     Sensory: Sensation is intact.     Motor: Motor function is intact. No weakness, tremor, atrophy, abnormal muscle tone or seizure activity.     Coordination: Coordination is intact. Coordination normal.     Gait: Gait is intact. Gait normal.     Comments: In/out of chair without difficulty; gait sure and steady in clinic; bilateral hand grasp equal 5/5  Psychiatric:        Attention and Perception: Attention and perception normal.        Mood and Affect: Mood and affect normal.        Speech: Speech normal.        Behavior:  Behavior normal. Behavior is cooperative.        Thought Content: Thought content normal.        Cognition and Memory: Cognition and memory normal.        Judgment: Judgment normal.          Assessment & Plan:   A-Cellulitis toes left subsequent visit  P-Suspect due to dry skin too tight shoes developed hot spot again.  Toenails well trimmed.  Exitcare handout on skin infection. RTC if worsening erythema, pain, purulent discharge, fever. Wash towels, washcloths, sheets in hot water with bleach  every couple of days until infection resolved. Wash area with soap and water at least daily.  Apply aquaphor ointment at least daily.  Restart cephalexin 500mg  po BID x 7 days previously dispensed from [facility] to patient.  Avoid tight fitting shoes.  Start aquaphor daily to bilateral feet after shower for dry skin.  Follow up for re-evaluation with NP Tuesday 12/01/21; sooner if new or worsening symptoms with RN Hildred Alamin.  Patient verbalized understanding, agreed with plan of care and had no further questions at this time.

## 2021-11-27 ENCOUNTER — Encounter: Payer: Self-pay | Admitting: Registered Nurse

## 2021-11-27 MED ORDER — CEPHALEXIN 500 MG PO CAPS: 500.0000 mg | ORAL_CAPSULE | Freq: Two times a day (BID) | ORAL | 0 refills | Status: AC

## 2021-11-27 MED ORDER — AQUAPHOR EX OINT: TOPICAL_OINTMENT | CUTANEOUS | 0 refills | Status: AC | PRN

## 2021-11-27 NOTE — Patient Instructions (Signed)

## 2021-12-01 ENCOUNTER — Ambulatory Visit: Payer: Self-pay | Admitting: Registered Nurse

## 2021-12-01 ENCOUNTER — Encounter: Payer: Self-pay | Admitting: Registered Nurse

## 2021-12-01 ENCOUNTER — Other Ambulatory Visit: Payer: Self-pay

## 2021-12-01 VITALS — BP 140/84 | HR 85 | Temp 98.1°F

## 2021-12-01 DIAGNOSIS — L03032 Cellulitis of left toe: Secondary | ICD-10-CM

## 2021-12-01 MED ORDER — SULFAMETHOXAZOLE-TRIMETHOPRIM 800-160 MG PO TABS
1.0000 | ORAL_TABLET | Freq: Two times a day (BID) | ORAL | 0 refills | Status: DC
Start: 2021-12-01 — End: 2021-12-08

## 2021-12-01 NOTE — Patient Instructions (Signed)

## 2021-12-01 NOTE — Progress Notes (Signed)
Subjective:    Patient ID: Frederick Abbott, male    DOB: [birth year], 51 y.o.   MRN: [id number]  50y/o Caucasian single established male pt presenting for f/u of L 4th and 5th toe cellulitis. Pt does not feel it has improved in past 5 days since last visit on 1/19. 4th toe remains red and swollen and 5th toe also red and swollen and now tender to touch. Redness now extending down lateral foot, new.  Finished keflex as instructed.  Has been applying aquaphor daily ointment to feet.  Switched to slip on canvas shoes instead of tennis shoes lace ups today.  Wearing socks.  Blood sugars 160 average for previous week.  Denied fever/chills.       Review of Systems  Constitutional:  Negative for activity change, appetite change, chills, diaphoresis, fatigue and fever.  HENT:  Negative for trouble swallowing and voice change.   Eyes:  Negative for photophobia and visual disturbance.  Respiratory:  Negative for cough, shortness of breath, wheezing and stridor.   Cardiovascular:  Negative for leg swelling.  Gastrointestinal:  Negative for nausea and vomiting.  Endocrine: Negative for cold intolerance and heat intolerance.  Genitourinary:  Negative for difficulty urinating.  Musculoskeletal:  Positive for gait problem. Negative for back pain, neck pain and neck stiffness.  Skin:  Positive for color change and rash. Negative for pallor and wound.  Allergic/Immunologic: Positive for environmental allergies. Negative for food allergies.  Neurological:  Negative for dizziness, tremors, seizures, syncope, facial asymmetry, speech difficulty, weakness, light-headedness, numbness and headaches.  Hematological:  Negative for adenopathy. Does not bruise/bleed easily.  Psychiatric/Behavioral:  Negative for agitation, confusion and sleep disturbance.       Objective:   Physical Exam Vitals and nursing note reviewed.  Constitutional:      General: He is awake. He is not in acute distress.    Appearance:  Normal appearance. He is well-developed, well-groomed and normal weight. He is not ill-appearing, toxic-appearing or diaphoretic.  HENT:     Head: Normocephalic and atraumatic.     Jaw: There is normal jaw occlusion.     Salivary Glands: Right salivary gland is not diffusely enlarged. Left salivary gland is not diffusely enlarged.     Right Ear: Hearing and external ear normal.     Left Ear: Hearing and external ear normal.     Nose: Nose normal. No congestion or rhinorrhea.     Mouth/Throat:     Lips: Pink. No lesions.     Mouth: Mucous membranes are moist.     Pharynx: Oropharynx is clear.  Eyes:     General: Lids are normal. Vision grossly intact. Gaze aligned appropriately. Allergic shiner present. No scleral icterus.       Right eye: No discharge.        Left eye: No discharge.     Extraocular Movements: Extraocular movements intact.     Conjunctiva/sclera: Conjunctivae normal.     Pupils: Pupils are equal, round, and reactive to light.  Neck:     Trachea: Trachea and phonation normal. No tracheal deviation.  Cardiovascular:     Rate and Rhythm: Normal rate and regular rhythm.     Pulses:          Radial pulses are 2+ on the right side and 2+ on the left side.       Dorsalis pedis pulses are 2+ on the right side and 2+ on the left side.  Pulmonary:     Effort:  Pulmonary effort is normal. No respiratory distress.     Breath sounds: Normal breath sounds and air entry. No stridor, decreased air movement or transmitted upper airway sounds. No decreased breath sounds, wheezing or rales.     Comments: Spoke full sentences without difficulty; no cough observed in exam room; intermittent throat clearing noted Abdominal:     General: Abdomen is flat.  Musculoskeletal:        General: Swelling and tenderness present. No deformity or signs of injury. Normal range of motion.     Cervical back: Normal range of motion and neck supple. No edema, erythema, rigidity, tenderness or crepitus. No  pain with movement. Normal range of motion.     Right lower leg: No swelling or tenderness. No edema.     Left lower leg: No swelling or tenderness. No edema.     Right foot: Normal range of motion and normal capillary refill. No swelling, laceration, tenderness, bony tenderness or crepitus.     Left foot: Normal range of motion and normal capillary refill. Swelling and tenderness present. No laceration, bony tenderness or crepitus.  Lymphadenopathy:     Head:     Right side of head: No submandibular or preauricular adenopathy.     Left side of head: No submandibular or preauricular adenopathy.     Cervical: No cervical adenopathy.     Right cervical: No superficial cervical adenopathy.    Left cervical: No superficial cervical adenopathy.  Skin:    General: Skin is warm and dry.     Capillary Refill: Capillary refill takes less than 2 seconds.     Coloration: Skin is not ashen, cyanotic, jaundiced, mottled, pale or sallow.     Findings: Erythema and rash present. No abrasion, abscess, acne, bruising, burn, ecchymosis, signs of injury, laceration, lesion, petechiae or wound. Rash is macular. Rash is not crusting, nodular, papular, purpuric, pustular, scaling, urticarial or vesicular.     Nails: There is no clubbing.          Comments: No scaling bilateral feet today moisturized; lateral left foot with patchy erythema and toes 4 and 5 2+/4 nonpitting edema TTP to PIP joint  Neurological:     General: No focal deficit present.     Mental Status: He is alert and oriented to person, place, and time. Mental status is at baseline.     GCS: GCS eye subscore is 4. GCS verbal subscore is 5. GCS motor subscore is 6.     Cranial Nerves: Cranial nerves 2-12 are intact. No cranial nerve deficit, dysarthria or facial asymmetry.     Motor: Motor function is intact. No weakness, tremor, atrophy, abnormal muscle tone or seizure activity.     Coordination: Coordination is intact. Coordination normal.      Gait: Gait is intact. Gait normal.     Comments: In/out of chair without difficulty; gait sure and steady in clinic; bilateral hand grasp equal 5/5  Psychiatric:        Attention and Perception: Attention and perception normal.        Mood and Affect: Mood and affect normal.        Speech: Speech normal.        Behavior: Behavior normal. Behavior is cooperative.        Thought Content: Thought content normal.        Cognition and Memory: Cognition and memory normal.        Judgment: Judgment normal.  Assessment & Plan:  A-cellulitis subsequent visit  P-Worsening erythema and swelling 4th and 5th toes.  Start bactrim DS po BID x 7 days and re-evaluate in 48 hours.  Dispensed from [facility] to patient #[id number].  Patient refused new AVS printout.  Continue washing with soap and water daily and applying emollient after bathing.  Avoid tight fitting shoes and scratching.  May use tylenol 1000mg  po q6h prn pain.  Cryotherapy 15 minutes QID prn pain/swelling and/or elevate feet also.  Patient verbalized understanding information/instructions, agreed with plan of care and had no further questions at this time.

## 2021-12-02 ENCOUNTER — Telehealth: Payer: Self-pay | Admitting: Registered Nurse

## 2021-12-02 ENCOUNTER — Encounter: Payer: Self-pay | Admitting: Registered Nurse

## 2021-12-02 DIAGNOSIS — L03032 Cellulitis of left toe: Secondary | ICD-10-CM

## 2021-12-02 NOTE — Telephone Encounter (Signed)
Patient contacted via telephone stated redness was resolved when he woke up this morning and pain also until he was on his feet at work for a couple hours and pain has returned now.  Discussed follow up in clinic tomorrow for re-evaluation, continue twice a day bactrim DS by mouth and wash area with soap and water daily and apply emollient.  Patient verbalized understanding information/instructions, agreed with plan of care and had no further questions at this time.

## 2021-12-03 NOTE — Telephone Encounter (Signed)
Pt seen in clinic by RN only. Redness improved along lateral foot. Color more pink/red than bright red like on Tues 1/24 to 4th and 5th toes, swelling slightly reduced. No new or worsening areas. Reports redness worsens through the day after standing and walking all day, looked better this morning at home. Continuing abx. Denies concerns. Will f/u in clinic again Monday for a recheck after completing current abx course on Sunday.

## 2021-12-03 NOTE — Telephone Encounter (Signed)
RN Rolly Salter discussed patient case with me today and showed me photos of patient foot.  Redness and swelling resolving.  Reviewed RN Rolly Salter note and agreed with plan of care and will follow up with patient via telephone Sunday for re-evaluation.

## 2021-12-06 ENCOUNTER — Other Ambulatory Visit: Payer: Self-pay | Admitting: Family Medicine

## 2021-12-07 NOTE — Telephone Encounter (Signed)
Reviewed RN Haley note agreed with plan of care. 

## 2021-12-07 NOTE — Telephone Encounter (Signed)
Pt to clinic reporting increase in pain at 4th and 5th toes L foot over the weekend. Redness about the same and swelling slightly worse than on Friday. No improvement since Friday despite abx. Last dose today. Advised pt to continue taking abx until NP appt set up for tomorrow 1/31. Pt also reports another similar looking area starting between 4th and 5th toes on R foot. No redness, pain, or swelling noted at this time. He called his endocrinology office to see if he needed to be seen in their office due to nonhealing, but provider only there til tomorrow, no appts available, and then out of office for 3 weeks.

## 2021-12-08 ENCOUNTER — Ambulatory Visit: Payer: Self-pay | Admitting: Registered Nurse

## 2021-12-08 ENCOUNTER — Other Ambulatory Visit: Payer: Self-pay

## 2021-12-08 ENCOUNTER — Encounter: Payer: Self-pay | Admitting: Registered Nurse

## 2021-12-08 VITALS — BP 127/85 | HR 90 | Temp 98.5°F

## 2021-12-08 DIAGNOSIS — L03032 Cellulitis of left toe: Secondary | ICD-10-CM

## 2021-12-08 DIAGNOSIS — L03031 Cellulitis of right toe: Secondary | ICD-10-CM

## 2021-12-08 DIAGNOSIS — L98491 Non-pressure chronic ulcer of skin of other sites limited to breakdown of skin: Secondary | ICD-10-CM

## 2021-12-08 MED ORDER — SULFAMETHOXAZOLE-TRIMETHOPRIM 800-160 MG PO TABS: 1.0000 | ORAL_TABLET | Freq: Two times a day (BID) | ORAL | 0 refills | Status: AC

## 2021-12-08 NOTE — Progress Notes (Signed)
Subjective:    Patient ID: Frederick Abbott, male    DOB: 19-Apr-1971, 51 y.o.   MRN: [id number]  50y/o Caucasian establsihed male pt presenting for f/u of toe wounds.  Last office visit 12/01/21 bactrim DS initiated and patient initially had decreased redness and pain.  Patient also changed footwear wearing at work prior to re-evaluation by [employer] 12/03/21.   Redness and swelling similar to yesterday on L foot, no major change. Now with ulcer starting on inside of R 4th toe, new since yesterday. Redness starting over anteriolateral 4th toe as well. Some redness along lateral aspects of both feet but these patterns appear to be related to shoe wear related/canvas rubbing on feet.  Patient still wearing canvas slip on shoes as less discomfort than when he wears lace up closed toe sneakers.  Denied fever/chills/discharge from wounds.  Has been applying emollient and taking bactrim DS twice a day.  Wondering if this time taking longer to heal as was sick GI bug prior to getting covid 1/7.  Rash similar to previous foot rashes but longest it has ever lasted.     Review of Systems  Constitutional:  Negative for activity change, appetite change, chills, diaphoresis, fatigue and fever.  HENT:  Negative for trouble swallowing and voice change.   Eyes:  Negative for photophobia and visual disturbance.  Respiratory:  Negative for cough, shortness of breath, wheezing and stridor.   Cardiovascular:  Negative for chest pain and leg swelling.  Gastrointestinal:  Negative for diarrhea, nausea and vomiting.  Endocrine: Negative for cold intolerance and heat intolerance.  Genitourinary:  Negative for difficulty urinating.  Musculoskeletal:  Positive for myalgias. Negative for back pain, gait problem, neck pain and neck stiffness.  Skin:  Positive for color change, rash and wound. Negative for pallor.  Allergic/Immunologic: Positive for environmental allergies. Negative for food allergies.  Neurological:   Negative for dizziness, tremors, seizures, syncope, facial asymmetry, speech difficulty, weakness, light-headedness and headaches.  Hematological:  Negative for adenopathy. Does not bruise/bleed easily.  Psychiatric/Behavioral:  Negative for agitation, confusion and sleep disturbance.       Objective:   Physical Exam Vitals and nursing note reviewed.  Constitutional:      General: He is awake. He is not in acute distress.    Appearance: Normal appearance. He is well-developed, well-groomed and normal weight. He is not ill-appearing, toxic-appearing or diaphoretic.  HENT:     Head: Normocephalic and atraumatic.     Jaw: There is normal jaw occlusion.     Salivary Glands: Right salivary gland is not diffusely enlarged. Left salivary gland is not diffusely enlarged.     Right Ear: Hearing and external ear normal.     Left Ear: Hearing and external ear normal.     Nose: Nose normal. No congestion or rhinorrhea.     Mouth/Throat:     Lips: Pink. No lesions.     Mouth: Mucous membranes are moist.     Pharynx: Oropharynx is clear.  Eyes:     General: Lids are normal. Vision grossly intact. Gaze aligned appropriately. Allergic shiner present. No scleral icterus.       Right eye: No discharge.        Left eye: No discharge.     Extraocular Movements: Extraocular movements intact.     Conjunctiva/sclera: Conjunctivae normal.     Pupils: Pupils are equal, round, and reactive to light.  Neck:     Trachea: Trachea and phonation normal. No tracheal deviation.  Cardiovascular:     Rate and Rhythm: Normal rate and regular rhythm.     Pulses: Normal pulses.          Radial pulses are 2+ on the right side and 2+ on the left side.       Dorsalis pedis pulses are 2+ on the right side and 2+ on the left side.  Pulmonary:     Effort: Pulmonary effort is normal.     Breath sounds: Normal breath sounds and air entry. No stridor, decreased air movement or transmitted upper airway sounds. No decreased  breath sounds or wheezing.     Comments: Spoke full sentences without difficulty; no cough observed in exam room Abdominal:     General: Abdomen is flat.  Musculoskeletal:        General: Swelling and tenderness present. Normal range of motion.     Cervical back: Normal range of motion and neck supple. No edema, erythema, signs of trauma, rigidity, tenderness or crepitus. No pain with movement. Normal range of motion.     Right lower leg: 1+ Edema present.     Left lower leg: 1+ Edema present.  Feet:     Right foot:     Skin integrity: Blister, skin breakdown and erythema present. No ulcer.     Left foot:     Skin integrity: Blister, skin breakdown and erythema present. No ulcer.  Lymphadenopathy:     Head:     Right side of head: No submandibular or preauricular adenopathy.     Left side of head: No submandibular or preauricular adenopathy.     Cervical: No cervical adenopathy.     Right cervical: No superficial cervical adenopathy.    Left cervical: No superficial cervical adenopathy.  Skin:    General: Skin is warm and dry.     Capillary Refill: Capillary refill takes less than 2 seconds.     Coloration: Skin is not ashen, cyanotic, jaundiced, mottled, pale or sallow.     Findings: Erythema, lesion and rash present. No abrasion, abscess, acne, bruising, burn, ecchymosis, signs of injury, laceration, petechiae or wound. Rash is macular. Rash is not crusting, nodular, purpuric, pustular, scaling or vesicular.     Nails: There is no clubbing.          Comments: Between 4th and 5th toes distal macular erythema and disruption of epidermis layer; macular erythema along lateral left foot to midfoot/arch, slightly tender dry; no discharge on skin or socks  Neurological:     General: No focal deficit present.     Mental Status: He is alert and oriented to person, place, and time. Mental status is at baseline.     GCS: GCS eye subscore is 4. GCS verbal subscore is 5. GCS motor subscore is  6.     Cranial Nerves: Cranial nerves 2-12 are intact. No cranial nerve deficit.     Sensory: Sensation is intact.     Motor: Motor function is intact. No weakness, tremor, atrophy, abnormal muscle tone or seizure activity.     Coordination: Coordination is intact. Coordination normal.     Gait: Gait is intact. Gait normal.     Comments: In/out of chair without difficulty; gait sure and steady in clinic; bilateral hand grasp equal 5/5  Psychiatric:        Attention and Perception: Attention and perception normal.        Mood and Affect: Mood and affect normal.        Speech: Speech  normal.        Behavior: Behavior normal. Behavior is cooperative.        Thought Content: Thought content normal.        Cognition and Memory: Cognition and memory normal.        Judgment: Judgment normal.   Contacted patient preferred podiatry office network Dr Rebekah Chesterfield staff stated patient could be seen tomorrow 1:30 at [facility] office location [place], [place].  Ambulatory referral sent electronically via epic.  Confirmed with office staff no fax or proficient required.  Left message Dr Odis Luster at [facility] Dr Miguel Aschoff at [phone] regarding patient case and to discuss if changes in antibiotics prior to podiatry appt 1330 09 Dec 2021 since patient case not improving and has failed cephalexin.  No changes at this time continue bactrim ds po BID.  Patient notified and will follow up with Dr Ellard Artis, [address] [place], [place] at 1:30 as scheduled Wednesday 09 Dec 2021.  Reiterated return to previous footwear as current footwear is compressing toes/new ulcers (skin disrupted) between toes 4 & 5 today.  Patient verbalized understanding information/instructions, agreed with plan of care and had no further questions at this time.     Assessment & Plan:   A-Cellulitis 4th and 5th toes bilaterally, skin ulcer limited to breakdown of skin on 4th toes bilaterally  P-continue bactrim DS po BID,  applying emollient daily after shower, avoid scratching.  May use tylenol 1000mg  po q6h prn pain.  Elevate feet when sitting.  Podiatry referral recommended along with imaging to rule out osteomyelitis due to patient immunocompromised and failed cephalexin and bactrim DS.  Wash with soap and water daily.  Change to shoes that are tie and wear laces loose so toes not rubbing on each other.  Discussed canvas is causing hot spots (pressure) on swollen toes and rubbing/skin breakdown occurring even through shoes feel more comfortable to patient.  Agreed with patient dry skin, diabetes, covid infection, HIV could all have impacted his immune response to this rash/injury.   Patient verbalized understanding information/instructions, agreed with plan of care and had no further questions at this time.

## 2021-12-08 NOTE — Telephone Encounter (Signed)
Patient seen in clinic today see office note and referred to podiatry has appt with Dr Logan Bores, [facility] office 2/1 at 1330.

## 2021-12-08 NOTE — Patient Instructions (Signed)
Pressure Injury °A pressure injury is damage to the skin and underlying tissue that results from pressure being applied to an area of the body. It often affects people who must spend a long time in a bed or chair because of a medical condition. °Pressure injuries usually occur: °Over bony parts of the body, such as the tailbone, shoulders, elbows, hips, heels, spine, ankles, and back of the head. °Under medical devices that make contact with the body, such as respiratory equipment, stockings, tubes, and splints. °Pressure injuries start as reddened areas on the skin and can lead to pain and an open wound. °What are the causes? °This condition is caused by frequent or constant pressure to an area of the body. Decreased blood flow to the skin can eventually cause the skin tissue to die and break down, causing a wound. °What increases the risk? °You are more likely to develop this condition if you: °Are in the hospital or an extended care facility. °Are bedridden or in a wheelchair. °Have an injury or disease that keeps you from: °Moving normally. °Feeling pain or pressure. °Have a condition that: °Makes you sleepy or less alert. °Causes poor blood flow. °Need to wear a medical device. °Have poor control of your bladder or bowel functions (incontinence). °Have poor nutrition (malnutrition). °If you are at risk for pressure injuries, your health care provider may recommend certain types of mattresses, mattress covers, pillows, cushions, or boots to help prevent them. These may include products filled with air, foam, gel, or sand. °What are the signs or symptoms? °Symptoms of this condition depend on the severity of the injury. Symptoms may include: °Red or dark areas of the skin. °Pain, warmth, or a change of skin texture. °Blisters. °An open wound. °How is this diagnosed? °This condition is diagnosed with a medical history and physical exam. You may also have tests, such as: °Blood tests. °Imaging tests. °Blood flow  tests. °Your pressure injury will be staged based on its severity. Staging is based on: °The depth of the tissue injury, including whether there is exposure of muscle, bone, or tendon. °The cause of the pressure injury. °How is this treated? °This condition may be treated by: °Relieving or redistributing pressure on your skin. This includes: °Frequently changing your position. °Avoiding positions that caused the wound or that can make the wound worse. °Using specific bed mattresses, chair cushions, or protective boots. °Moving medical devices from an area of pressure, or placing padding between the skin and the device. °Using foams, creams, or powders to prevent rubbing (friction) on the skin. °Keeping your skin clean and dry. This may include using a skin cleanser or skin barrier as told by your health care provider. °Cleaning your injury and removing any dead tissue from the wound (debridement). °Placing a bandage (dressing) over your injury. °Using medicines for pain or to prevent or treat infection. °Surgery may be needed if other treatments are not working or if your injury is very deep. °Follow these instructions at home: °Wound care °Follow instructions from your health care provider about how to take care of your wound. Make sure you: °Wash your hands with soap and water before and after you change your bandage (dressing). If soap and water are not available, use hand sanitizer. °Change your dressing as told by your health care provider.  °Check your wound every day for signs of infection. Have a caregiver do this for you if you are not able. Check for: °Redness, swelling, or increased pain. °More fluid   or blood. Warmth. Pus or a bad smell. Skin care Keep your skin clean and dry. Gently pat your skin dry. Do not rub or massage your skin. You or a caregiver should check your skin every day for any changes in color or any new blisters or sores (ulcers). Medicines Take over-the-counter and prescription  medicines only as told by your health care provider. If you were prescribed an antibiotic medicine, take or apply it as told by your health care provider. Do not stop using the antibiotic even if your condition improves. Reducing and redistributing pressure Do not lie or sit in one position for a long time. Move or change position every 1-2 hours, or as told by your health care provider. Use pillows or cushions to reduce pressure. Ask your health care provider to recommend cushions or pads for you. General instructions  Eat a healthy diet that includes lots of protein. Drink enough fluid to keep your urine pale yellow. Be as active as you can every day. Ask your health care provider to suggest safe exercises or activities. Do not abuse drugs or alcohol. Do not use any products that contain nicotine or tobacco, such as cigarettes, e-cigarettes, and chewing tobacco. If you need help quitting, ask your health care provider. Keep all follow-up visits as told by your health care provider. This is important. Contact a health care provider if: You have: A fever or chills. Pain that is not helped by medicine. Any changes in skin color. New blisters or sores. Pus or a bad smell coming from your wound. Redness, swelling, or pain around your wound. More fluid or blood coming from your wound. Your wound does not improve after 1-2 weeks of treatment. Summary A pressure injury is damage to the skin and underlying tissue that results from pressure being applied to an area of the body. Do not lie or sit in one position for a long time. Your health care provider may advise you to move or change position every 1-2 hours. Follow instructions from your health care provider about how to take care of your wound. Keep all follow-up visits as told by your health care provider. This is important. This information is not intended to replace advice given to you by your health care provider. Make sure you discuss  any questions you have with your health care provider. Document Revised: 05/24/2018 Document Reviewed: 05/24/2018 Elsevier Patient Education  [place]. Osteomyelitis, Adult Bone infections, also called osteomyelitis,occur when bacteria or other germs get inside a bone. This can happen if you have an infection in another part of your body that spreads through your blood. It can also happen if you have a wound or a broken bone (fracture) that breaks the skin. A wound or a fracture can allow germs from your skin or from outside of your body to spread to your bone. Bone infections need to be treated quickly to: Prevent bone damage. Prevent the infection from spreading to other areas of your body. What are the causes? Most bone infections are caused by bacteria. The most common bacteria is one found on the skin (staphylococcus). Bone infections can also be caused by other germs, such as viruses and funguses. What increases the risk? You are more likely to develop this condition if: You recently had surgery, especially bone or joint surgery. You have had an injury, such as stepping on a nail or having a fracture that exposes bones through the skin. You have a long-term (chronic) disease, such as:  Diabetes. HIV (human immunodeficiency virus). Rheumatoid arthritis. Sickle cell anemia. Kidney disease that requires dialysis. You have a condition that affects your body's defense system (immune system) or you take medicines that block or weaken the immune system. You have a condition that reduces your blood flow. You have an artificial joint. You have had a joint or bone repaired with plates or screws. You use IV drugs. What are the signs or symptoms? Symptoms vary depending on the type and location of your infection. Common symptoms of bone infections include: Fever and chills. Skin redness and warmth. Swelling. Pain and stiffness. Drainage of fluid or pus near the infection. How is  this diagnosed? This condition may be diagnosed based on: Your symptoms and medical history. A physical exam. Tests, such as: A sample of tissue, fluid, or blood taken to be examined under a microscope. Pus or discharge swabbed from a wound for testing. This is to identify the type of germs and to determine what type of medicine will kill them. Blood tests. Imaging studies, including X-rays, MRI, CT scan, bone scan, or ultrasound. How is this treated? Treatment for this condition depends on the cause and type of infection. Antibiotic medicines are usually the first treatment for a bone infection. This may be done in a hospital at first. You may have to continue IV antibiotics at home or take antibiotics by mouth for several weeks after that. Other treatments may include surgery to: Remove dead or dying tissue from a bone. Remove an infected artificial joint. Remove infected plates or screws that were used to repair a broken bone. Follow these instructions at home: Medicines Take over-the-counter and prescription medicines as told by your health care provider. Finish all antibiotic medicine even if you start to feel better. Follow instructions from your health care provider about how to take IV antibiotics at home. You may need to have a nurse come to your home to give you the IV antibiotics. Managing pain, stiffness, and swelling If directed, put ice on the affected area. To do this: Put ice in a plastic bag. Place a towel between your skin and the bag. Leave the ice on for 20 minutes, 2-3 times a day.  General instructions Ask your health care provider if you have any restrictions on your activities. Do not use any products that contain nicotine or tobacco, such as cigarettes, e-cigarettes, and chewing tobacco. If you need help quitting, ask your health care provider. Keep all follow-up visits as told by your health care provider. This is important. How is this prevented? Wash your  hands often to stop the spread of germs. Wash your hands for at least 20 seconds with soap and water. If soap and water are not available, use hand sanitizer. Keep any open areas, cuts, or wounds clean. Apply a clean bandage after cleaning the area. Check wounds frequently for signs of infections. Signs of infection include redness, swelling, warmth, pus, or a bad smell. Wear proper footwear to avoid injuries to the feet. Contact a health care provider if: You develop a fever or chills. You have redness, warmth, pain, or swelling that returns after treatment. Get help right away if: You have rapid breathing or you have trouble breathing. You have chest pain. You cannot drink fluids or make urine. The affected area swells, changes color, or turns blue. You have numbness or severe pain in the affected area. These symptoms may represent a serious problem that is an emergency. Do not wait to see if the  symptoms will go away. Get medical help right away. Call your local emergency services (911 in the [place] Do not drive yourself to the hospital. Summary Bone infections, also called osteomyelitis,occur when bacteria or other germs get inside a bone. You are more likely to get this type of infection if you have a condition that lowers your ability to fight infections. You are also likely to get this condition if you take medicines that block or weaken the immune system. Most bone infections are caused by bacteria. They can also be caused by other germs, such as viruses and funguses. Treatment for this condition usually starts with taking antibiotics. Further treatment depends on the cause and type of infection. This information is not intended to replace advice given to you by your health care provider. Make sure you discuss any questions you have with your health care provider. Document Revised: 01/10/2020 Document Reviewed: 01/10/2020 Elsevier Patient Education  [place].

## 2021-12-08 NOTE — Telephone Encounter (Signed)
No throat clearing in clinic today.  Encounter closed.

## 2021-12-09 ENCOUNTER — Ambulatory Visit (INDEPENDENT_AMBULATORY_CARE_PROVIDER_SITE_OTHER): Payer: No Typology Code available for payment source

## 2021-12-09 ENCOUNTER — Ambulatory Visit: Payer: No Typology Code available for payment source | Admitting: Podiatry

## 2021-12-09 DIAGNOSIS — L84 Corns and callosities: Secondary | ICD-10-CM

## 2021-12-09 NOTE — Telephone Encounter (Signed)
Patient seen by podiatry and diagnosed with callouses that ruptured.  Patient contacted via telephone and he stated foot redness/swelling and pain was decreased today.  He was wearing his lace up shoes today not slip ons and redness side of left foot was decreased today.  Patient reported he is going to buy new shoes this weekend.  Discussed running fingers and inspecting inside of new shoes to ensure no seams/overlapping fabric side of foot or over toes to cause pressure points/irritation.  Consider woven upper without seams over foot/toes.  Patient to follow up for re-evaluation in clinic tomorrow.  Patient stated podiatrist was knowledgeable/friendly and he will continue to follow up with him in the future for podiatry/diabetic foot needs.  Patient verbalized understanding information/instructions, agreed with plan of care and had no further questions at this time.

## 2021-12-09 NOTE — Progress Notes (Signed)
HPI: 51 y.o. male presenting today as a new patient referral from the patient's nurse practitioner for evaluation of what was previously diagnosed as cellulitis to the toes.  He is currently on antibiotics.  He says that he has painful lesions to the sides of the fourth and fifth digits bilateral.  He works on his feet all day long.  He presents for further treatment evaluation  Past Medical History:  Diagnosis Date   Anxiety    Belching    Cholecystitis chronic    Depression    Diarrhea    "comes and goes" (12/14/2017)   GERD (gastroesophageal reflux disease)    Hepatitis A    Hepatitis B    HIV (human immunodeficiency virus infection) (HCC) dx'd 12/18/1995   Hypercholesterolemia    Neuromuscular disorder (HCC)    neuropathy   Neuropathy    Pneumonia    childhood   Seasonal allergies    Seizures (HCC)    "as a child, none since age 79; sister had them too" (12/14/2017)   Type II diabetes mellitus (HCC)     Past Surgical History:  Procedure Laterality Date   APPENDECTOMY  12/14/2017   CHOLECYSTECTOMY  05/29/2012   Procedure: LAPAROSCOPIC CHOLECYSTECTOMY WITH INTRAOPERATIVE CHOLANGIOGRAM;  Surgeon: Wilmon Arms. Corliss Skains, MD;  Location: [facility];  Service: General;  Laterality: N/A;   JOINT REPLACEMENT     LAPAROSCOPIC APPENDECTOMY N/A 12/14/2017   Procedure: APPENDECTOMY LAPAROSCOPIC;  Surgeon: Andria Meuse, MD;  Location: [facility];  Service: General;  Laterality: N/A;   TONSILLECTOMY  1973   TOTAL HIP ARTHROPLASTY Bilateral 01/2005-08/2005   "left-right"    Allergies  Allergen Reactions   Phenytoin Other (See Comments)    Childhood pt unsure Other reaction(s): Unknown   Metformin And Related Nausea And Vomiting   Metformin Hcl Other (See Comments)    Other reaction(s): Upset Stomach   Other Other (See Comments)     Physical Exam: General: The patient is alert and oriented x3 in no acute distress.  Dermatology: No open wounds noted.  There is a hyperkeratotic callus noted  to the lateral aspect of the fourth toe adjacent to the fifth toe.  This is causing rubbing and irritation to the medial aspect of the fifth toe.  This is on the left foot  No hyperkeratotic calluses or actual lesions noted to the fourth and fifth digits of the right foot  Vascular: Palpable pedal pulses bilaterally. Capillary refill within normal limits.  There is some very slight associated erythema to the fourth and fifth digits secondary to friction that is caused from the rubbing of the toes.  I do not believe that this is associated to infection  Neurological: Light touch and protective threshold grossly intact  Musculoskeletal Exam: There is some slight adductovarus deformity noted to the fifth digits bilateral which causes him to sit under the fourth toe with weightbearing and loading of the foot which is contributing to the symptomatic callus lesions  Radiographic Exam:  Normal osseous mineralization. Joint spaces preserved. No fracture/dislocation/boney destruction.  Slight adductovarus deformity fourth and fifth bilateral  Assessment: 1.  Symptomatic corns/calluses interdigital area between the fourth and fifth toes left   Plan of Care:  1. Patient evaluated. X-Rays reviewed.  2.  Explained to the patient that he is simply dealing with symptomatic calluses secondary to friction and rubbing especially in close toed shoes.  I do not believe there is any acute infection.  He can go ahead and continue the oral  antibiotics until completed since they were prescribed 3.  Light debridement of the hyperkeratotic callus tissue was performed using a 312 scalpel without incident or bleeding 4.  Silicone toe spacers were provided for the patient to wear 5.  Recommend wide fitting shoes that do not constrict the toebox area 6.  Return to clinic as needed      Felecia Shelling, DPM [facility]  Dr. Felecia Shelling, DPM    [address] [place], [place] [address]                Office [phone]  Fax [phone]

## 2021-12-09 NOTE — Telephone Encounter (Signed)
Patient seen in clinic 11/26/21 denied cough/congestion/fever/vomiting/diarrhea.  See office note.  Encounter closed.

## 2021-12-10 NOTE — Telephone Encounter (Signed)
Patient seen in workcenter today.  He stated he has stopped bactrim DS all redness/pain resolved from bilateral feet and swelling minimal today toes.  Wearing toe sleeve from podiatry left 5th toe clean dry.  Gait sure and steady no limping in warehouse observed.  Skin warm dry and pink no erythema left foot/toes wearing lace up sneakers today.  Left foot exam only today.  Capillary refill less than 2 seconds left foot.  Patient stated he is going to buy new sneakers this weekend.  Will follow up for re-evaluation if erythema/swelling/pain returns.

## 2021-12-31 ENCOUNTER — Other Ambulatory Visit: Payer: Self-pay | Admitting: Family Medicine

## 2022-01-22 ENCOUNTER — Encounter: Payer: Self-pay | Admitting: Family Medicine

## 2022-01-22 ENCOUNTER — Telehealth: Payer: Self-pay | Admitting: Registered Nurse

## 2022-01-22 ENCOUNTER — Encounter: Payer: Self-pay | Admitting: Registered Nurse

## 2022-01-22 DIAGNOSIS — K219 Gastro-esophageal reflux disease without esophagitis: Secondary | ICD-10-CM

## 2022-01-22 DIAGNOSIS — F419 Anxiety disorder, unspecified: Secondary | ICD-10-CM

## 2022-01-22 NOTE — Telephone Encounter (Signed)
Notified by HR Ayah patient requested that I call him.  Patient contacted via telephone and reported that Monday around 9am,.I had a severe flare-up of my Genella Rife and I vomited for about 30 hours straight. Emesis bile colored.  Home covid test negative.  I didn't eat anything for 48 hours. I took my ondansetron, pepto as needed, tums here and there. I took off Wednesday and Thursday to build my strength back up. I ate bananas, Mashed potatoes, homemade potato leek soup, chicken noodle soup and made sure to drink plenty of water and pedialyte. Yesterday, I felt like I had a normal day and did a couple of things around the house (nothing too strenuous) and took naps as needed. I felt good and ready to go back to work. I ended up sending myself into a panic attack this morning as I woke up at 5, and by the time I got into my work parking lot, I couldn't even get out of the car. I came home and literally stayed in that panic attack mode until around 11. I took a Xanax to try and ease that, but it made me feel sluggish. He can't return to work until he gets a doctor's note.  Contacted PCM office via my chart for note.  He reported also took ondansetron today, took lavender bubble bath helped some but every time he thinks about food or work anxiety heightens again and teary.  Patient reported friend a reiki provider and will be coming later to do session with him.  Patient listening to relaxing music this afternoon.  Patient teary during telephone call.  Stated 24 hour blood sugar average 135 current 151 on continuous glucose monitor.  Urinating normal amounts.  Stools have been smaller than normal.  Reassured patient decreased stool size normal because po intake down this week.  Hydration the most important thing.  Don't force po solids.  Discussed he had stress response/fight or flight this morning as body still recovering from illness and stress at work worried about letting down coworkers.  New benchmarks at work and  patient feeling the stress.  Discussed a short walk will help him burn off the stress hormones also 5 minutes. Discussed poor sleep this week also impacting body/brain response.  Needs to get a couple more nights of good sleep.  Continue monitoring his blood sugars and follow endocrinology sick plan.   He has my contact number if new or worsening symptoms this weekend [phone] and to contact me if needed.  I will call him on Sunday to follow up prior to next scheduled work shift Monday 3/20.  Patient A&Ox3 spoke full sentences without difficulty.  No audible cough during 13 minute call.  Some nasal congestion/sniffing noted as teary when speaking and rare throat clearing noted.  Patient has support system feels safe at home just was unsure how to break this anxiety cycle.  Patient aware I cannot write RTW or work restrictions notes as prohibited by clinic contract.  Here for patient support/questions as needed.  Patient verbalized understanding information/instructions, agreed with plan of care and had no further questions at this time. ?

## 2022-01-24 ENCOUNTER — Encounter: Payer: Self-pay | Admitting: Family Medicine

## 2022-01-24 NOTE — Telephone Encounter (Signed)
Spoke with patient via telephone.  Patient reported decreased appetite and bile sensation in am upon awakening/acidy stomach.  Has had soup and ginger tea.  3 small bowel movements today and drinking water urinating normally.  Patient reported lying on right side worsens GERD symptoms.  Does not lie flat either as worsening GERD symptoms.  Has used antacids/tums prn when pantaprazole not working effectively.   Sometimes feels needs to spit out reflux and feels better afterwards. Previous 24 hours blood sugar average 155 and past week 151.  Has been using tums and or peptobismol.  Has not been taking his insulin, cholesterol medication or aspirin this week.  Discussed with patient my contract prohibits prescribing mental health medications.  Could consider buspar or hydroxyzine prn add on to his current medications for panic attacks since alprazolam making him too sedated/doesn't like how it makes him feel.  Was notified a friend died this week of sudden heart attack Friday evening and worsened his stress.  Going to talk with his supervisor to see if can take a week off unpaid.  Patient has been walking around house inside prn.  Took another bath relaxing, deep breathing exercises, essential oils.  Discussed tensing and releasing different muscle groups from head to toe as another exercise that can help with tension.  Took another home covid test today negative.  Discussed foods that can worsen GERD symptoms and sent handout to my chart.  Patient did have Reiki session Friday.  Patient reported urine yellow maybe cloudy.  Discussed increase water intake to 1.5 cups per hour if back pain, abdomen pain, fever, chills, smelly or cloudy urine continues will need to get seem/urinalysis.  Asked patient to palpate abdomen/epigastric and denied TTP.  Discussed with patient peptobismol has aspirin and multiple doses could irritate GI tract lining/worsen symptoms.  Discussed with patient famotidine/ranitidine can be added onto  pantoprazole for GERD in addition to TUMs/antacids.  Discussed to limit fried/spicy foods, acidy foods like tomatoes/citrus, full fall dairy or fatty cuts of meat/fried meats.  Exitcare handout on Food choices for GERD adult sent to my chart.  Patient reported home covid test negative.  Patient still stressed about missing too many days from work could jeopardize his job status.  Patient left message for [employer] he wanted to discuss week off from work and talk with me.  Discussed with patient he can contact me directly and does not need to go through HR.  Discussed he can email [email or web address] or call [phone].  Discussed I only notify HR if positive home covid test/quarantine, or hospital admitted but not reason/don't expect patient to be at work.  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  Patient would like GI referral for his GERD symptoms.  New this week symptoms prior to last week pantoprazole working well for patient.  Discussed with patient urgent GI referrals typically GI bleed, gallbladder/stones, appendicitis.  Discussed with patient I am unsure how long current wait is for routine GI appts but could ask tomorrow when office open.  [facility] [employer] are network for [employer].  I can place referral for patient if he desires tomorrow/Dr Tobori not available.  Ginger or chamomile tea are used as home remedies for stomach upset also.  Discussed if burping/gassy can use OTC simethicone also.  Patient verbalized understanding information/instructions, agreed with plan of care and had no further questions at this time.  Patient A&Ox3 spoke full sentences without difficulty duration of  call 37 minutes. ?

## 2022-01-26 ENCOUNTER — Other Ambulatory Visit: Payer: Self-pay

## 2022-01-26 ENCOUNTER — Encounter: Payer: Self-pay | Admitting: Registered Nurse

## 2022-01-26 ENCOUNTER — Ambulatory Visit: Payer: No Typology Code available for payment source | Admitting: Registered Nurse

## 2022-01-26 VITALS — BP 125/86 | HR 90 | Temp 98.1°F | Resp 18 | Ht 70.0 in | Wt 146.4 lb

## 2022-01-26 DIAGNOSIS — F4321 Adjustment disorder with depressed mood: Secondary | ICD-10-CM

## 2022-01-26 DIAGNOSIS — K219 Gastro-esophageal reflux disease without esophagitis: Secondary | ICD-10-CM | POA: Diagnosis not present

## 2022-01-26 MED ORDER — SUCRALFATE 1 G PO TABS: 1.0000 g | ORAL_TABLET | Freq: Three times a day (TID) | ORAL | 0 refills | Status: AC

## 2022-01-26 MED ORDER — MIRTAZAPINE 7.5 MG PO TABS: 7.5000 mg | ORAL_TABLET | Freq: Every day | ORAL | 0 refills | Status: DC

## 2022-01-26 NOTE — Telephone Encounter (Signed)
Per chart review patient appt with Dr Kateri Plummer today ?

## 2022-01-26 NOTE — Telephone Encounter (Signed)
Dr Orland Mustard recommended: ?Sucralfate 4x daily - before meals and bed. ?  ?Start mitrazapine nightly. ?  ?Med check with PCP in 4-6 weeks or at physical ? ?Off this week for grief ?

## 2022-01-26 NOTE — Patient Instructions (Addendum)
Frederick Abbott -  ? ?Great to see you ? ?Sucralfate 4x daily - before meals and bed. ? ?Start mitrazapine nightly. ? ?Med check with PCP in 4-6 weeks or at physical ? ?Thank you, ? ?[name]  ? ? ? ?If you have lab work done today you will be contacted with your lab results within the next 2 weeks.  If you have not heard from [place] then please contact us. The fastest way to get your results is to register for My Chart. ? ? ?IF you received an x-ray today, you will receive an invoice from [facility]. Please contact [facility] at [phone] with questions or concerns regarding your invoice.  ? ?IF you received labwork today, you will receive an invoice from [place]. Please contact [facility] at [phone] with questions or concerns regarding your invoice.  ? ?Our billing staff will not be able to assist you with questions regarding bills from these companies. ? ?You will be contacted with the lab results as soon as they are available. The fastest way to get your results is to activate your My Chart account. Instructions are located on the last page of this paperwork. If you have not heard from [place] regarding the results in 2 weeks, please contact this office. ?  ? ? ?

## 2022-01-26 NOTE — Progress Notes (Signed)
? ?Established Patient Office Visit ? ?Subjective:  ?Patient ID: Frederick Abbott, male    DOB: [birth year]  Age: 51 y.o. MRN: [id number] ? ?CC:  ?Chief Complaint  ?Patient presents with  ? Gastroesophageal Reflux  ?  Patient states last week he had a bad flare up with his GER. Patient states he threw up for 30 hours and didn't eat for at least 48 hours. Also had a panic attack.  ? ? ?HPI ?Frederick Abbott presents for GERD ? ?Bad flair last week. Vomiting x 30 hours, couldn't eat for 48 hours. Started Monday morning.  ?Vomited every 30 minutes or so.  ? ?Generally takes pantoprazole 17m po qd for control, but concerned this isn't as effective. ? ?No melena, brbpr ?No sudden weight changes/unexpected weight changes. ?No diarrhea.  ? ?Did take last week off due to acute illness. ?This week off for grieving as he lost a friend last weekend.  ? ?Past Medical History:  ?Diagnosis Date  ? Anxiety   ? Belching   ? Cholecystitis chronic   ? Depression   ? Diarrhea   ? "comes and goes" (12/14/2017)  ? GERD (gastroesophageal reflux disease)   ? Hepatitis A   ? Hepatitis B   ? HIV [place]human immunodeficiency virus infection) ([place] 12[place]18[place]1995  ? Hypercholesterolemia   ? Neuromuscular disorder ([place]   ? neuropathy  ? Neuropathy   ? Pneumonia   ? childhood  ? Seasonal allergies   ? Seizures ([place]   ? "as a child, none since age 51 sister had them too" (12/14/2017)  ? Type II diabetes mellitus ([place]   ? ? ?Past Surgical History:  ?Procedure Laterality Date  ? APPENDECTOMY  12/14/2017  ? CHOLECYSTECTOMY  05/29/2012  ? Procedure: LAPAROSCOPIC CHOLECYSTECTOMY WITH INTRAOPERATIVE CHOLANGIOGRAM;  Surgeon: MImogene Burn TGeorgette Dover MD;  Location: [facility];  Service: General;  Laterality: N/A;  ? JOINT REPLACEMENT    ? LAPAROSCOPIC APPENDECTOMY N/A 12/14/2017  ? Procedure: APPENDECTOMY LAPAROSCOPIC;  Surgeon: WIleana Roup MD;  [place] [facility]  Service: General;  Laterality: N/A;  ? TONSILLECTOMY  1973  ? TOTAL HIP ARTHROPLASTY Bilateral  01/2005-08/2005  ? "left-right"  ? ? ?Family History  ?Problem Relation Age of Onset  ? Cancer Mother   ?     breast  ? Cancer Maternal Grandmother   ?     melanoma-nose  ? Cancer Paternal Grandfather   ?     melanoma-ear  ? Colon cancer Neg Hx   ? Esophageal cancer Neg Hx   ? Rectal cancer Neg Hx   ? Stomach cancer Neg Hx   ? ? ?Social History  ? ?Socioeconomic History  ? Marital status: Married  ?  Spouse name: Not on file  ? Number of children: Not on file  ? Years of education: Not on file  ? Highest education level: Not on file  ?Occupational History  ? Not on file  ?Tobacco Use  ? Smoking status: Former  ?  Packs/day: 0.80  ?  Years: 10.00  ?  Pack years: 8.00  ?  Types: Cigarettes  ?  Quit date: 05/06/2006  ?  Years since quitting: 15.7  ? Smokeless tobacco: Never  ?Vaping Use  ? Vaping Use: Never used  ?Substance and Sexual Activity  ? Alcohol use: Yes  ?  Alcohol/week: 2.0 standard drinks  ?  Types: 2 Cans of beer per week  ? Drug use: Not Currently  ?  Types: Marijuana  ?  Comment:  12/14/2017 'last used 1 wk ago"  ? Sexual activity: Yes  ?  Partners: Male  ?  Comment: declined condoms 03/03/21  ?Other Topics Concern  ? Not on file  ?Social History Narrative  ? Not on file  ? ?Social Determinants of Health  ? ?Financial Resource Strain: Not on file  ?Food Insecurity: Not on file  ?Transportation Needs: Not on file  ?Physical Activity: Not on file  ?Stress: Not on file  ?Social Connections: Not on file  ?Intimate Partner Violence: Not on file  ? ? ?Outpatient Medications Prior to Visit  ?Medication Sig Dispense Refill  ? albuterol (VENTOLIN HFA) 108 (90 Base) MCG/ACT inhaler Inhale 1-2 puffs into the lungs every 4 (four) hours as needed for wheezing or shortness of breath. 1 each 0  ? ALPRAZolam (XANAX) 0.5 MG tablet Take 1 tablet (0.5 mg total) by mouth 2 (two) times daily as needed for anxiety. 30 tablet 1  ? aspirin 81 MG tablet Take 81 mg by mouth daily.    ? atorvastatin (LIPITOR) 10 MG tablet TAKE 1 TABLET  BY MOUTH EVERY DAY AT NIGHT 90 tablet 1  ? BD PEN NEEDLE NANO U/F 32G X 4 MM MISC USE 1 NEEDLE TO INJECT 24 UNITS INTO THE SKIN DAILY. 100 each 3  ? bictegravir-emtricitabine-tenofovir AF (BIKTARVY) 50-200-25 MG TABS tablet Take 1 tablet by mouth daily. 30 tablet 11  ? buPROPion (WELLBUTRIN XL) 300 MG 24 hr tablet TAKE 1 TABLET BY MOUTH EVERY DAY 90 tablet 0  ? Continuous Blood Gluc Sensor (FREESTYLE LIBRE 14 DAY SENSOR) MISC CHANGE EVERY 14 DAYS    ? fenofibrate 160 MG tablet TAKE 1 TABLET BY MOUTH EVERY DAY 90 tablet 1  ? Glucagon (GVOKE HYPOPEN 2-PACK) 1 MG/0.2ML SOAJ Inject 1 pen into the skin as needed.    ? Glucagon, rDNA, (GLUCAGON EMERGENCY) 1 MG KIT Inject 1 mg as directed as needed for up to 2 doses (administer when CBG is less than 55 and repeat x1 in 15 minutes if needed). 1 kit 2  ? glucose blood test strip 1 each by Other route 3 (three) times daily as needed. Use as directed.    ? ibuprofen (ADVIL,MOTRIN) 200 MG tablet Take 600 mg by mouth every 6 (six) hours as needed for moderate pain.    ? insulin glargine, 1 Unit Dial, (TOUJEO SOLOSTAR) 300 UNIT/ML Solostar Pen 12 units    ? insulin lispro (HUMALOG KWIKPEN) 200 UNIT/ML KwikPen 4 units+correctional    ? JARDIANCE 25 MG TABS tablet Take 25 mg by mouth daily.    ? lisinopril (ZESTRIL) 5 MG tablet TAKE 1 TABLET BY MOUTH EVERY DAY 90 tablet 1  ? loratadine (CLARITIN) 10 MG tablet Take 10 mg by mouth daily.    ? magnesium oxide (MAG-OX) 400 MG tablet Take 400 mg by mouth daily.    ? Menthol 5 % PTCH Apply 1 patch topically as needed (knee pain).    ? mineral oil-hydrophilic petrolatum (AQUAPHOR) ointment Apply topically as needed for dry skin. 420 g 0  ? Multiple Vitamin (MULTIVITAMIN WITH MINERALS) TABS tablet Take 1 tablet by mouth daily.    ? ondansetron (ZOFRAN-ODT) 4 MG disintegrating tablet Take 1 tablet (4 mg total) by mouth every 8 (eight) hours as needed for nausea or vomiting. 20 tablet 0  ? ONETOUCH DELICA LANCETS 02T MISC by Does not apply  route.    ? pantoprazole (PROTONIX) 40 MG tablet TAKE 1 TABLET BY MOUTH EVERY DAY 90 tablet 1  ?  Probiotic Product (PROBIOTIC DAILY PO) Take by mouth.    ? RYBELSUS 14 MG TABS     ? tadalafil (CIALIS) 20 MG tablet TAKE 1/2 TO 1 TABLET BY MOUTH AS NEEDED 6 tablet 8  ? sodium chloride (OCEAN) 0.65 % SOLN nasal spray Place 2 sprays into both nostrils every 2 (two) hours while awake.  0  ? TOUJEO SOLOSTAR 300 UNIT/ML SOPN 6 Units by Subdermal route daily.    ? ?No facility-administered medications prior to visit.  ? ? ?Allergies  ?Allergen Reactions  ? Phenytoin Other (See Comments)  ?  Childhood pt unsure ?Other reaction(s): Unknown  ? Metformin And Related Nausea And Vomiting  ? Metformin Hcl Other (See Comments)  ?  Other reaction(s): Upset Stomach  ? Other Other (See Comments)  ? ? ?ROS ?Review of Systems  ?Constitutional:  Positive for appetite change.  ?HENT: Negative.    ?Eyes: Negative.   ?Respiratory: Negative.    ?Cardiovascular: Negative.   ?Gastrointestinal:  Positive for nausea and vomiting.  ?Endocrine: Negative.   ?Genitourinary: Negative.   ?Musculoskeletal: Negative.   ?Skin: Negative.   ?Neurological: Negative.   ?Psychiatric/Behavioral: Negative.    ?All other systems reviewed and are negative. ? ?  ?Objective:  ?  ?Physical Exam ?Constitutional:   ?   General: He is not in acute distress. ?   Appearance: Normal appearance. He is normal weight. He is not ill-appearing, toxic-appearing or diaphoretic.  ?Cardiovascular:  ?   Rate and Rhythm: Normal rate and regular rhythm.  ?   Heart sounds: Normal heart sounds. No murmur heard. ?  No friction rub. No gallop.  ?Pulmonary:  ?   Effort: Pulmonary effort is normal. No respiratory distress.  ?   Breath sounds: Normal breath sounds. No stridor. No wheezing, rhonchi or rales.  ?Chest:  ?   Chest wall: No tenderness.  ?Neurological:  ?   General: No focal deficit present.  ?   Mental Status: He is alert and oriented to person, place, and time. Mental status is  at baseline.  ?Psychiatric:     ?   Mood and Affect: Mood normal.     ?   Behavior: Behavior normal.     ?   Thought Content: Thought content normal.     ?   Judgment: Judgment normal.  ? ? ?BP 125/86

## 2022-01-27 ENCOUNTER — Encounter: Payer: Self-pay | Admitting: Registered Nurse

## 2022-01-27 NOTE — Telephone Encounter (Signed)
Ok to write, ? ?Thanks, ? ?Rich

## 2022-01-27 NOTE — Telephone Encounter (Signed)
Patient contacted via telephone and he stated started mitrazapine last night slept well and didn't get up until 1330 today.  Doesn't have work note will contact Dr Darien Ramus office to get one for this week.  Feeling less anxiety/pressure today.  Ate boost drink, soup, strawberries yesterday and snack candy.  Got up late today but trying to eat small amount of something hourly today e.g. mashed potatoes, soup, banana.  Weakness improving.  Went grocery shopping/pharmacy after appt yesterday and was wiped out yesterday evening.  Took sucralfate yesterday also.  Has not had indigestion/reflux or belching today.   "I felt like I finally got some REM sleep last night" Patient A&Ox3 spoke full sentences without difficulty; voice with more energy today; no cough/congestion/throat clearing audible during 4 minute telephone call.  Patient stated would contact Dr Darien Ramus office after this call terminated for work note.  Patient verbalized understanding information/instructions, agreed with plan of care and had no further questions at this time. ?

## 2022-01-31 NOTE — Telephone Encounter (Signed)
Patient reported has been feeling better the past 48 hours and advancing diet.  Has return to work note from Dr Kateri Plummer for tomorrow.  Copy sent to HR team.  Patient A&Ox3 spoke full sentences without difficulty.  No audible congestion/throat clearing/cough.  Patient reminded RN Rolly Salter in clinic tomorrow if he needs anything.  Patient verbalized understanding information/instructions, agreed with plan of care and had no further questions at this time. ?

## 2022-02-01 ENCOUNTER — Ambulatory Visit: Payer: No Typology Code available for payment source | Admitting: Family Medicine

## 2022-02-01 NOTE — Telephone Encounter (Signed)
Pt in to clinic requesting printing of pcp work excuse letter. Printed and pt will take down to HR after leaving clinic. Pt did RTW today as expected. Feeling well overall and on new meds. Denies any new concerns today.  ?

## 2022-02-03 NOTE — Telephone Encounter (Signed)
Noted reviewed RN Hildred Alamin note returned to work as expected Monday 02/01/22.  Patient stopped me in workcenter 02/02/22 feeling well, appetite good.  Tolerating mirtazapine without difficulty and feels it is helping him.  Encouraged patient to hydrate when at work.  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  Patient denied questions or concerns and thanks me for assistance last week.  Encounter closed. ?

## 2022-02-09 ENCOUNTER — Other Ambulatory Visit: Payer: No Typology Code available for payment source

## 2022-02-09 ENCOUNTER — Other Ambulatory Visit: Payer: Self-pay

## 2022-02-09 DIAGNOSIS — B20 Human immunodeficiency virus [HIV] disease: Secondary | ICD-10-CM

## 2022-02-10 LAB — T-HELPER CELL (CD4) - (RCID CLINIC ONLY)
CD4 % Helper T Cell: 39 % (ref 33–65)
CD4 T Cell Abs: 842 /uL (ref 400–1790)

## 2022-02-11 LAB — COMPREHENSIVE METABOLIC PANEL
AG Ratio: 2.1 (calc) (ref 1.0–2.5)
ALT: 34 U/L (ref 9–46)
AST: 24 U/L (ref 10–35)
Albumin: 4.1 g/dL (ref 3.6–5.1)
Alkaline phosphatase (APISO): 66 U/L (ref 35–144)
BUN: 16 mg/dL (ref 7–25)
CO2: 24 mmol/L (ref 20–32)
Calcium: 9 mg/dL (ref 8.6–10.3)
Chloride: 109 mmol/L (ref 98–110)
Creat: 1.06 mg/dL (ref 0.70–1.30)
Globulin: 2 g/dL (calc) (ref 1.9–3.7)
Glucose, Bld: 184 mg/dL — ABNORMAL HIGH (ref 65–99)
Potassium: 4.3 mmol/L (ref 3.5–5.3)
Sodium: 141 mmol/L (ref 135–146)
Total Bilirubin: 0.3 mg/dL (ref 0.2–1.2)
Total Protein: 6.1 g/dL (ref 6.1–8.1)

## 2022-02-11 LAB — RPR: RPR Ser Ql: NONREACTIVE

## 2022-02-11 LAB — LIPID PANEL
Cholesterol: 155 mg/dL (ref <200)
HDL: 41 mg/dL (ref >=40)
LDL Cholesterol (Calc): 79 mg/dL (calc)
Non-HDL Cholesterol (Calc): 114 mg/dL (calc) (ref <130)
Total CHOL/HDL Ratio: 3.8 (calc) (ref <5.0)
Triglycerides: 269 mg/dL — ABNORMAL HIGH (ref <150)

## 2022-02-11 LAB — CBC
HCT: 44.7 % (ref 38.5–50.0)
Hemoglobin: 14.9 g/dL (ref 13.2–17.1)
MCH: 30.3 pg (ref 27.0–33.0)
MCHC: 33.3 g/dL (ref 32.0–36.0)
MCV: 91 fL (ref 80.0–100.0)
MPV: 10.7 fL (ref 7.5–12.5)
Platelets: 280 10*3/uL (ref 140–400)
RBC: 4.91 10*6/uL (ref 4.20–5.80)
RDW: 13.5 % (ref 11.0–15.0)
WBC: 7.2 10*3/uL (ref 3.8–10.8)

## 2022-02-11 LAB — HIV-1 RNA QUANT-NO REFLEX-BLD
HIV 1 RNA Quant: NOT DETECTED Copies/mL
HIV-1 RNA Quant, Log: NOT DETECTED Log cps/mL

## 2022-02-19 ENCOUNTER — Other Ambulatory Visit: Payer: Self-pay | Admitting: Internal Medicine

## 2022-02-19 DIAGNOSIS — B20 Human immunodeficiency virus [HIV] disease: Secondary | ICD-10-CM

## 2022-02-19 NOTE — Telephone Encounter (Signed)
Follow up appt 4/25 ?

## 2022-02-23 ENCOUNTER — Encounter: Payer: PRIVATE HEALTH INSURANCE | Admitting: Internal Medicine

## 2022-03-02 ENCOUNTER — Encounter: Payer: Self-pay | Admitting: Internal Medicine

## 2022-03-02 ENCOUNTER — Ambulatory Visit: Payer: No Typology Code available for payment source | Admitting: Internal Medicine

## 2022-03-02 ENCOUNTER — Other Ambulatory Visit: Payer: Self-pay

## 2022-03-02 DIAGNOSIS — K219 Gastro-esophageal reflux disease without esophagitis: Secondary | ICD-10-CM

## 2022-03-02 DIAGNOSIS — B20 Human immunodeficiency virus [HIV] disease: Secondary | ICD-10-CM | POA: Diagnosis not present

## 2022-03-02 DIAGNOSIS — E1165 Type 2 diabetes mellitus with hyperglycemia: Secondary | ICD-10-CM | POA: Insufficient documentation

## 2022-03-02 DIAGNOSIS — Z794 Long term (current) use of insulin: Secondary | ICD-10-CM | POA: Insufficient documentation

## 2022-03-02 DIAGNOSIS — F418 Other specified anxiety disorders: Secondary | ICD-10-CM

## 2022-03-02 MED ORDER — BIKTARVY 50-200-25 MG PO TABS: 1.0000 | ORAL_TABLET | Freq: Every day | ORAL | 11 refills | Status: DC

## 2022-03-02 NOTE — Assessment & Plan Note (Signed)
His chronic anxiety has improved and his depression is getting better since starting on new therapy 1 month ago. ?

## 2022-03-02 NOTE — Assessment & Plan Note (Signed)
His infection has been under excellent, long-term control.  He will continue Biktarvy and follow-up after lab work in 1 year. 

## 2022-03-02 NOTE — Progress Notes (Signed)
? ?   ? ? ? ? ?Patient Active Problem List  ? Diagnosis Date Noted  ? DM II (diabetes mellitus, type II), controlled ([place]) 12/11/2009  ?  Priority: High  ? Pure hypertriglyceridemia 08/20/2008  ?  Priority: High  ? Human immunodeficiency virus (HIV) disease ([place]) 06/01/2007  ?  Priority: High  ? Hyperglycemia due to type 2 diabetes mellitus ([place]) 03/02/2022  ? Long term (current) use of insulin ([place]) 03/02/2022  ? Chronic nausea 12/06/2018  ? Rectal bleeding 12/27/2014  ? Elevated LFTs 12/16/2014  ? Eustachian tube dysfunction 08/09/2013  ? Routine general medical examination at a health care facility 02/07/2013  ? Dyshidrotic eczema 02/07/2013  ? DECREASED HEARING, BILATERAL 09/18/2009  ? LOW BACK PAIN, CHRONIC 10/17/2008  ? VENEREAL WART 08/20/2008  ? GERD 06/05/2007  ? Depression with anxiety 06/01/2007  ? ALLERGIC RHINITIS 06/01/2007  ? PSORIASIS NEC 06/01/2007  ? HEPATITIS B, HX OF 06/01/2007  ? ? ?Patient's Medications  ?New Prescriptions  ? No medications on file  ?Previous Medications  ? ALBUTEROL (VENTOLIN HFA) 108 (90 BASE) MCG/ACT INHALER    Inhale 1-2 puffs into the lungs every 4 (four) hours as needed for wheezing or shortness of breath.  ? ALPRAZOLAM (XANAX) 0.5 MG TABLET    Take 1 tablet (0.5 mg total) by mouth 2 (two) times daily as needed for anxiety.  ? ASPIRIN 81 MG TABLET    Take 81 mg by mouth daily.  ? ATORVASTATIN (LIPITOR) 10 MG TABLET    TAKE 1 TABLET BY MOUTH EVERY DAY AT NIGHT  ? BD PEN NEEDLE NANO U/F 32G X 4 MM MISC    USE 1 NEEDLE TO INJECT 24 UNITS INTO THE SKIN DAILY.  ? BUPROPION (WELLBUTRIN XL) 300 MG 24 HR TABLET    TAKE 1 TABLET BY MOUTH EVERY DAY  ? CONTINUOUS BLOOD GLUC SENSOR (FREESTYLE LIBRE 14 DAY SENSOR) MISC    CHANGE EVERY 14 DAYS  ? FENOFIBRATE 160 MG TABLET    TAKE 1 TABLET BY MOUTH EVERY DAY  ? GLUCAGON (GVOKE HYPOPEN 2-PACK) 1 MG/0.2ML SOAJ    Inject 1 pen into the skin as needed.  ? GLUCAGON, RDNA, (GLUCAGON EMERGENCY) 1 MG KIT    Inject 1 mg as directed as needed  for up to 2 doses (administer when CBG is less than 55 and repeat x1 in 15 minutes if needed).  ? GLUCOSE BLOOD TEST STRIP    1 each by Other route 3 (three) times daily as needed. Use as directed.  ? IBUPROFEN (ADVIL,MOTRIN) 200 MG TABLET    Take 600 mg by mouth every 6 (six) hours as needed for moderate pain.  ? INSULIN GLARGINE, 1 UNIT DIAL, (TOUJEO SOLOSTAR) 300 UNIT/ML SOLOSTAR PEN    12 units  ? INSULIN LISPRO (HUMALOG KWIKPEN) 200 UNIT/ML KWIKPEN    4 units+correctional  ? JARDIANCE 25 MG TABS TABLET    Take 25 mg by mouth daily.  ? LISINOPRIL (ZESTRIL) 5 MG TABLET    TAKE 1 TABLET BY MOUTH EVERY DAY  ? LORATADINE (CLARITIN) 10 MG TABLET    Take 10 mg by mouth daily.  ? MAGNESIUM OXIDE (MAG-OX) 400 MG TABLET    Take 400 mg by mouth daily.  ? MENTHOL 5 % PTCH    Apply 1 patch topically as needed (knee pain).  ? MINERAL OIL-HYDROPHILIC PETROLATUM (AQUAPHOR) OINTMENT    Apply topically as needed for dry skin.  ? MIRTAZAPINE (REMERON) 7.5 MG TABLET    Take  1 tablet (7.5 mg total) by mouth at bedtime.  ? MULTIPLE VITAMIN (MULTIVITAMIN WITH MINERALS) TABS TABLET    Take 1 tablet by mouth daily.  ? ONDANSETRON (ZOFRAN-ODT) 4 MG DISINTEGRATING TABLET    Take 1 tablet (4 mg total) by mouth every 8 (eight) hours as needed for nausea or vomiting.  ? ONETOUCH DELICA LANCETS 32T MISC    by Does not apply route.  ? PANTOPRAZOLE (PROTONIX) 40 MG TABLET    TAKE 1 TABLET BY MOUTH EVERY DAY  ? PROBIOTIC PRODUCT (PROBIOTIC DAILY PO)    Take by mouth.  ? RYBELSUS 14 MG TABS      ? SODIUM CHLORIDE (OCEAN) 0.65 % SOLN NASAL SPRAY    Place 2 sprays into both nostrils every 2 (two) hours while awake.  ? SUCRALFATE (CARAFATE) 1 G TABLET    Take 1 tablet (1 g total) by mouth 4 (four) times daily -  with meals and at bedtime.  ? TADALAFIL (CIALIS) 20 MG TABLET    TAKE 1/2 TO 1 TABLET BY MOUTH AS NEEDED  ?Modified Medications  ? Modified Medication Previous Medication  ? BICTEGRAVIR-EMTRICITABINE-TENOFOVIR AF (BIKTARVY) 50-200-25 MG  TABS TABLET BIKTARVY 50-200-25 MG TABS tablet  ?    Take 1 tablet by mouth daily.    TAKE 1 TABLET BY MOUTH EVERY DAY  ?Discontinued Medications  ? No medications on file  ? ? ?Subjective: ?Frederick Abbott is in for his routine HIV follow-up visit.  He denies any problems obtaining, taking or tolerating his Biktarvy and does not recall missing any doses.  He has been feeling much better lately.  He is still taking Protonix but he is not having any current, active problems with acid reflux.  He has been able to eat without difficult symptoms and having regained the weight that he lost.  He has, however noted some increased fluctuation in his blood sugars.  He believes that his last hemoglobin A1c was around 7.2.  He was started on mirtazapine a little over 1 month ago and states that he has definitely noticed improvement in his chronic depression. ? ?Review of Systems: ?Review of Systems  ?Constitutional:  Negative for weight loss.  ?Gastrointestinal:  Negative for abdominal pain, heartburn, nausea and vomiting.  ?Psychiatric/Behavioral:  Positive for depression. The patient is not nervous/anxious.   ? ?Past Medical History:  ?Diagnosis Date  ? Anxiety   ? Belching   ? Cholecystitis chronic   ? Depression   ? Diarrhea   ? "comes and goes" (12/14/2017)  ? GERD (gastroesophageal reflux disease)   ? Hepatitis A   ? Hepatitis B   ? HIV (human immunodeficiency virus infection) ([place]) dx'd 12/18/1995  ? Hypercholesterolemia   ? Neuromuscular disorder ([place])   ? neuropathy  ? Neuropathy   ? Pneumonia   ? childhood  ? Seasonal allergies   ? Seizures ([place])   ? "as a child, none since age 21; sister had them too" (12/14/2017)  ? Type II diabetes mellitus ([place])   ? ? ?Social History  ? ?Tobacco Use  ? Smoking status: Former  ?  Packs/day: 0.80  ?  Years: 10.00  ?  Pack years: 8.00  ?  Types: Cigarettes  ?  Quit date: 05/06/2006  ?  Years since quitting: 15.8  ? Smokeless tobacco: Never  ?Vaping Use  ? Vaping Use: Never used  ?Substance Use Topics   ? Alcohol use: Yes  ?  Alcohol/week: 2.0 standard drinks  ?  Types: 2 Cans  of beer per week  ? Drug use: Not Currently  ?  Types: Marijuana  ?  Comment: 12/14/2017 'last used 1 wk ago"  ? ? ?Family History  ?Problem Relation Age of Onset  ? Cancer Mother   ?     breast  ? Cancer Maternal Grandmother   ?     melanoma-nose  ? Cancer Paternal Grandfather   ?     melanoma-ear  ? Colon cancer Neg Hx   ? Esophageal cancer Neg Hx   ? Rectal cancer Neg Hx   ? Stomach cancer Neg Hx   ? ? ?Allergies  ?Allergen Reactions  ? Phenytoin Other (See Comments)  ?  Childhood pt unsure ?Other reaction(s): Unknown  ? Metformin And Related Nausea And Vomiting  ? Metformin Hcl Other (See Comments)  ?  Other reaction(s): Upset Stomach  ? Other Other (See Comments)  ? ? ?Health Maintenance  ?Topic Date Due  ? COVID-19 Vaccine (4 - Booster for Janssen series) 05/22/2021  ? FOOT EXAM  12/22/2021  ? HEMOGLOBIN A1C  01/05/2022  ? INFLUENZA VACCINE  06/08/2022  ? OPHTHALMOLOGY EXAM  10/06/2022  ? TETANUS/TDAP  08/10/2023  ? COLONOSCOPY (Pts 45-80yr Insurance coverage will need to be confirmed)  10/26/2025  ? Hepatitis C Screening  Completed  ? HIV Screening  Completed  ? Zoster Vaccines- Shingrix  Completed  ? HPV VACCINES  Aged Out  ? ? ?Objective: ? ?Vitals:  ? 03/02/22 1615  ?BP: 136/79  ?Pulse: 62  ?Resp: 16  ?SpO2: 99%  ?Weight: 161 lb 12.8 oz (73.4 kg)  ?Height: _0  (1.778 m)  ? ?Body mass index is 23.22 kg/m?. ? ?Physical Exam ?Constitutional:   ?   Comments: He is in good spirits.  He has gained 15 pounds in the past month.  ?Cardiovascular:  ?   Rate and Rhythm: Normal rate and regular rhythm.  ?   Heart sounds: No murmur heard. ?Pulmonary:  ?   Effort: Pulmonary effort is normal.  ?   Breath sounds: Normal breath sounds.  ?Psychiatric:     ?   Mood and Affect: Mood normal.  ? ? ?Lab Results ?Lab Results  ?Component Value Date  ? WBC 7.2 02/09/2022  ? HGB 14.9 02/09/2022  ? HCT 44.7 02/09/2022  ? MCV 91.0 02/09/2022  ? PLT 280  02/09/2022  ?  ?Lab Results  ?Component Value Date  ? CREATININE 1.06 02/09/2022  ? BUN 16 02/09/2022  ? NA 141 02/09/2022  ? K 4.3 02/09/2022  ? CL 109 02/09/2022  ? CO2 24 02/09/2022  ?  ?Lab Results  ?Compon

## 2022-03-02 NOTE — Assessment & Plan Note (Signed)
His acid reflux is in remission and he has been able to regain his lost weight. ?

## 2022-03-09 ENCOUNTER — Ambulatory Visit (INDEPENDENT_AMBULATORY_CARE_PROVIDER_SITE_OTHER): Payer: No Typology Code available for payment source | Admitting: Family Medicine

## 2022-03-09 ENCOUNTER — Encounter: Payer: Self-pay | Admitting: Family Medicine

## 2022-03-09 VITALS — BP 110/68 | HR 82 | Temp 98.0°F | Resp 16 | Ht 70.0 in | Wt 161.6 lb

## 2022-03-09 DIAGNOSIS — Z125 Encounter for screening for malignant neoplasm of prostate: Secondary | ICD-10-CM

## 2022-03-09 DIAGNOSIS — Z0000 Encounter for general adult medical examination without abnormal findings: Secondary | ICD-10-CM | POA: Diagnosis not present

## 2022-03-09 DIAGNOSIS — E119 Type 2 diabetes mellitus without complications: Secondary | ICD-10-CM

## 2022-03-09 LAB — CBC WITH DIFFERENTIAL/PLATELET
Basophils Absolute: 0 10*3/uL (ref 0.0–0.1)
Basophils Relative: 0.5 % (ref 0.0–3.0)
Eosinophils Absolute: 0.3 10*3/uL (ref 0.0–0.7)
Eosinophils Relative: 3.5 % (ref 0.0–5.0)
HCT: 46.2 % (ref 39.0–52.0)
Hemoglobin: 15.5 g/dL (ref 13.0–17.0)
Lymphocytes Relative: 33.2 % (ref 12.0–46.0)
Lymphs Abs: 2.6 10*3/uL (ref 0.7–4.0)
MCHC: 33.4 g/dL (ref 30.0–36.0)
MCV: 92.1 fl (ref 78.0–100.0)
Monocytes Absolute: 0.7 10*3/uL (ref 0.1–1.0)
Monocytes Relative: 8.7 % (ref 3.0–12.0)
Neutro Abs: 4.3 10*3/uL (ref 1.4–7.7)
Neutrophils Relative %: 54.1 % (ref 43.0–77.0)
Platelets: 244 10*3/uL (ref 150.0–400.0)
RBC: 5.02 Mil/uL (ref 4.22–5.81)
RDW: 13.6 % (ref 11.5–15.5)
WBC: 8 10*3/uL (ref 4.0–10.5)

## 2022-03-09 LAB — HEMOGLOBIN A1C: Hgb A1c MFr Bld: 7.1 % — ABNORMAL HIGH (ref 4.6–6.5)

## 2022-03-09 LAB — TSH: TSH: 2.54 u[IU]/mL (ref 0.35–5.50)

## 2022-03-09 LAB — PSA: PSA: 0.45 ng/mL (ref 0.10–4.00)

## 2022-03-09 NOTE — Patient Instructions (Addendum)
Follow up in 6 months to recheck BP and cholesterol ?We'll notify you of your lab results and make any changes if needed ?Keep up the good work on healthy diet and regular exercise ?Call with any questions or concerns ?Stay Safe!  Stay Healthy! ?Have a great summer!!! ?

## 2022-03-09 NOTE — Assessment & Plan Note (Signed)
Chronic problem.  Following w/ Endo regularly.  UTD on eye exam, foot exam.  On ACE for renal protection.  Check A1C and continue to follow along. ?

## 2022-03-09 NOTE — Assessment & Plan Note (Signed)
Pt's PE WNL.  UTD on colonoscopy, Tdap, PNA, flu, COVID.  Check labs.  Anticipatory guidance provided.  ?

## 2022-03-09 NOTE — Progress Notes (Signed)
? ?  Subjective:  ? ? Patient ID: Frederick Abbott, male    DOB: 06/27/1971, 51 y.o.   MRN: [id number] ? ?HPI ?CPE- UTD on Tdap, flu, PNA.  UTD on colonoscopy, eye exam.  UTD on foot exam w/ podiatry.  No concerns today. ? ?Patient Care Team  ?  Relationship Specialty Notifications Start End  ?Sheliah Hatch, MD PCP - General Family Medicine  08/31/12   ?Cliffton Asters, MD PCP - Infectious Diseases Infectious Diseases  10/21/10   ?Meryl Dare, MD Consulting Physician Gastroenterology  09/11/15   ?Marlene Lard, MD Consulting Physician Endocrinology  12/20/19   ?  ?Health Maintenance  ?Topic Date Due  ? COVID-19 Vaccine (4 - Booster for Janssen series) 05/22/2021  ? FOOT EXAM  12/22/2021  ? HEMOGLOBIN A1C  01/05/2022  ? INFLUENZA VACCINE  06/08/2022  ? OPHTHALMOLOGY EXAM  10/06/2022  ? TETANUS/TDAP  08/10/2023  ? COLONOSCOPY (Pts 45-23yrs Insurance coverage will need to be confirmed)  10/26/2025  ? Hepatitis C Screening  Completed  ? HIV Screening  Completed  ? Zoster Vaccines- Shingrix  Completed  ? HPV VACCINES  Aged Out  ?  ? ? ?Review of Systems ?Patient reports no vision/hearing changes, anorexia, fever ,adenopathy, persistant/recurrent hoarseness, swallowing issues, chest pain, palpitations, edema, persistant/recurrent cough, hemoptysis, dyspnea (rest,exertional, paroxysmal nocturnal), gastrointestinal  bleeding (melena, rectal bleeding), abdominal pain, excessive heart burn, GU symptoms (dysuria, hematuria, voiding/incontinence issues) syncope, focal weakness, memory loss, numbness & tingling, skin/hair/nail changes, depression, anxiety, abnormal bruising/bleeding, musculoskeletal symptoms/signs.  ?   ?Objective:  ? Physical Exam ?General Appearance:    Alert, cooperative, no distress, appears stated age  ?Head:    Normocephalic, without obvious abnormality, atraumatic  ?Eyes:    PERRL, conjunctiva/corneas clear, EOM's intact both eyes       ?Ears:    Normal TM's and external ear canals, both ears   ?Nose:   Nares normal, septum midline, mucosa normal, no drainage ?  or sinus tenderness  ?Throat:   Lips, mucosa, and tongue normal; teeth and gums normal  ?Neck:   Supple, symmetrical, trachea midline, no adenopathy;     ?  thyroid:  No enlargement/tenderness/nodules  ?Back:     Symmetric, no curvature, ROM normal, no CVA tenderness  ?Lungs:     Clear to auscultation bilaterally, respirations unlabored  ?Chest wall:    No tenderness or deformity  ?Heart:    Regular rate and rhythm, S1 and S2 normal, no murmur, rub ?  or gallop  ?Abdomen:     Soft, non-tender, bowel sounds active all four quadrants,  ?  no masses, no organomegaly  ?Genitalia:    deferred  ?Rectal:    ?Extremities:   Extremities normal, atraumatic, no cyanosis or edema  ?Pulses:   2+ and symmetric all extremities  ?Skin:   Skin color, texture, turgor normal, no rashes or lesions  ?Lymph nodes:   Cervical, supraclavicular, and axillary nodes normal  ?Neurologic:   CNII-XII intact. Normal strength, sensation and reflexes    ?  throughout  ?  ? ? ? ?   ?Assessment & Plan:  ? ? ?

## 2022-03-10 ENCOUNTER — Telehealth: Payer: Self-pay

## 2022-03-10 NOTE — Telephone Encounter (Signed)
Spoke w/ pt informed him of his lab results . Pt expressed verbal understanding  ?

## 2022-03-10 NOTE — Telephone Encounter (Signed)
-----   Message from Midge Minium, MD sent at 03/10/2022  7:32 AM EDT ----- ?Labs look great!  No changes at this time ?

## 2022-03-10 NOTE — Telephone Encounter (Signed)
-----   Message from Katherine E Tabori, MD sent at 03/10/2022  7:32 AM EDT ----- ?Labs look great!  No changes at this time ?

## 2022-03-10 NOTE — Telephone Encounter (Signed)
Spoke w/ pt and informed him of his lab results . Pt expressed verbal understanding . ?

## 2022-04-03 ENCOUNTER — Other Ambulatory Visit: Payer: Self-pay | Admitting: Family Medicine

## 2022-04-06 NOTE — Telephone Encounter (Signed)
Patient is requesting a refill of the following medications: Requested Prescriptions   Pending Prescriptions Disp Refills   buPROPion (WELLBUTRIN XL) 300 MG 24 hr tablet [Pharmacy Med Name: BUPROPION HCL XL 300 MG TABLET] 90 tablet 0    Sig: TAKE 1 TABLET BY MOUTH EVERY DAY    Date of patient request: 04/06/2022 Last office visit: 03/09/22 Date of last refill: 12/31/21 Last refill amount: 90

## 2022-04-08 ENCOUNTER — Other Ambulatory Visit: Payer: Self-pay | Admitting: Registered Nurse

## 2022-04-08 DIAGNOSIS — F4321 Adjustment disorder with depressed mood: Secondary | ICD-10-CM

## 2022-04-09 ENCOUNTER — Other Ambulatory Visit: Payer: Self-pay | Admitting: Family Medicine

## 2022-04-26 ENCOUNTER — Other Ambulatory Visit: Payer: Self-pay | Admitting: *Deleted

## 2022-04-26 MED ORDER — LISINOPRIL 5 MG PO TABS: 5.0000 mg | ORAL_TABLET | Freq: Every day | ORAL | 1 refills | Status: DC

## 2022-04-26 MED ORDER — ATORVASTATIN CALCIUM 10 MG PO TABS: ORAL_TABLET | ORAL | 1 refills | Status: DC

## 2022-04-26 NOTE — Telephone Encounter (Signed)
Pt refills Lisinopril 5mg  and Atorvastatin 10mg  thru [facility] as no cost to him. We do however need an updated Rx each year to continue filling. His current Rx's are out of date. I've pended these. They may be entered as "No Print" so they won't go to his commercial pharmacy. He was last seen in your office 03/09/22. Please respond with any questions or concerns. You can also reach me at [phone] ext [phone].

## 2022-05-03 ENCOUNTER — Other Ambulatory Visit: Payer: Self-pay | Admitting: Family Medicine

## 2022-05-24 ENCOUNTER — Encounter: Payer: Self-pay | Admitting: Family Medicine

## 2022-05-30 ENCOUNTER — Other Ambulatory Visit: Payer: Self-pay | Admitting: Family Medicine

## 2022-06-24 ENCOUNTER — Other Ambulatory Visit: Payer: Self-pay | Admitting: Family Medicine

## 2022-06-29 ENCOUNTER — Encounter: Payer: Self-pay | Admitting: Family Medicine

## 2022-07-08 ENCOUNTER — Other Ambulatory Visit: Payer: Self-pay | Admitting: Family Medicine

## 2022-08-10 ENCOUNTER — Ambulatory Visit: Payer: No Typology Code available for payment source

## 2022-08-10 DIAGNOSIS — Z23 Encounter for immunization: Secondary | ICD-10-CM

## 2022-09-05 ENCOUNTER — Other Ambulatory Visit: Payer: Self-pay | Admitting: Family Medicine

## 2022-09-10 ENCOUNTER — Ambulatory Visit: Payer: No Typology Code available for payment source | Admitting: Family Medicine

## 2022-09-10 ENCOUNTER — Encounter: Payer: Self-pay | Admitting: Family Medicine

## 2022-09-10 VITALS — BP 122/82 | HR 84 | Temp 97.6°F | Resp 17 | Ht 70.0 in | Wt 160.5 lb

## 2022-09-10 DIAGNOSIS — M25511 Pain in right shoulder: Secondary | ICD-10-CM

## 2022-09-10 DIAGNOSIS — I10 Essential (primary) hypertension: Secondary | ICD-10-CM | POA: Diagnosis not present

## 2022-09-10 DIAGNOSIS — E119 Type 2 diabetes mellitus without complications: Secondary | ICD-10-CM | POA: Diagnosis not present

## 2022-09-10 DIAGNOSIS — J3089 Other allergic rhinitis: Secondary | ICD-10-CM

## 2022-09-10 DIAGNOSIS — E781 Pure hyperglyceridemia: Secondary | ICD-10-CM | POA: Diagnosis not present

## 2022-09-10 DIAGNOSIS — N489 Disorder of penis, unspecified: Secondary | ICD-10-CM

## 2022-09-10 LAB — BASIC METABOLIC PANEL
BUN: 18 mg/dL (ref 6–23)
CO2: 26 mEq/L (ref 19–32)
Calcium: 9.6 mg/dL (ref 8.4–10.5)
Chloride: 104 mEq/L (ref 96–112)
Creatinine, Ser: 1.07 mg/dL (ref 0.40–1.50)
GFR: 79.74 mL/min (ref >60.00)
Glucose, Bld: 123 mg/dL — ABNORMAL HIGH (ref 70–99)
Potassium: 3.8 mEq/L (ref 3.5–5.1)
Sodium: 138 mEq/L (ref 135–145)

## 2022-09-10 LAB — MICROALBUMIN / CREATININE URINE RATIO
Creatinine,U: 60.3 mg/dL
Microalb Creat Ratio: 1.2 mg/g (ref 0.0–30.0)
Microalb, Ur: <0.7 mg/dL (ref 0.0–1.9)

## 2022-09-10 LAB — CBC WITH DIFFERENTIAL/PLATELET
Basophils Absolute: 0 10*3/uL (ref 0.0–0.1)
Basophils Relative: 0.6 % (ref 0.0–3.0)
Eosinophils Absolute: 0.2 10*3/uL (ref 0.0–0.7)
Eosinophils Relative: 3.3 % (ref 0.0–5.0)
HCT: 49.2 % (ref 39.0–52.0)
Hemoglobin: 16.5 g/dL (ref 13.0–17.0)
Lymphocytes Relative: 38.6 % (ref 12.0–46.0)
Lymphs Abs: 2.4 10*3/uL (ref 0.7–4.0)
MCHC: 33.6 g/dL (ref 30.0–36.0)
MCV: 91 fl (ref 78.0–100.0)
Monocytes Absolute: 0.6 10*3/uL (ref 0.1–1.0)
Monocytes Relative: 10 % (ref 3.0–12.0)
Neutro Abs: 3 10*3/uL (ref 1.4–7.7)
Neutrophils Relative %: 47.5 % (ref 43.0–77.0)
Platelets: 245 10*3/uL (ref 150.0–400.0)
RBC: 5.41 Mil/uL (ref 4.22–5.81)
RDW: 13.5 % (ref 11.5–15.5)
WBC: 6.2 10*3/uL (ref 4.0–10.5)

## 2022-09-10 LAB — LIPID PANEL
Cholesterol: 149 mg/dL (ref 0–200)
HDL: 46.1 mg/dL (ref >39.00)
LDL Cholesterol: 81 mg/dL (ref 0–99)
NonHDL: 102.92
Total CHOL/HDL Ratio: 3
Triglycerides: 112 mg/dL (ref 0.0–149.0)
VLDL: 22.4 mg/dL (ref 0.0–40.0)

## 2022-09-10 LAB — HEPATIC FUNCTION PANEL
ALT: 39 U/L (ref 0–53)
AST: 29 U/L (ref 0–37)
Albumin: 4.7 g/dL (ref 3.5–5.2)
Alkaline Phosphatase: 60 U/L (ref 39–117)
Bilirubin, Direct: 0.1 mg/dL (ref 0.0–0.3)
Total Bilirubin: 0.5 mg/dL (ref 0.2–1.2)
Total Protein: 7 g/dL (ref 6.0–8.3)

## 2022-09-10 LAB — TSH: TSH: 2.31 u[IU]/mL (ref 0.35–5.50)

## 2022-09-10 LAB — HEMOGLOBIN A1C: Hgb A1c MFr Bld: 7.2 % — ABNORMAL HIGH (ref 4.6–6.5)

## 2022-09-10 MED ORDER — MONTELUKAST SODIUM 10 MG PO TABS: 10.0000 mg | ORAL_TABLET | Freq: Every day | ORAL | 3 refills | Status: DC

## 2022-09-10 NOTE — Patient Instructions (Addendum)
Schedule your complete physical in 6 months We'll notify you of your lab results and make any changes if needed We'll call you with your Ortho appt START the Montelukast (Singulair) nightly to help w/ congestion We'll call you with your Urology appt Keep up the good work!  You look great! Call with any questions or concerns Stay Safe! Stay Healthy! Happy Fall!!!

## 2022-09-10 NOTE — Progress Notes (Signed)
   Subjective:    Patient ID: Frederick Abbott, male    DOB: 1970/12/28, 51 y.o.   MRN: [id number]  HPI Hyperlipidemia- chronic problem, on Lipitor 10mg  nightly and Fenofibrate 160mg  daily.  No abd pain, N/V  HTN- chronic problem, on Lisinopril 5mg  daily w/ good control.  No CP, SOB, HAs, visual changes, edema.  DM- chronic problem, following w/ Endo.  Due for urine micro and A1C.  UTD on eye exam and foot exam.  R shoulder pain- pt having difficulty reaching into the back seat due to pain.  Finds that certain 'random' positions will cause pain.  No known injury.  R handed.  No pain w/ overhead motion.  Pain is brief.  Self limited.    Allergies- pt reports feeling 'stopped up' for the last year.  Taking daily antihistamine w/o relief.    Penile concerns- pt feels that penis is curved, feels like it's sprained, and at times feels like it's retracting.     Review of Systems For ROS see HPI     Objective:   Physical Exam Vitals reviewed.  Constitutional:      General: He is not in acute distress.    Appearance: Normal appearance. He is well-developed.  HENT:     Head: Normocephalic and atraumatic.  Eyes:     Extraocular Movements: Extraocular movements intact.     Conjunctiva/sclera: Conjunctivae normal.     Pupils: Pupils are equal, round, and reactive to light.  Neck:     Thyroid: No thyromegaly.  Cardiovascular:     Rate and Rhythm: Normal rate and regular rhythm.     Pulses: Normal pulses.     Heart sounds: Normal heart sounds. No murmur heard. Pulmonary:     Effort: Pulmonary effort is normal. No respiratory distress.     Breath sounds: Normal breath sounds.  Abdominal:     General: Bowel sounds are normal. There is no distension.     Palpations: Abdomen is soft.  Genitourinary:    Comments: Deferred at pt's request Musculoskeletal:     Cervical back: Normal range of motion and neck supple.     Right lower leg: No edema.     Left lower leg: No edema.      Comments: Limited internal rotation of R shoulder Full forward flexion and abduction  Lymphadenopathy:     Cervical: No cervical adenopathy.  Skin:    General: Skin is warm and dry.  Neurological:     General: No focal deficit present.     Mental Status: He is alert and oriented to person, place, and time.     Cranial Nerves: No cranial nerve deficit.  Psychiatric:        Mood and Affect: Mood normal.        Behavior: Behavior normal.           Assessment & Plan:   R shoulder pain- new.  Acute.  No known injury.  Pain occurs w/ certain movements and is brief, self limited.  Will refer to Ortho for complete evaluation.  Pt expressed understanding and is in agreement w/ plan.   Penile disorder- new.  Pt reports penis feels 'sprained' and that it will curve or 'retract'.  Will refer to urology for complete evaluation.

## 2022-09-13 ENCOUNTER — Encounter: Payer: Self-pay | Admitting: Family Medicine

## 2022-09-13 NOTE — Progress Notes (Signed)
Informed pt of lab results  

## 2022-09-15 ENCOUNTER — Encounter: Payer: Self-pay | Admitting: Family Medicine

## 2022-09-16 NOTE — Telephone Encounter (Signed)
I'm awaiting the office notes to be closed so I can route the referrals to the specialist office.

## 2022-09-20 NOTE — Assessment & Plan Note (Signed)
Chronic problem.  Following w/ Endo.  Due for microalbumin and A1C- ordered.

## 2022-09-20 NOTE — Assessment & Plan Note (Signed)
Deteriorated.  No relief w/ daily antihistamine.  Will add Singulair daily.  Pt expressed understanding and is in agreement w/ plan.

## 2022-09-20 NOTE — Assessment & Plan Note (Signed)
Chronic problem, on Lipitor 10mg  nightly and Fenofibrate 160mg  daily w/o difficulty.  Check labs.  Adjust meds prn

## 2022-09-20 NOTE — Assessment & Plan Note (Signed)
Chronic problem.  Adequate control on Lisinopril 5mg  daily.  Currently asymptomatic.  Check labs due to ACE but no anticipated med changes.  Will follow.

## 2022-10-13 ENCOUNTER — Encounter: Payer: Self-pay | Admitting: Family Medicine

## 2022-10-14 LAB — HM DIABETES EYE EXAM

## 2022-10-15 ENCOUNTER — Encounter: Payer: Self-pay | Admitting: Family Medicine

## 2022-10-19 ENCOUNTER — Other Ambulatory Visit: Payer: Self-pay | Admitting: Family Medicine

## 2022-10-20 ENCOUNTER — Other Ambulatory Visit: Payer: Self-pay | Admitting: Family Medicine

## 2022-11-04 ENCOUNTER — Encounter: Payer: Self-pay | Admitting: Family Medicine

## 2022-11-05 MED ORDER — FREESTYLE LIBRE 14 DAY SENSOR MISC: 3 refills | Status: DC

## 2022-11-10 ENCOUNTER — Encounter: Payer: Self-pay | Admitting: Family Medicine

## 2022-11-10 ENCOUNTER — Telehealth: Payer: Self-pay

## 2022-11-10 NOTE — Telephone Encounter (Signed)
Pt is scheduled and is aware of the appt.

## 2022-11-10 NOTE — Telephone Encounter (Signed)
Pt sent a my chart message cussing because he has not heard anything from about his Urology referral that was placed on 09/10/22. I have explained to him that before now that it depends on his insurance as well as the availability the office has as to when the apt can get made but the referral has been sent . He is demanding an apt and answers today . Can you check to see when he can get in to Urology ?

## 2022-11-12 ENCOUNTER — Encounter: Payer: Self-pay | Admitting: Family Medicine

## 2022-11-15 ENCOUNTER — Other Ambulatory Visit: Payer: Self-pay

## 2022-11-17 ENCOUNTER — Encounter: Payer: Self-pay | Admitting: Family Medicine

## 2022-11-20 ENCOUNTER — Other Ambulatory Visit: Payer: Self-pay | Admitting: Family Medicine

## 2022-12-06 ENCOUNTER — Other Ambulatory Visit: Payer: Self-pay | Admitting: Family Medicine

## 2022-12-16 ENCOUNTER — Telehealth: Payer: Self-pay | Admitting: Registered Nurse

## 2022-12-16 ENCOUNTER — Encounter: Payer: Self-pay | Admitting: Registered Nurse

## 2022-12-16 ENCOUNTER — Ambulatory Visit: Payer: No Typology Code available for payment source | Admitting: Occupational Medicine

## 2022-12-16 VITALS — BP 170/96 | HR 65

## 2022-12-16 DIAGNOSIS — R030 Elevated blood-pressure reading, without diagnosis of hypertension: Secondary | ICD-10-CM

## 2022-12-16 DIAGNOSIS — Z6379 Other stressful life events affecting family and household: Secondary | ICD-10-CM

## 2022-12-16 DIAGNOSIS — E1165 Type 2 diabetes mellitus with hyperglycemia: Secondary | ICD-10-CM

## 2022-12-16 DIAGNOSIS — F419 Anxiety disorder, unspecified: Secondary | ICD-10-CM

## 2022-12-16 NOTE — Progress Notes (Signed)
Patient reports wants to check BP, say left shoulder hurting into left chest. Denies nausea sweating or chest tightness. Mild Shob but anxious and tearful. Due to family child hurting self last night. Educated patient on signs for heart trouble and when to get help. Educated need to see to therapist , counselor, or psych Md. Holding and hiding feelings make anxious worse. Patient given contact information for [facility] and tele doc.BS 120 by phone monitor. Patient states drank espresso this am. BP not normal high but does go up with anxiety. Will follow up next week. Also asked him to come in next week to check BP again. ALso given [employer] for shoulder.

## 2022-12-17 NOTE — Telephone Encounter (Signed)
Notified by RN Evlyn Kanner patient reporting back/shoulder/chest pain pain/not feeling well and dealing with friend/family stressors.  Spoke with patient in clinic.  A&Ox3 respirations even and unlabored RA skin warm dry and pink spoke full sentences without difficulty.  Wearing continuous glucose monitor and blood sugar in typical range for patient.  Reviewed Epic last labs 09/10/22 CMET/lipids/CBC/TSH WNL glucose elevated stable Hgba1c 7.2.  [employer] handout on managing anxiety/chest pain sent to patient my chart.  Patient was given EAP contact information along with reminder teledoc available 24/7 from employer at no cost.  Patient stated he would consider scheduling appt with EAP [facility].  Discussed signs of AMI with patient left arm/jaw pain, sweating, dyspnea/shortness of breath without exertion, chest pain/tightness, nausea, dizziness.  Discussed symptoms of anxiety/panic attack with patient and that some may overlap with AMI.  Discussed avoid skipping meals/hydrate, budget 7-8 hours sleep per night. Caffeine/sleep deprivation/low/high blood sugar can worsen anxiety symptoms.  Caffeine worsens blood pressure (elevates) avoid caffeine this afternoon.   If new/worsening symptoms patient to go to UC/ER for re-evaluation as no EKG/troponin labs available at [place] to rule out AMI.  Discussed patient at increased risk due to age/diabetes/hypertension/smoking history/alcohol use.  He may also contact his PCM office for appt today.  Patient with history of left shoulder pain/injury.  "Feels like knot in his left upper back today with lifting"  Discussed this could be muscle spasms intermittent.  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  Denied family history of sudden death/AMI. Patient has support system of friends to discuss his concerns with.   Patient verbalized understanding information/instructions, agreed with plan of care and had no further questions at that time.  Patient did not  meet 2025 Be Well requirements.  Recheck blood pressure next week.  Hgba1c greater than 7 and BP greater than 135/85.  Patient to see RN Kimrey next week for BP recheck.  Hgba1c in 3 months.

## 2022-12-20 ENCOUNTER — Ambulatory Visit: Payer: No Typology Code available for payment source | Admitting: Occupational Medicine

## 2022-12-20 DIAGNOSIS — Z6379 Other stressful life events affecting family and household: Secondary | ICD-10-CM

## 2022-12-20 DIAGNOSIS — F419 Anxiety disorder, unspecified: Secondary | ICD-10-CM

## 2022-12-20 NOTE — Progress Notes (Signed)
Patient reports feeling better. No anxiety. Patient reports hasn't reach out to [facility] or tel doc will if needs too. Patient denies any chest tightness and say left shoulder doing ok today.

## 2022-12-21 ENCOUNTER — Ambulatory Visit: Payer: No Typology Code available for payment source | Admitting: Occupational Medicine

## 2022-12-21 VITALS — BP 140/82

## 2022-12-21 DIAGNOSIS — F419 Anxiety disorder, unspecified: Secondary | ICD-10-CM

## 2022-12-21 DIAGNOSIS — R030 Elevated blood-pressure reading, without diagnosis of hypertension: Secondary | ICD-10-CM

## 2022-12-21 DIAGNOSIS — Z6379 Other stressful life events affecting family and household: Secondary | ICD-10-CM

## 2022-12-21 NOTE — Progress Notes (Signed)
Will recheck next week better than last visit. Anxiety and stress improved using music too.

## 2023-01-18 NOTE — Telephone Encounter (Signed)
BP recheck was completed with RN Evlyn Kanner see note

## 2023-02-01 ENCOUNTER — Telehealth: Payer: Self-pay | Admitting: Registered Nurse

## 2023-02-01 ENCOUNTER — Encounter: Payer: Self-pay | Admitting: Registered Nurse

## 2023-02-01 DIAGNOSIS — E1069 Type 1 diabetes mellitus with other specified complication: Secondary | ICD-10-CM

## 2023-02-01 DIAGNOSIS — Z0000 Encounter for general adult medical examination without abnormal findings: Secondary | ICD-10-CM

## 2023-02-01 NOTE — Telephone Encounter (Signed)
Latest Reference Range & Units 02/09/22 08:19 03/09/22 07:59 09/10/22 [id number]  BASIC METABOLIC PANEL    Rpt !  COMPREHENSIVE METABOLIC PANEL  Rpt !    Sodium 135 - 145 mEq/L 141  138  Potassium 3.5 - 5.1 mEq/L 4.3  3.8  Chloride 96 - 112 mEq/L 109  104  CO2 19 - 32 mEq/L 24  26  Glucose 70 - 99 mg/dL 184 (H)  123 (H)  BUN 6 - 23 mg/dL 16  18  Creatinine 0.40 - 1.50 mg/dL 1.06  1.07  Calcium 8.4 - 10.5 mg/dL 9.0  9.6  BUN/Creatinine Ratio 6 - 22 (calc) NOT APPLICABLE    Alkaline Phosphatase 39 - 117 U/L   60  Albumin 3.5 - 5.2 g/dL   4.7  AG Ratio 1.0 - 2.5 (calc) 2.1    AST 0 - 37 U/L 24  29  ALT 0 - 53 U/L 34  39  Total Protein 6.0 - 8.3 g/dL 6.1  7.0  Bilirubin, Direct 0.0 - 0.3 mg/dL   0.1  Total Bilirubin 0.2 - 1.2 mg/dL 0.3  0.5  GFR >60.00 mL/min   79.74  Total CHOL/HDL Ratio <5.0 (calc) 3.8  3  Cholesterol 0 - 200 mg/dL 155  149  HDL Cholesterol >39.00 mg/dL 41  46.10  LDL (calc) 0 - 99 mg/dL   81  LDL Cholesterol (Calc) mg/dL (calc) 79    MICROALB/CREAT RATIO 0.0 - 30.0 mg/g   1.2  NonHDL    102.92  Non-HDL Cholesterol (Calc) <130 mg/dL (calc) 114    Triglycerides 0.0 - 149.0 mg/dL 269 (H)  112.0  VLDL 0.0 - 40.0 mg/dL   22.4  Alkaline phosphatase (APISO) 35 - 144 U/L 66    Globulin 1.9 - 3.7 g/dL (calc) 2.0    WBC 4.0 - 10.5 K/uL 7.2 8.0 6.2  RBC 4.22 - 5.81 Mil/uL 4.91 5.02 5.41  Hemoglobin 13.0 - 17.0 g/dL 14.9 15.5 16.5  HCT 39.0 - 52.0 % 44.7 46.2 49.2  MCV 78.0 - 100.0 fl 91.0 92.1 91.0  MCH 27.0 - 33.0 pg 30.3    MCHC 30.0 - 36.0 g/dL 33.3 33.4 33.6  RDW 11.5 - 15.5 % 13.5 13.6 13.5  Platelets 150.0 - 400.0 K/uL 280 244.0 245.0  MPV 7.5 - 12.5 fL 10.7    Neutrophils 43.0 - 77.0 %  54.1 47.5  Lymphocytes 12.0 - 46.0 %  33.2 38.6  Monocytes Relative 3.0 - 12.0 %  8.7 10.0  Eosinophil 0.0 - 5.0 %  3.5 3.3  Basophil 0.0 - 3.0 %  0.5 0.6  NEUT# 1.4 - 7.7 K/uL  4.3 3.0  Lymphocyte # 0.7 - 4.0 K/uL  2.6 2.4  Monocyte # 0.1 - 1.0 K/uL  0.7 0.6  Eosinophils  Absolute 0.0 - 0.7 K/uL  0.3 0.2  Basophils Absolute 0.0 - 0.1 K/uL  0.0 0.0  Hemoglobin A1C 4.6 - 6.5 %  7.1 (H) 7.2 (H)  TSH 0.35 - 5.50 uIU/mL  2.54 2.31  !: Data is abnormal (H): Data is abnormally high Rpt: View report in Results Review for more information  Last labs Nov 2023 stable LDL 81  ordered atorvastatin 20mg  take 1/2 tab po daily #90 RF1 for patient fills at [facility].  Next lipid panel due Nov 2024 ordered executive panel, Hgba1c Mg and Vitamin D Patient met Be Well requirement for LDL less than 130 and BP less than 135/85 please complete paperwork with patient.  RN [employer] notified.  10/14/22 VITAL SIGNS Heart Rate 77 /min 10/14/2022    Temperature 98.4 degrees Fahrenheit 10/14/2022    Oximetry 97 % 10/14/2022    Blood pressure diastolic 72 mm Hg [id number]    Height 70.5 in 10/14/2022    Blood pressure systolic [id number] mm Hg [id number]    Weight 169.4 lbs 10/14/2022    BMI 23.96 kg/m2 10/14/2022

## 2023-02-01 NOTE — Telephone Encounter (Signed)
Patient stated has appt with PCM in April and will do labs at that appt

## 2023-02-07 ENCOUNTER — Encounter: Payer: Self-pay | Admitting: Registered Nurse

## 2023-02-07 ENCOUNTER — Ambulatory Visit: Payer: No Typology Code available for payment source | Admitting: Occupational Medicine

## 2023-02-07 ENCOUNTER — Telehealth: Payer: Self-pay | Admitting: Registered Nurse

## 2023-02-07 DIAGNOSIS — E1069 Type 1 diabetes mellitus with other specified complication: Secondary | ICD-10-CM

## 2023-02-07 DIAGNOSIS — Z0000 Encounter for general adult medical examination without abnormal findings: Secondary | ICD-10-CM

## 2023-02-07 DIAGNOSIS — E1165 Type 2 diabetes mellitus with hyperglycemia: Secondary | ICD-10-CM

## 2023-02-07 DIAGNOSIS — J302 Other seasonal allergic rhinitis: Secondary | ICD-10-CM

## 2023-02-07 DIAGNOSIS — J301 Allergic rhinitis due to pollen: Secondary | ICD-10-CM

## 2023-02-07 MED ORDER — SALINE SPRAY 0.65 % NA SOLN: 2.0000 | NASAL | 0 refills | Status: DC

## 2023-02-07 MED ORDER — FLUTICASONE PROPIONATE 50 MCG/ACT NA SUSP: 1.0000 | Freq: Two times a day (BID) | NASAL | 1 refills | Status: DC

## 2023-02-07 NOTE — Telephone Encounter (Signed)
RN Kimrey notified patient Rx sent to his pharmacy of choice by NP today for refill.  Fluticasone nasal 25mcg 1 spray each nostril BID #16g RF1  Encourage nasal saline spray use 2 sprays each nostril q2h wa prn congestion/pruritus also.

## 2023-02-07 NOTE — Progress Notes (Signed)
Patient requested allergy medication. Patient given loratadine. Patient also request Flonase to be called in [facility] on [address]. NP made aware.   Be well insurance premium discount evaluation:    Patient completed PCM office visit epic reviewed by RN Evlyn Kanner and transcribed. Labs  Tobacco attestation signed. [facility] ROI formed signed. Forms placed in the chart.   Patient given handouts for [facility] pharmacies and discount drugs list,MyChart, Tele doc setup, Tele doc Behavioral, [place] counseling and [facility] counseling.  What to do for infectious illness protocol. Given handout for list of medications that can be filled at [facility]. Given Clinic hours and Clinic Email.

## 2023-02-14 LAB — HEMOGLOBIN A1C: Hemoglobin A1C: 7

## 2023-02-27 ENCOUNTER — Other Ambulatory Visit: Payer: Self-pay | Admitting: Family Medicine

## 2023-03-10 ENCOUNTER — Other Ambulatory Visit: Payer: Self-pay | Admitting: Family Medicine

## 2023-03-11 ENCOUNTER — Ambulatory Visit (INDEPENDENT_AMBULATORY_CARE_PROVIDER_SITE_OTHER): Payer: No Typology Code available for payment source | Admitting: Family Medicine

## 2023-03-11 ENCOUNTER — Encounter: Payer: Self-pay | Admitting: Family Medicine

## 2023-03-11 VITALS — BP 102/64 | HR 72 | Temp 97.9°F | Resp 17 | Ht 70.0 in | Wt 164.1 lb

## 2023-03-11 DIAGNOSIS — Z125 Encounter for screening for malignant neoplasm of prostate: Secondary | ICD-10-CM

## 2023-03-11 DIAGNOSIS — E119 Type 2 diabetes mellitus without complications: Secondary | ICD-10-CM

## 2023-03-11 DIAGNOSIS — Z0000 Encounter for general adult medical examination without abnormal findings: Secondary | ICD-10-CM

## 2023-03-11 LAB — CBC WITH DIFFERENTIAL/PLATELET
Basophils Absolute: 0 10*3/uL (ref 0.0–0.1)
Basophils Relative: 0.5 % (ref 0.0–3.0)
Eosinophils Absolute: 0.3 10*3/uL (ref 0.0–0.7)
Eosinophils Relative: 4.2 % (ref 0.0–5.0)
HCT: 50.4 % (ref 39.0–52.0)
Hemoglobin: 17 g/dL (ref 13.0–17.0)
Lymphocytes Relative: 35 % (ref 12.0–46.0)
Lymphs Abs: 2.4 10*3/uL (ref 0.7–4.0)
MCHC: 33.8 g/dL (ref 30.0–36.0)
MCV: 89.6 fl (ref 78.0–100.0)
Monocytes Absolute: 1 10*3/uL (ref 0.1–1.0)
Monocytes Relative: 13.9 % — ABNORMAL HIGH (ref 3.0–12.0)
Neutro Abs: 3.2 10*3/uL (ref 1.4–7.7)
Neutrophils Relative %: 46.4 % (ref 43.0–77.0)
Platelets: 269 10*3/uL (ref 150.0–400.0)
RBC: 5.62 Mil/uL (ref 4.22–5.81)
RDW: 13.1 % (ref 11.5–15.5)
WBC: 6.9 10*3/uL (ref 4.0–10.5)

## 2023-03-11 LAB — BASIC METABOLIC PANEL
BUN: 18 mg/dL (ref 6–23)
CO2: 26 mEq/L (ref 19–32)
Calcium: 9.3 mg/dL (ref 8.4–10.5)
Chloride: 102 mEq/L (ref 96–112)
Creatinine, Ser: 1.31 mg/dL (ref 0.40–1.50)
GFR: 62.33 mL/min (ref >60.00)
Glucose, Bld: 148 mg/dL — ABNORMAL HIGH (ref 70–99)
Potassium: 4.3 mEq/L (ref 3.5–5.1)
Sodium: 138 mEq/L (ref 135–145)

## 2023-03-11 LAB — HEPATIC FUNCTION PANEL
ALT: 42 U/L (ref 0–53)
AST: 35 U/L (ref 0–37)
Albumin: 4.7 g/dL (ref 3.5–5.2)
Alkaline Phosphatase: 86 U/L (ref 39–117)
Bilirubin, Direct: 0.1 mg/dL (ref 0.0–0.3)
Total Bilirubin: 0.5 mg/dL (ref 0.2–1.2)
Total Protein: 7.1 g/dL (ref 6.0–8.3)

## 2023-03-11 LAB — LIPID PANEL
Cholesterol: 142 mg/dL (ref 0–200)
HDL: 40.6 mg/dL (ref >39.00)
NonHDL: 100.93
Total CHOL/HDL Ratio: 3
Triglycerides: 240 mg/dL — ABNORMAL HIGH (ref 0.0–149.0)
VLDL: 48 mg/dL — ABNORMAL HIGH (ref 0.0–40.0)

## 2023-03-11 LAB — PSA: PSA: 0.61 ng/mL (ref 0.10–4.00)

## 2023-03-11 LAB — TSH: TSH: 2.62 u[IU]/mL (ref 0.35–5.50)

## 2023-03-11 LAB — LDL CHOLESTEROL, DIRECT: Direct LDL: 85 mg/dL

## 2023-03-11 NOTE — Progress Notes (Signed)
   Subjective:    Patient ID: Frederick Abbott, male    DOB: Oct 25, 1971, 52 y.o.   MRN: [id number]  HPI CPE- UTD on eye exam, kidney exam, colonoscopy, immunizations.  Pt reports feeling good.  Patient Care Team    Relationship Specialty Notifications Start End  Sheliah Hatch, MD PCP - General Family Medicine  08/31/12   Cliffton Asters, MD (Inactive) PCP - Infectious Diseases Infectious Diseases  10/21/10   Meryl Dare, MD Consulting Physician Gastroenterology  09/11/15   Marlene Lard, MD Consulting Physician Endocrinology  12/20/19     Health Maintenance  Topic Date Due   HEMOGLOBIN A1C  03/11/2023   INFLUENZA VACCINE  06/09/2023   DTaP/Tdap/Td (2 - Td or Tdap) 08/10/2023   Diabetic kidney evaluation - eGFR measurement  09/11/2023   Diabetic kidney evaluation - Urine ACR  09/11/2023   OPHTHALMOLOGY EXAM  10/15/2023   FOOT EXAM  03/10/2024   COLONOSCOPY (Pts 45-83yrs Insurance coverage will need to be confirmed)  10/26/2025   Hepatitis C Screening  Completed   HIV Screening  Completed   Zoster Vaccines- Shingrix  Completed   HPV VACCINES  Aged Out   COVID-19 Vaccine  Discontinued      Review of Systems Patient reports no vision/hearing changes, anorexia, fever ,adenopathy, persistant/recurrent hoarseness, swallowing issues, chest pain, palpitations, edema, persistant/recurrent cough, hemoptysis, gastrointestinal  bleeding (melena, rectal bleeding), abdominal pain, excessive heart burn, GU symptoms (dysuria, hematuria, voiding/incontinence issues) syncope, focal weakness, memory loss, numbness & tingling, skin/hair/nail changes, depression, anxiety, abnormal bruising/bleeding, musculoskeletal symptoms/signs.   + episodic SOB w/ minimal exertion- occurs 'randomly'.    Objective:   Physical Exam General Appearance:    Alert, cooperative, no distress, appears stated age  Head:    Normocephalic, without obvious abnormality, atraumatic  Eyes:    PERRL,  conjunctiva/corneas clear, EOM's intact both eyes       Ears:    Normal TM's and external ear canals, both ears  Nose:   Nares normal, septum midline, mucosa normal, no drainage   or sinus tenderness  Throat:   Lips, mucosa, and tongue normal; teeth and gums normal  Neck:   Supple, symmetrical, trachea midline, no adenopathy;       thyroid:  No enlargement/tenderness/nodules  Back:     Symmetric, no curvature, ROM normal, no CVA tenderness  Lungs:     Clear to auscultation bilaterally, respirations unlabored  Chest wall:    No tenderness or deformity  Heart:    Regular rate and rhythm, S1 and S2 normal, no murmur, rub   or gallop  Abdomen:     Soft, non-tender, bowel sounds active all four quadrants,    no masses, no organomegaly  Genitalia:    deferred  Rectal:    Extremities:   Extremities normal, atraumatic, no cyanosis or edema  Pulses:   2+ and symmetric all extremities  Skin:   Skin color, texture, turgor normal, no rashes or lesions  Lymph nodes:   Cervical, supraclavicular, and axillary nodes normal  Neurologic:   CNII-XII intact. Normal strength, sensation and reflexes      throughout          Assessment & Plan:

## 2023-03-11 NOTE — Assessment & Plan Note (Signed)
Chronic problem.  Following w/ Endo.  UTD on eye exam, microalbumin.  Foot exam done today.  Recent A1C 7%.  Check lipids, thyroid, etc and adjust meds prn.

## 2023-03-11 NOTE — Assessment & Plan Note (Signed)
Pt's PE WNL.  UTD on colonoscopy, immunizations.  Check labs.  Anticipatory guidance provided.  

## 2023-03-11 NOTE — Patient Instructions (Signed)
Follow up in 6 months to recheck BP and cholesterol We'll notify you of your lab results and make any changes if needed Keep up the good work on healthy diet and regular exercise- you look great!! I think the shortness of breath is humidity and pollen related but keep track of your symptoms and let me know if it's becoming more frequent or severe Call with any questions or concerns Stay Safe!  Stay Healthy! Have a great summer!!!

## 2023-03-14 ENCOUNTER — Telehealth: Payer: Self-pay

## 2023-03-14 NOTE — Telephone Encounter (Signed)
Left results on pt VM  

## 2023-03-14 NOTE — Telephone Encounter (Signed)
-----   Message from Sheliah Hatch, MD sent at 03/14/2023  7:34 AM EDT ----- Labs are stable and look good.  No changes at this time

## 2023-03-27 ENCOUNTER — Other Ambulatory Visit: Payer: Self-pay | Admitting: Family Medicine

## 2023-03-28 ENCOUNTER — Other Ambulatory Visit: Payer: Self-pay

## 2023-03-28 DIAGNOSIS — B20 Human immunodeficiency virus [HIV] disease: Secondary | ICD-10-CM

## 2023-03-28 MED ORDER — BIKTARVY 50-200-25 MG PO TABS
1.0000 | ORAL_TABLET | Freq: Every day | ORAL | 0 refills | Status: DC
Start: 2023-03-28 — End: 2023-04-05

## 2023-04-03 NOTE — Telephone Encounter (Signed)
Patient had labs completed with PCM  HGba1c 7 and LDL less than 130  BP 102/64 please complete Be Well paperwork with patient   Latest Reference Range & Units 02/14/23 00:00 03/11/23 09:28  Sodium 135 - 145 mEq/L  138  Potassium 3.5 - 5.1 mEq/L  4.3  Chloride 96 - 112 mEq/L  102  CO2 19 - 32 mEq/L  26  Glucose 70 - 99 mg/dL  161 (H)  BUN 6 - 23 mg/dL  18  Creatinine 0.96 - 1.50 mg/dL  0.45  Calcium 8.4 - 40.9 mg/dL  9.3  Alkaline Phosphatase 39 - 117 U/L  86  Albumin 3.5 - 5.2 g/dL  4.7  AST 0 - 37 U/L  35  ALT 0 - 53 U/L  42  Total Protein 6.0 - 8.3 g/dL  7.1  Bilirubin, Direct 0.0 - 0.3 mg/dL  0.1  Total Bilirubin 0.2 - 1.2 mg/dL  0.5  GFR >81.19 mL/min  62.33  Total CHOL/HDL Ratio   3  Cholesterol 0 - 200 mg/dL  147  HDL Cholesterol >82.95 mg/dL  62.13  Direct LDL mg/dL  08.6  NonHDL   578.46  Triglycerides 0.0 - 149.0 mg/dL  962.9 (H)  VLDL 0.0 - 40.0 mg/dL  52.8 (H)  WBC 4.0 - 41.3 K/uL  6.9  RBC 4.22 - 5.81 Mil/uL  5.62  Hemoglobin 13.0 - 17.0 g/dL  24.4  HCT 01.0 - 27.2 %  50.4  MCV 78.0 - 100.0 fl  89.6  MCHC 30.0 - 36.0 g/dL  53.6  RDW 64.4 - 03.4 %  13.1  Platelets 150.0 - 400.0 K/uL  269.0  Neutrophils 43.0 - 77.0 %  46.4  Lymphocytes 12.0 - 46.0 %  35.0  Monocytes Relative 3.0 - 12.0 %  13.9 (H)  Eosinophil 0.0 - 5.0 %  4.2  Basophil 0.0 - 3.0 %  0.5  NEUT# 1.4 - 7.7 K/uL  3.2  Lymphocyte # 0.7 - 4.0 K/uL  2.4  Monocyte # 0.1 - 1.0 K/uL  1.0  Eosinophils Absolute 0.0 - 0.7 K/uL  0.3  Basophils Absolute 0.0 - 0.1 K/uL  0.0  Hemoglobin A1C  7 (E)   TSH 0.35 - 5.50 uIU/mL  2.62  PSA 0.10 - 4.00 ng/mL  0.61  (H): Data is abnormally high (E): External lab result

## 2023-04-05 ENCOUNTER — Other Ambulatory Visit: Payer: Self-pay

## 2023-04-05 ENCOUNTER — Ambulatory Visit: Payer: No Typology Code available for payment source | Admitting: Infectious Diseases

## 2023-04-05 ENCOUNTER — Encounter: Payer: Self-pay | Admitting: Infectious Diseases

## 2023-04-05 ENCOUNTER — Other Ambulatory Visit (HOSPITAL_COMMUNITY)
Admission: RE | Admit: 2023-04-05 | Discharge: 2023-04-05 | Disposition: A | Discharge status: Home / Self Care | Payer: No Typology Code available for payment source | Source: Ambulatory Visit | Attending: Infectious Diseases | Admitting: Infectious Diseases

## 2023-04-05 VITALS — BP 124/75 | HR 94 | Temp 98.1°F | Resp 16 | Wt 167.0 lb

## 2023-04-05 DIAGNOSIS — B20 Human immunodeficiency virus [HIV] disease: Secondary | ICD-10-CM

## 2023-04-05 DIAGNOSIS — Z0000 Encounter for general adult medical examination without abnormal findings: Secondary | ICD-10-CM

## 2023-04-05 DIAGNOSIS — Z129 Encounter for screening for malignant neoplasm, site unspecified: Secondary | ICD-10-CM | POA: Insufficient documentation

## 2023-04-05 DIAGNOSIS — Z113 Encounter for screening for infections with a predominantly sexual mode of transmission: Secondary | ICD-10-CM | POA: Diagnosis present

## 2023-04-05 MED ORDER — BIKTARVY 50-200-25 MG PO TABS: 1.0000 | ORAL_TABLET | Freq: Every day | ORAL | 11 refills | Status: DC

## 2023-04-05 NOTE — Progress Notes (Unsigned)
[address], [place], [place], [address]                                                                  Phn. [phone]; Fax: [phone]                                                                             Date: 04/06/23  Reason for Visit: HIV follow up   HPI: Frederick Abbott is a 52 y.o.old male with a history of DM2, HLD, HTN, GERD, Anxiety/Depression who is here for Fu. Patient previously followed by Dr [name] for several years.   Last seen on 03/02/22. Well controlled on Biktarvy Lab Results  Component Value Date   HIV1RNAQUANT Not Detected 02/09/2022   Lab Results  Component Value Date   CD4TABS 842 02/09/2022   CD4TABS 866 02/12/2021   CD4TABS 868 12/03/2019   Prior ART: Atripla, switched to Genvoya in 04/22/2016 for safer ART> switched to [facility] 12/06/2018 - current  Prior Genotype geonotype in 10/20/2017 not available   Prophylaxis Organism Indicated Treatment  PCP no  MAC no   Interval hx/current visit: Taking Bijtarvy consistently, denies missing doses. He follows PCP for management of DM as well as age based ca screening. He does not follow dentist and willing to see dental clinic at [facility]. Sexually active with a long term male partner who is also HIV and under tx. He does both oral and anal intercourse, rarely a receptive partner. Not willing for STD screening. Works at a [employer]. Drinks alcohol occasionally and weed, denies smoking. He is willing to do anal pap today. No complaints otherwise.   ROS: As stated in above HPI; all other systems were reviewed and are otherwise negative unless noted below  No reported fever / chills, night sweats, unintentional weight loss, acute visual change, odynophagia, chest pain/pressure, new or worsened SOB or WOB,  nausea, vomiting, diarrhea, dysuria, GU discharge, syncope, seizures, red/hot swollen joints, hallucinations / delusions, rashes, new allergies, unusual / excessive bleeding, swollen lymph nodes, or new hospitalizations/ED visits/Urgent Care visits since the pt was last seen.  PMH/ PSH/ FamHx / Social Hx , medications and allergies reviewed and updated as appropriate; please see corresponding tab in EHR / prior notes                                        Current Outpatient Medications on File Prior to Visit  Medication Sig Dispense Refill   ALPRAZolam (XANAX) 0.5 MG tablet Take 1 tablet (0.5 mg  total) by mouth 2 (two) times daily as needed for anxiety. 30 tablet 1   aspirin 81 MG tablet Take 81 mg by mouth daily.     atorvastatin (LIPITOR) 10 MG tablet TAKE 1 TABLET BY MOUTH EVERY DAY AT NIGHT 90 tablet 1   BD PEN NEEDLE NANO U/F 32G X 4 MM MISC USE 1 NEEDLE TO INJECT 24 UNITS INTO THE SKIN DAILY. 100 each 3   buPROPion (WELLBUTRIN XL) 300 MG 24 hr tablet TAKE 1 TABLET BY MOUTH EVERY DAY 90 tablet 0   Continuous Blood Gluc Sensor (FREESTYLE LIBRE 14 DAY SENSOR) MISC CHANGE EVERY 14 DAYS 2 each 3   fenofibrate 160 MG tablet TAKE 1 TABLET BY MOUTH EVERY DAY 90 tablet 1   fluticasone (FLONASE) 50 MCG/ACT nasal spray Place 1 spray into both nostrils 2 (two) times daily. 16 g 1   Glucagon (GVOKE HYPOPEN 2-PACK) 1 MG/0.2ML SOAJ Inject 1 pen into the skin as needed.     Glucagon, rDNA, (GLUCAGON EMERGENCY) 1 MG KIT Inject 1 mg as directed as needed for up to 2 doses (administer when CBG is less than 55 and repeat x1 in 15 minutes if needed). 1 kit 2   glucose blood test strip 1 each by Other route 3 (three) times daily as needed. Use as directed.     ibuprofen (ADVIL,MOTRIN) 200 MG tablet Take 600 mg by mouth every 6 (six) hours as needed for moderate pain.     insulin glargine, 1 Unit Dial, (TOUJEO SOLOSTAR) 300 UNIT/ML Solostar Pen 12 units     insulin lispro (HUMALOG KWIKPEN) 200 UNIT/ML KwikPen 4  units+correctional     JARDIANCE 25 MG TABS tablet Take 25 mg by mouth daily.     lisinopril (ZESTRIL) 5 MG tablet TAKE 1 TABLET (5 MG TOTAL) BY MOUTH DAILY. 90 tablet 1   magnesium oxide (MAG-OX) 400 MG tablet Take 400 mg by mouth daily.     Menthol 5 % PTCH Apply 1 patch topically as needed (knee pain).     mineral oil-hydrophilic petrolatum (AQUAPHOR) ointment Apply topically as needed for dry skin. 420 g 0   Multiple Vitamin (MULTIVITAMIN WITH MINERALS) TABS tablet Take 1 tablet by mouth daily.     ondansetron (ZOFRAN-ODT) 4 MG disintegrating tablet Take 1 tablet (4 mg total) by mouth every 8 (eight) hours as needed for nausea or vomiting. 20 tablet 0   pantoprazole (PROTONIX) 40 MG tablet TAKE 1 TABLET BY MOUTH EVERY DAY 90 tablet 1   Probiotic Product (PROBIOTIC DAILY PO) Take by mouth.     RYBELSUS 14 MG TABS      sucralfate (CARAFATE) 1 g tablet Take 1 tablet (1 g total) by mouth 4 (four) times daily -  with meals and at bedtime. 40 tablet 0   tadalafil (CIALIS) 20 MG tablet TAKE 1/2 TO 1 TABLET BY MOUTH AS NEEDED AS DIRECTED 6 tablet 8   loratadine (CLARITIN) 10 MG tablet Take 10 mg by mouth daily. (Patient not taking: Reported on 03/11/2023)     montelukast (SINGULAIR) 10 MG tablet TAKE 1 TABLET BY MOUTH EVERYDAY AT BEDTIME (Patient not taking: Reported on 03/11/2023) 90 tablet 1   sodium chloride (OCEAN) 0.65 % SOLN nasal spray Place 2 sprays into both nostrils every 2 (two) hours while awake.  0   No current facility-administered medications on file prior to visit.    Allergies  Allergen Reactions   Phenytoin Other (See Comments)    Childhood pt unsure Other  reaction(s): Unknown   Metformin And Related Nausea And Vomiting   Metformin Hcl Other (See Comments)    Other reaction(s): Upset Stomach   Other Other (See Comments)   Past Medical History:  Diagnosis Date   Anxiety    Belching    Cholecystitis chronic    Depression    Diarrhea    "comes and goes" (12/14/2017)   GERD  (gastroesophageal reflux disease)    Hepatitis A    Hepatitis B    HIV (human immunodeficiency virus infection) (HCC) dx'd 12/18/1995   Hypercholesterolemia    Neuromuscular disorder (HCC)    neuropathy   Neuropathy    Pneumonia    childhood   Seasonal allergies    Seizures (HCC)    "as a child, none since age 1; sister had them too" (12/14/2017)   Type II diabetes mellitus (HCC)    Past Surgical History:  Procedure Laterality Date   APPENDECTOMY  12/14/2017   CHOLECYSTECTOMY  05/29/2012   Procedure: LAPAROSCOPIC CHOLECYSTECTOMY WITH INTRAOPERATIVE CHOLANGIOGRAM;  Surgeon: Wilmon Arms. Corliss Skains, MD;  Location: [facility];  Service: General;  Laterality: N/A;   JOINT REPLACEMENT     LAPAROSCOPIC APPENDECTOMY N/A 12/14/2017   Procedure: APPENDECTOMY LAPAROSCOPIC;  Surgeon: Andria Meuse, MD;  Location: [facility];  Service: General;  Laterality: N/A;   TONSILLECTOMY  1973   TOTAL HIP ARTHROPLASTY Bilateral 01/2005-08/2005   "left-right"   Social History   Socioeconomic History   Marital status: Married    Spouse name: Not on file   Number of children: Not on file   Years of education: Not on file   Highest education level: Not on file  Occupational History   Not on file  Tobacco Use   Smoking status: Former    Packs/day: 0.80    Years: 10.00    Additional pack years: 0.00    Total pack years: 8.00    Types: Cigarettes    Quit date: 05/06/2006    Years since quitting: 16.9   Smokeless tobacco: Never  Vaping Use   Vaping Use: Never used  Substance and Sexual Activity   Alcohol use: Yes    Alcohol/week: 2.0 standard drinks of alcohol    Types: 2 Cans of beer per week   Drug use: Not Currently    Types: Marijuana    Comment: 12/14/2017 'last used 1 wk ago"   Sexual activity: Yes    Partners: Male    Comment: declined condoms 03/03/21  Other Topics Concern   Not on file  Social History Narrative   Not on file   Social Determinants of Health   Financial Resource Strain: Not on  file  Food Insecurity: Not on file  Transportation Needs: Not on file  Physical Activity: Not on file  Stress: Not on file  Social Connections: Not on file  Intimate Partner Violence: Not on file   Family History  Problem Relation Age of Onset   Cancer Mother        breast   Cancer Maternal Grandmother        melanoma-nose   Cancer Paternal Grandfather        melanoma-ear   Colon cancer Neg Hx    Esophageal cancer Neg Hx    Rectal cancer Neg Hx    Stomach cancer Neg Hx     Vitals BP 124/75   Pulse 94   Temp 98.1 F (36.7 C) (Temporal)   Resp 16   Wt 167 lb (75.8 kg)  SpO2 97%   BMI 23.96 kg/m   Examination  Gen: no acute distress HEENT: /AT, no scleral icterus, no pale conjunctivae, hearing normal, oral mucosa moist Neck: Supple Cardio: Regular rate and rhythm Resp: Pulmonary effort normal in room air GI: nondistended GU: Musc: Extremities: No pedal edema Skin: No rashes Neuro: grossly non focal , awake, alert and oriented * 3, ambulatory, following commands.  Psych: Calm, cooperative  Lab Results HIV 1 RNA Quant  Date Value  02/09/2022 Not Detected Copies/mL  02/12/2021 Not Detected Copies/mL  12/03/2019 <20 NOT DETECTED copies/mL   CD4 T Cell Abs (/uL)  Date Value  02/09/2022 842  02/12/2021 866  12/03/2019 868   No results found for: "HIV1GENOSEQ" Lab Results  Component Value Date   WBC 6.9 03/11/2023   HGB 17.0 03/11/2023   HCT 50.4 03/11/2023   MCV 89.6 03/11/2023   PLT 269.0 03/11/2023    Lab Results  Component Value Date   CREATININE 1.31 03/11/2023   BUN 18 03/11/2023   NA 138 03/11/2023   K 4.3 03/11/2023   CL 102 03/11/2023   CO2 26 03/11/2023   Lab Results  Component Value Date   ALT 42 03/11/2023   AST 35 03/11/2023   GGT 51 06/16/2018   ALKPHOS 86 03/11/2023   BILITOT 0.5 03/11/2023    Lab Results  Component Value Date   CHOL 142 03/11/2023   TRIG 240.0 (H) 03/11/2023   HDL 40.60 03/11/2023   LDLCALC 81  09/10/2022   Lab Results  Component Value Date   HAV REACTIVE (A) 12/31/2014   Lab Results  Component Value Date   HEPBSAG NEGATIVE 12/27/2014   HEPBSAB POS (A) 12/27/2014   Lab Results  Component Value Date   HCVAB NEGATIVE 12/27/2014   Lab Results  Component Value Date   CHLAMYDIAWP Negative 02/12/2021   N Negative 02/12/2021   No results found for: "GCPROBEAPT" No results found for: "QUANTGOLD"    Health Maintenance: Immunization History  Administered Date(s) Administered   H1N1 10/17/2008   Influenza Inj Mdck Quad Pf 07/16/2018   Influenza Split 09/23/2011   Influenza Whole 07/19/2006, 08/18/2007, 08/20/2008, 08/19/2009, 08/17/2010   Influenza,inj,Quad PF,6+ Mos 08/09/2013, 08/15/2014, 08/15/2014, 09/11/2015, 08/17/2016, 07/16/2018, 07/24/2019, 08/24/2021, 08/13/2022   Influenza-Unspecified 08/19/2017, 08/21/2020, 08/10/2022   Janssen (J&J) SARS-COV-2 Vaccination 01/20/2020   Moderna Sars-Covid-2 Vaccination 09/05/2020, 03/27/2021, 09/11/2022   Pneumococcal Conjugate-13 12/06/2018   Pneumococcal Polysaccharide-23 06/09/2006, 09/23/2011   Tdap 08/09/2013   Zoster Recombinat (Shingrix) 05/08/2020, 06/24/2020   Assessment/Plan: # HIV in MSM Well controlled Continue [employer] today  He prefers to keep 1 year fu  # STD screening  Urine GC and RPR.  Declined oral and anal GC No acute concerns   # Anxiety/depression Stable   # immunization  Will discuss Menveo vaccine next visit  Tdap due in October 2024   #Health maintenance Hep C status HCV ab negative 2016 Syphilis negative 02/2022 TB testing will order next visit  GC/Chlamydia urine GC today LDL 85 03/11/23 OI ppx if indicated None  HLA B5701 Neg 04/2016 Anal Pap today  Dental Care discussed  Colonoscopy 10/2015 - normal, next due in 2026.   Patient's labs were reviewed as well as his previous records. Patients questions were addressed and answered. Safe sex counseling done.  I have  personally spent 48 minutes involved in face-to-face and non-face-to-face activities for this patient on the day of the visit. Professional time spent includes the following activities: Preparing to see the patient (  review of tests), Obtaining and/or reviewing separately obtained history (admission/discharge record), Performing a medically appropriate examination and/or evaluation , Ordering medications/tests/procedures, referring and communicating with other health care professionals, Documenting clinical information in the EMR, Independently interpreting results (not separately reported), Communicating results to the patient/family/caregiver, Counseling and educating the patient/family/caregiver and Care coordination (not separately reported).    Electronically signed by:  Odette Fraction, MD Infectious Disease Physician [facility] [address] [place], [place] [address] Phone: [phone]  Fax: [phone]

## 2023-04-06 DIAGNOSIS — Z113 Encounter for screening for infections with a predominantly sexual mode of transmission: Secondary | ICD-10-CM | POA: Insufficient documentation

## 2023-04-06 DIAGNOSIS — Z0000 Encounter for general adult medical examination without abnormal findings: Secondary | ICD-10-CM | POA: Insufficient documentation

## 2023-04-06 DIAGNOSIS — Z129 Encounter for screening for malignant neoplasm, site unspecified: Secondary | ICD-10-CM | POA: Insufficient documentation

## 2023-04-06 LAB — URINE CYTOLOGY ANCILLARY ONLY
Chlamydia: NEGATIVE
Comment: NEGATIVE
Comment: NORMAL
Neisseria Gonorrhea: NEGATIVE

## 2023-04-07 ENCOUNTER — Ambulatory Visit: Payer: No Typology Code available for payment source | Admitting: Occupational Medicine

## 2023-04-07 DIAGNOSIS — Z0000 Encounter for general adult medical examination without abnormal findings: Secondary | ICD-10-CM

## 2023-04-07 LAB — T-HELPER CELLS (CD4) COUNT (NOT AT ARMC)
CD4 % Helper T Cell: 40 % (ref 33–65)
CD4 T Cell Abs: 1058 /uL (ref 400–1790)

## 2023-04-07 NOTE — Progress Notes (Signed)
Be well insurance premium discount evaluation: Met   Patient completed PCM office visit. Epic reviewed by RN Kimrey transcribed labs and reviewed with the patient. Scheduled follow up labs.   Tobacco attestation signed. [facility] ROI formed signed. Forms placed in the chart.   Patient given handouts for [facility] pharmacies and discount drugs list, MyChart, Tele doc Medical, Tele doc Behavioral, [place] counseling and [facility] counseling.  What to do for infectious illness protocol. Given handout for list of medications that can be filled at [facility]. Given Clinic hours and Clinic Email.  

## 2023-04-08 LAB — HIV RNA, RTPCR W/R GT (RTI, PI,INT)
HIV 1 RNA Quant: NOT DETECTED copies/mL
HIV-1 RNA Quant, Log: NOT DETECTED Log copies/mL

## 2023-04-08 LAB — RPR: RPR Ser Ql: NONREACTIVE

## 2023-04-11 LAB — CYTOLOGY - PAP: Diagnosis: NEGATIVE

## 2023-04-21 ENCOUNTER — Ambulatory Visit: Payer: No Typology Code available for payment source | Admitting: Occupational Medicine

## 2023-04-21 DIAGNOSIS — T148XXA Other injury of unspecified body region, initial encounter: Secondary | ICD-10-CM

## 2023-04-21 NOTE — Progress Notes (Signed)
Blister to medial inner pinky toe. Applied Telfa dressing encourage to keep dry. Monitor for signs of infection.

## 2023-04-22 NOTE — Telephone Encounter (Signed)
Be Well 2025 paperwork signed 08 Feb 2023

## 2023-04-28 ENCOUNTER — Other Ambulatory Visit: Payer: Self-pay | Admitting: Family Medicine

## 2023-05-14 ENCOUNTER — Other Ambulatory Visit: Payer: Self-pay | Admitting: Family Medicine

## 2023-05-18 ENCOUNTER — Other Ambulatory Visit: Payer: No Typology Code available for payment source | Admitting: Occupational Medicine

## 2023-05-18 DIAGNOSIS — Z0000 Encounter for general adult medical examination without abnormal findings: Secondary | ICD-10-CM

## 2023-05-18 NOTE — Progress Notes (Signed)
Lab drawn from Left AC tolerated well no issues noted.   

## 2023-05-19 ENCOUNTER — Telehealth: Payer: Self-pay | Admitting: Registered Nurse

## 2023-05-19 ENCOUNTER — Encounter: Payer: Self-pay | Admitting: Registered Nurse

## 2023-05-19 DIAGNOSIS — I10 Essential (primary) hypertension: Secondary | ICD-10-CM

## 2023-05-19 DIAGNOSIS — E1069 Type 1 diabetes mellitus with other specified complication: Secondary | ICD-10-CM

## 2023-05-19 LAB — VITAMIN D 25 HYDROXY (VIT D DEFICIENCY, FRACTURES): Vit D, 25-Hydroxy: 41.5 ng/mL (ref 30.0–100.0)

## 2023-05-19 LAB — MAGNESIUM: Magnesium: 2.1 mg/dL (ref 1.6–2.3)

## 2023-05-19 LAB — HEMOGLOBIN A1C
Est. average glucose Bld gHb Est-mCnc: 171 mg/dL
Hgb A1c MFr Bld: 7.6 % — ABNORMAL HIGH (ref 4.8–5.6)

## 2023-05-19 MED ORDER — ATORVASTATIN CALCIUM 20 MG PO TABS: 10.0000 mg | ORAL_TABLET | Freq: Every day | ORAL | Status: DC

## 2023-05-19 MED ORDER — LISINOPRIL 10 MG PO TABS: 5.0000 mg | ORAL_TABLET | Freq: Every day | ORAL | Status: DC

## 2023-05-19 NOTE — Telephone Encounter (Signed)
Patient requested refill lisinopril 5mg  po daily and atorvastatin 10mg  po daily.  [facility] has 10mg  lisinopril take 1/2 tab daily and atorvastatin 20mg  take 1/2 tab daily.  Patient last filled 02/01/23 atorvastatin 20mg  90 tabs and lisinopril 04/26/22 10mg  90 tabs.  Last labs   Latest Reference Range & Units 03/11/23 09:28 05/18/23 08:22  Sodium 135 - 145 mEq/L 138   Potassium 3.5 - 5.1 mEq/L 4.3   Chloride 96 - 112 mEq/L 102   CO2 19 - 32 mEq/L 26   Glucose 70 - 99 mg/dL 161 (H)   BUN 6 - 23 mg/dL 18   Creatinine 0.96 - 1.50 mg/dL 0.45   Calcium 8.4 - 40.9 mg/dL 9.3   Magnesium 1.6 - 2.3 mg/dL  2.1  Alkaline Phosphatase 39 - 117 U/L 86   Albumin 3.5 - 5.2 g/dL 4.7   AST 0 - 37 U/L 35   ALT 0 - 53 U/L 42   Total Protein 6.0 - 8.3 g/dL 7.1   Bilirubin, Direct 0.0 - 0.3 mg/dL 0.1   Total Bilirubin 0.2 - 1.2 mg/dL 0.5   GFR >81.19 mL/min 62.33   Total CHOL/HDL Ratio  3   Cholesterol 0 - 200 mg/dL 147   HDL Cholesterol >82.95 mg/dL 62.13   Direct LDL mg/dL 08.6   NonHDL  578.46   Triglycerides 0.0 - 149.0 mg/dL 962.9 (H)   VLDL 0.0 - 40.0 mg/dL 52.8 (H)   Vitamin D, 41-LKGMWNU 30.0 - 100.0 ng/mL  41.5  WBC 4.0 - 10.5 K/uL 6.9   RBC 4.22 - 5.81 Mil/uL 5.62   Hemoglobin 13.0 - 17.0 g/dL 27.2   HCT 53.6 - 64.4 % 50.4   MCV 78.0 - 100.0 fl 89.6   MCHC 30.0 - 36.0 g/dL 03.4   RDW 74.2 - 59.5 % 13.1   Platelets 150.0 - 400.0 K/uL 269.0   Neutrophils 43.0 - 77.0 % 46.4   Lymphocytes 12.0 - 46.0 % 35.0   Monocytes Relative 3.0 - 12.0 % 13.9 (H)   Eosinophil 0.0 - 5.0 % 4.2   Basophil 0.0 - 3.0 % 0.5   NEUT# 1.4 - 7.7 K/uL 3.2   Lymphocyte # 0.7 - 4.0 K/uL 2.4   Monocyte # 0.1 - 1.0 K/uL 1.0   Eosinophils Absolute 0.0 - 0.7 K/uL 0.3   Basophils Absolute 0.0 - 0.1 K/uL 0.0   Hemoglobin A1C 4.8 - 5.6 %  7.6 (H)  Est. average glucose Bld gHb Est-mCnc mg/dL  638  TSH 7.56 - 4.33 uIU/mL 2.62   (H): Data is abnormally high Last BP 124/75 HR 94 with infectious disease clinic  04/05/23  given 90 tabs each today from [facility].  Next labs due May 2024  Patient verbalized understanding information/instructions and had no further questions at this time.

## 2023-06-18 ENCOUNTER — Other Ambulatory Visit: Payer: Self-pay | Admitting: Registered Nurse

## 2023-06-20 ENCOUNTER — Encounter: Payer: Self-pay | Admitting: Registered Nurse

## 2023-06-20 NOTE — Telephone Encounter (Signed)
Patient contacted NP and stated he did not request refill of fluticasone nasal spray.  Request refused sent to his pharmacy patient needs appt.

## 2023-07-16 ENCOUNTER — Other Ambulatory Visit: Payer: Self-pay | Admitting: Family Medicine

## 2023-07-26 ENCOUNTER — Ambulatory Visit: Payer: No Typology Code available for payment source

## 2023-07-26 ENCOUNTER — Encounter: Payer: Self-pay | Admitting: Registered Nurse

## 2023-07-26 ENCOUNTER — Telehealth: Payer: Self-pay | Admitting: Registered Nurse

## 2023-07-26 DIAGNOSIS — Z23 Encounter for immunization: Secondary | ICD-10-CM

## 2023-07-26 NOTE — Telephone Encounter (Signed)
Patient reported cut hand on metal screw at home this weekend.  Unsure when last tetanus.  Reviewed epic 10 year booster due 08/10/2023 patient notified to see RN Burna Mortimer today or tomorrow for booster vaccination.  1 inch laceration scabbed noted lateral hand clean dry no discharge or bleeding or swelling.  Patient agreed with plan of care and had no further questions at this time.

## 2023-07-26 NOTE — Progress Notes (Signed)
EE in clinic today to get an update Tdap as he brushed his elbow against a screw and was reminded that it had been about 10 years since his last tetanus shot.  Tdap injection given in left deltoid and tolerated well by EE.  Reece Packer, RN, BSN, MPH, [facility]

## 2023-08-07 NOTE — Telephone Encounter (Signed)
Patient seen in workcenter stated cut healed without difficulty encounter closed.

## 2023-08-11 ENCOUNTER — Other Ambulatory Visit: Payer: Self-pay | Admitting: Family Medicine

## 2023-08-29 ENCOUNTER — Ambulatory Visit: Payer: No Typology Code available for payment source

## 2023-08-29 DIAGNOSIS — Z23 Encounter for immunization: Secondary | ICD-10-CM

## 2023-09-03 ENCOUNTER — Other Ambulatory Visit: Payer: Self-pay | Admitting: Family Medicine

## 2023-09-16 ENCOUNTER — Ambulatory Visit: Payer: No Typology Code available for payment source | Admitting: Family Medicine

## 2023-09-16 VITALS — BP 122/80 | HR 84 | Temp 98.0°F | Ht 70.0 in | Wt 165.2 lb

## 2023-09-16 DIAGNOSIS — I10 Essential (primary) hypertension: Secondary | ICD-10-CM

## 2023-09-16 DIAGNOSIS — E781 Pure hyperglyceridemia: Secondary | ICD-10-CM | POA: Diagnosis not present

## 2023-09-16 DIAGNOSIS — E119 Type 2 diabetes mellitus without complications: Secondary | ICD-10-CM

## 2023-09-16 LAB — CBC WITH DIFFERENTIAL/PLATELET
Basophils Absolute: 0.1 10*3/uL (ref 0.0–0.1)
Basophils Relative: 0.7 % (ref 0.0–3.0)
Eosinophils Absolute: 0.3 10*3/uL (ref 0.0–0.7)
Eosinophils Relative: 3.1 % (ref 0.0–5.0)
HCT: 51.3 % (ref 39.0–52.0)
Hemoglobin: 17.3 g/dL — ABNORMAL HIGH (ref 13.0–17.0)
Lymphocytes Relative: 34.5 % (ref 12.0–46.0)
Lymphs Abs: 2.8 10*3/uL (ref 0.7–4.0)
MCHC: 33.8 g/dL (ref 30.0–36.0)
MCV: 91 fL (ref 78.0–100.0)
Monocytes Absolute: 0.7 10*3/uL (ref 0.1–1.0)
Monocytes Relative: 9 % (ref 3.0–12.0)
Neutro Abs: 4.3 10*3/uL (ref 1.4–7.7)
Neutrophils Relative %: 52.7 % (ref 43.0–77.0)
Platelets: 271 10*3/uL (ref 150.0–400.0)
RBC: 5.64 Mil/uL (ref 4.22–5.81)
RDW: 13 % (ref 11.5–15.5)
WBC: 8.2 10*3/uL (ref 4.0–10.5)

## 2023-09-16 LAB — HEPATIC FUNCTION PANEL
ALT: 39 U/L (ref 0–53)
AST: 28 U/L (ref 0–37)
Albumin: 4.9 g/dL (ref 3.5–5.2)
Alkaline Phosphatase: 74 U/L (ref 39–117)
Bilirubin, Direct: 0.1 mg/dL (ref 0.0–0.3)
Total Bilirubin: 0.7 mg/dL (ref 0.2–1.2)
Total Protein: 7.2 g/dL (ref 6.0–8.3)

## 2023-09-16 LAB — MICROALBUMIN / CREATININE URINE RATIO
Creatinine,U: 67.1 mg/dL
Microalb Creat Ratio: 1.1 mg/g (ref 0.0–30.0)
Microalb, Ur: 0.7 mg/dL (ref 0.0–1.9)

## 2023-09-16 LAB — LIPID PANEL
Cholesterol: 169 mg/dL (ref 0–200)
HDL: 42.5 mg/dL (ref >39.00)
LDL Cholesterol: 70 mg/dL (ref 0–99)
NonHDL: 126.97
Total CHOL/HDL Ratio: 4
Triglycerides: 285 mg/dL — ABNORMAL HIGH (ref 0.0–149.0)
VLDL: 57 mg/dL — ABNORMAL HIGH (ref 0.0–40.0)

## 2023-09-16 LAB — BASIC METABOLIC PANEL
BUN: 17 mg/dL (ref 6–23)
CO2: 28 meq/L (ref 19–32)
Calcium: 10 mg/dL (ref 8.4–10.5)
Chloride: 101 meq/L (ref 96–112)
Creatinine, Ser: 1.21 mg/dL (ref 0.40–1.50)
GFR: 68.32 mL/min (ref >60.00)
Glucose, Bld: 131 mg/dL — ABNORMAL HIGH (ref 70–99)
Potassium: 4.2 meq/L (ref 3.5–5.1)
Sodium: 138 meq/L (ref 135–145)

## 2023-09-16 LAB — TSH: TSH: 1.8 u[IU]/mL (ref 0.35–5.50)

## 2023-09-16 NOTE — Progress Notes (Signed)
   Subjective:    Patient ID: Frederick Abbott, male    DOB: 12/20/1970, 52 y.o.   MRN: [id number]  HPI Hyperlipidemia- chronic problem, on Lipitor 20mg  daily and Fenofibrate 160mg  daily.  Denies abd pain, N/V.  HTN- chronic problem, on Lisinopril 10mg  daily w/ good control.  Denies CP, SOB, HA's, visual changes, edema  DM- chronic problem, following w/ Dennie Maizes.  Due for microalbumin.   Review of Systems For ROS see HPI     Objective:   Physical Exam Vitals reviewed.  Constitutional:      General: He is not in acute distress.    Appearance: Normal appearance. He is well-developed. He is not ill-appearing.  HENT:     Head: Normocephalic and atraumatic.  Eyes:     Extraocular Movements: Extraocular movements intact.     Conjunctiva/sclera: Conjunctivae normal.     Pupils: Pupils are equal, round, and reactive to light.  Neck:     Thyroid: No thyromegaly.  Cardiovascular:     Rate and Rhythm: Normal rate and regular rhythm.     Pulses: Normal pulses.     Heart sounds: Normal heart sounds. No murmur heard. Pulmonary:     Effort: Pulmonary effort is normal. No respiratory distress.     Breath sounds: Normal breath sounds.  Abdominal:     General: Bowel sounds are normal. There is no distension.     Palpations: Abdomen is soft.  Musculoskeletal:     Cervical back: Normal range of motion and neck supple.     Right lower leg: No edema.     Left lower leg: No edema.  Lymphadenopathy:     Cervical: No cervical adenopathy.  Skin:    General: Skin is warm and dry.  Neurological:     General: No focal deficit present.     Mental Status: He is alert and oriented to person, place, and time.     Cranial Nerves: No cranial nerve deficit.  Psychiatric:        Mood and Affect: Mood normal.        Behavior: Behavior normal.           Assessment & Plan:

## 2023-09-16 NOTE — Assessment & Plan Note (Signed)
Chronic problem.  Currently on Lipitor and Fenofibrate w/o difficulty.  Check labs.  Adjust meds prn

## 2023-09-16 NOTE — Assessment & Plan Note (Signed)
Chronic problem.  Well controlled on Lisinopril 10mg  daily.  Currently asymptomatic.  Check labs due to ACE use but no anticipated med changes.

## 2023-09-16 NOTE — Assessment & Plan Note (Signed)
Has established w/ new Endo provider- Dennie Maizes.  Due for urine microalbumin- ordered today.

## 2023-09-16 NOTE — Patient Instructions (Addendum)
Schedule your complete physical in 6 months We'll notify you of your lab results and make any changes if needed Keep up the good work on healthy diet and regular exercise- you look great! Call with any questions or concerns Stay Safe!  Stay Healthy! Hang in there!

## 2023-09-19 ENCOUNTER — Telehealth: Payer: Self-pay

## 2023-09-19 NOTE — Telephone Encounter (Signed)
Patient aware of labs no questions at this time.

## 2023-09-19 NOTE — Telephone Encounter (Signed)
-----   Message from Neena Rhymes sent at 09/19/2023  7:27 AM EST ----- Labs are stable and look good!  No med changes at this time

## 2023-09-22 ENCOUNTER — Ambulatory Visit: Payer: No Typology Code available for payment source | Admitting: Registered Nurse

## 2023-09-22 ENCOUNTER — Encounter: Payer: Self-pay | Admitting: Registered Nurse

## 2023-09-22 VITALS — BP 131/92 | HR 88 | Temp 97.2°F | Resp 16

## 2023-09-22 DIAGNOSIS — R002 Palpitations: Secondary | ICD-10-CM

## 2023-09-22 NOTE — Progress Notes (Signed)
Subjective:    Patient ID: Frederick Abbott, male    DOB: 15-Nov-1970, 52 y.o.   MRN: [id number]  52y/o caucasian established diabetic male last night had racing heart rate and skipped beats resolved while at work today. Fatigued today at work. Felt some nausea but didn't throw up. A little sweaty and feeling heavy on his chest yesterday. Denied diarrhea/upset stomach.  Skipped breakfast this am typically doesn't eat breakfast but does drink coffee has cut back on his coffee over the past year and not drinking coffee up to bedtime typically any longer.  Blood sugars have been running a little higher than usual 190s versus 160s.  Denied known illness/sick contacts, dyspnea, chest pain, jaw/arm pain, visual changes, dizziness, rash, sore throat, body aches, headache, fever/ chills.  Held caffeine this morning.  Typically doesn't drink alcohol but did have drink last night vodka mixed  Mother with history of atrial fibrillation.  These are not his usual high or low blood sugar symptoms.  Patient is wearing his CGM and checking his sugar levels periodically.  Patient reports year round post nasal drip denied nosebleed/rhinitis.      Review of Systems  Constitutional:  Positive for diaphoresis and fatigue. Negative for chills and fever.  HENT:  Positive for congestion and postnasal drip. Negative for dental problem, ear discharge, ear pain, nosebleeds, rhinorrhea, sinus pressure, sinus pain, sneezing, sore throat, tinnitus, trouble swallowing and voice change.   Eyes:  Negative for photophobia and visual disturbance.  Respiratory:  Negative for cough, choking, shortness of breath, wheezing and stridor.   Cardiovascular:  Positive for palpitations. Negative for leg swelling.  Gastrointestinal:  Positive for nausea. Negative for diarrhea and vomiting.  Genitourinary:  Negative for difficulty urinating.  Musculoskeletal:  Negative for back pain, gait problem, joint swelling, myalgias, neck pain and neck  stiffness.  Allergic/Immunologic: Positive for environmental allergies.  Neurological:  Negative for dizziness, tremors, seizures, syncope, facial asymmetry, speech difficulty, weakness, light-headedness, numbness and headaches.  Hematological:  Negative for adenopathy. Does not bruise/bleed easily.  Psychiatric/Behavioral:  Negative for agitation, confusion and sleep disturbance.        Objective:   Physical Exam Vitals and nursing note reviewed.  Constitutional:      General: He is awake. He is not in acute distress.    Appearance: Normal appearance. He is well-developed, well-groomed and normal weight. He is not ill-appearing, toxic-appearing or diaphoretic.  HENT:     Head: Normocephalic and atraumatic.     Jaw: There is normal jaw occlusion.     Salivary Glands: Right salivary gland is not diffusely enlarged or tender. Left salivary gland is not diffusely enlarged or tender.     Right Ear: Hearing, ear canal and external ear normal. No decreased hearing noted. No laceration, drainage, swelling or tenderness. A middle ear effusion is present. There is no impacted cerumen. No foreign body. No mastoid tenderness. No PE tube. No hemotympanum. Tympanic membrane is not injected, scarred, perforated, erythematous, retracted or bulging.     Left Ear: Hearing, ear canal and external ear normal. No decreased hearing noted. No laceration, drainage, swelling or tenderness. A middle ear effusion is present. There is no impacted cerumen. No foreign body. No mastoid tenderness. No PE tube. No hemotympanum. Tympanic membrane is not injected, scarred, perforated, erythematous, retracted or bulging.     Ears:     Comments: Bilateral TMs intact air fluid level clear; no debris bilateral auditory canals    Nose: Nose normal. No congestion  or rhinorrhea.     Right Turbinates: Not enlarged, swollen or pale.     Left Turbinates: Not enlarged, swollen or pale.     Right Sinus: No maxillary sinus tenderness or  frontal sinus tenderness.     Left Sinus: No maxillary sinus tenderness or frontal sinus tenderness.     Mouth/Throat:     Lips: Pink. No lesions.     Mouth: Mucous membranes are moist. No oral lesions or angioedema.     Dentition: No gum lesions.     Tongue: No lesions. Tongue does not deviate from midline.     Palate: No mass and lesions.     Pharynx: Oropharynx is clear. Uvula midline. Postnasal drip present. No pharyngeal swelling, oropharyngeal exudate, posterior oropharyngeal erythema or uvula swelling.     Tonsils: No tonsillar exudate or tonsillar abscesses.     Comments: Cobblestoning posterior pharynx; bilateral allergic shiners;  Eyes:     General: Lids are normal. Vision grossly intact. Gaze aligned appropriately. Allergic shiner present. No scleral icterus.       Right eye: No discharge.        Left eye: No discharge.     Extraocular Movements: Extraocular movements intact.     Right eye: Normal extraocular motion and no nystagmus.     Left eye: Normal extraocular motion and no nystagmus.     Conjunctiva/sclera: Conjunctivae normal.     Right eye: Right conjunctiva is not injected. No chemosis, exudate or hemorrhage.    Left eye: Left conjunctiva is not injected. No chemosis, exudate or hemorrhage.    Pupils: Pupils are equal, round, and reactive to light.  Neck:     Trachea: Trachea and phonation normal. No abnormal tracheal secretions.  Cardiovascular:     Rate and Rhythm: Normal rate and regular rhythm.     Pulses: Normal pulses.          Radial pulses are 2+ on the right side and 2+ on the left side.     Heart sounds: Normal heart sounds, S1 normal and S2 normal. Heart sounds not distant. No murmur heard.    Comments: Apical pulse and radial pulse x 60 seconds regular rate 70-80s Pulmonary:     Effort: Pulmonary effort is normal. No respiratory distress.     Breath sounds: Normal breath sounds and air entry. No stridor, decreased air movement or transmitted upper  airway sounds. No decreased breath sounds, wheezing, rhonchi or rales.     Comments: Spoke full sentences without difficulty; no cough observed in exam room Abdominal:     General: Abdomen is flat. There is no distension.     Palpations: Abdomen is soft.     Tenderness: There is no guarding.  Musculoskeletal:        General: Normal range of motion.     Right forearm: No swelling, edema, deformity or lacerations.     Left forearm: No swelling, edema, deformity or lacerations.     Right hand: Normal strength. Normal capillary refill.     Left hand: Normal strength. Normal capillary refill.     Cervical back: Normal range of motion and neck supple. No swelling, edema, deformity, erythema, signs of trauma, lacerations, rigidity, spasms, torticollis, tenderness or crepitus. No pain with movement. Normal range of motion.     Thoracic back: No swelling, edema, deformity, signs of trauma, lacerations, spasms or tenderness. Normal range of motion.     Right lower leg: No edema.     Left lower leg:  No edema.  Lymphadenopathy:     Head:     Right side of head: No submental, submandibular, tonsillar, preauricular, posterior auricular or occipital adenopathy.     Left side of head: No submental, submandibular, tonsillar, preauricular, posterior auricular or occipital adenopathy.     Cervical: No cervical adenopathy.     Right cervical: No superficial, deep or posterior cervical adenopathy.    Left cervical: No superficial, deep or posterior cervical adenopathy.  Skin:    General: Skin is warm and dry.     Capillary Refill: Capillary refill takes less than 2 seconds.     Coloration: Skin is not ashen, cyanotic, jaundiced, mottled, pale or sallow.     Findings: No abrasion, abscess, acne, bruising, burn, ecchymosis, erythema, signs of injury, laceration, lesion, petechiae, rash or wound.     Nails: There is no clubbing.     Comments: Face/neck/arms visually inspected  Neurological:     General: No  focal deficit present.     Mental Status: He is alert and oriented to person, place, and time. Mental status is at baseline.     GCS: GCS eye subscore is 4. GCS verbal subscore is 5. GCS motor subscore is 6.     Cranial Nerves: No cranial nerve deficit, dysarthria or facial asymmetry.     Motor: Motor function is intact. No weakness, tremor, atrophy, abnormal muscle tone or seizure activity.     Coordination: Coordination is intact. Coordination normal.     Gait: Gait is intact. Gait normal.     Comments: In/out of chair and on/off exam table without difficulty; gait sure and steady in clinic; bilateral hand grasp equal 5/5  Psychiatric:        Attention and Perception: Attention and perception normal.        Mood and Affect: Mood and affect normal.        Speech: Speech normal.        Behavior: Behavior normal. Behavior is cooperative.        Thought Content: Thought content normal.        Cognition and Memory: Cognition and memory normal.        Judgment: Judgment normal.       Latest Reference Range & Units 05/18/23 08:22 09/16/23 08:43  Sodium 135 - 145 mEq/L  138  Potassium 3.5 - 5.1 mEq/L  4.2  Chloride 96 - 112 mEq/L  101  CO2 19 - 32 mEq/L  28  Glucose 70 - 99 mg/dL  595 (H)  BUN 6 - 23 mg/dL  17  Creatinine 6.38 - 1.50 mg/dL  7.56  Calcium 8.4 - 43.3 mg/dL  29.5  Magnesium 1.6 - 2.3 mg/dL 2.1   Alkaline Phosphatase 39 - 117 U/L  74  Albumin 3.5 - 5.2 g/dL  4.9  AST 0 - 37 U/L  28  ALT 0 - 53 U/L  39  Total Protein 6.0 - 8.3 g/dL  7.2  Bilirubin, Direct 0.0 - 0.3 mg/dL  0.1  Total Bilirubin 0.2 - 1.2 mg/dL  0.7  GFR >18.84 mL/min  68.32  Total CHOL/HDL Ratio   4  Cholesterol 0 - 200 mg/dL  166  HDL Cholesterol >06.30 mg/dL  16.01  LDL (calc) 0 - 99 mg/dL  70  MICROALB/CREAT RATIO 0.0 - 30.0 mg/g  1.1  NonHDL   126.97  Triglycerides 0.0 - 149.0 mg/dL  093.2 (H)  VLDL 0.0 - 40.0 mg/dL  35.5 (H)  Vitamin D, 73-UKGURKY 30.0 - 100.0  ng/mL 41.5   WBC 4.0 - 10.5 K/uL   8.2  RBC 4.22 - 5.81 Mil/uL  5.64  Hemoglobin 13.0 - 17.0 g/dL  40.9 (H)  HCT 81.1 - 91.4 %  51.3  MCV 78.0 - 100.0 fl  91.0  MCHC 30.0 - 36.0 g/dL  78.2  RDW 95.6 - 21.3 %  13.0  Platelets 150.0 - 400.0 K/uL  271.0  Neutrophils 43.0 - 77.0 %  52.7  Lymphocytes 12.0 - 46.0 %  34.5  Monocytes Relative 3.0 - 12.0 %  9.0  Eosinophil 0.0 - 5.0 %  3.1  Basophil 0.0 - 3.0 %  0.7  NEUT# 1.4 - 7.7 K/uL  4.3  Lymphs Abs 0.7 - 4.0 K/uL  2.8  Monocyte # 0.1 - 1.0 K/uL  0.7  Eosinophils Absolute 0.0 - 0.7 K/uL  0.3  Basophils Absolute 0.0 - 0.1 K/uL  0.1  Hemoglobin A1C 4.8 - 5.6 % 7.6 (H)   Est. average glucose Bld gHb Est-mCnc mg/dL 086   TSH 5.78 - 4.69 uIU/mL  1.80  Creatinine,U mg/dL  62.9  Microalb, Ur 0.0 - 1.9 mg/dL  0.7  (H): Data is abnormally high 1410 spoke with patient in workcenter stated has not had reoccurrence palpations today at work feeling fatigued but denied new symptoms.  Discussed seek same day re-evaluation with provider if palpitations start again and have EKG performed. ER/911 if syncope, left arm/jaw pain, shortness of breath and worsening with rest. Avoid dehydration/skipping meals/more than 1 serving caffeine per day/avoid alcohol intake.  Continue to monitor blood sugars and use sliding scale or carbohydrate intake as indicated if high or low.  Patient A&Ox3 skin warm dry and pink respirations even and unlabored spoke full sentences without difficulty gait sure and steady  Patient agreed with plan of care and had no further questions at that time.    Assessment & Plan:  A-palpitations  P-Discussed with patient VSS, normal PE at this time.  If palpitations recurrs avoid alcohol, caffeine, skipping meals or high sugar intake as can worsen palpitations.  Notify me today if starts again while at work and will re-evaluate when I am onsite today.  Follow up/notify PCM if recurs consider EKG (not available in this clinic)/zio monitor.  Recent labs 09/16/23  electrolytes/thyroid/kidney/liver/function and CBC essentially all normal. Patient denied any GI vomiting/diarrhea.  Eating typical po intake without difficulty.  Mother with history of atrial fibrillation.  DDx hyper/hypoglycemia, electrolyte imbalance, atrial fibrillation, adverse effect caffeine/alcohol  Exitcare handout on palpitations.  Discussed chronic alcohol intake and caffeine intake greater than 200mg  can result in palpitations/irregular heart rate.  Patient denied panic attack.  Discussed chronic daily alcohol intake has been known to increase risk for atrial fibrillation.  Patient verbalized understanding information/instructions, agreed with plan of care and had no further questions at this time.

## 2023-09-22 NOTE — Patient Instructions (Signed)
Palpitations Palpitations are feelings that your heartbeat is irregular or is faster than normal. It may feel like your heart is fluttering or skipping a beat. Palpitations may be caused by many things, including smoking, caffeine, alcohol, stress, and certain medicines or drugs. Most causes of palpitations are not serious.  However, some palpitations can be a sign of a serious problem. Further tests and a thorough medical history will be done to find the cause of your palpitations. Your provider may order tests such as an ECG, labs, an echocardiogram, or an ambulatory continuous ECG monitor. Follow these instructions at home: Pay attention to any changes in your symptoms. Let your health care provider know about them. Take these actions to help manage your symptoms: Eating and drinking Follow instructions from your health care provider about eating or drinking restrictions. You may need to avoid foods and drinks that may cause palpitations. These may include: Caffeinated coffee, tea, soft drinks, and energy drinks. Chocolate. Alcohol. Diet pills. Lifestyle     Take steps to reduce your stress and anxiety. Things that can help you relax include: Yoga. Mind-body activities, such as deep breathing, meditation, or using words and images to create positive thoughts (guided imagery). Physical activity, such as swimming, jogging, or walking. Tell your health care provider if your palpitations increase with activity. If you have chest pain or shortness of breath with activity, do not continue the activity until you are seen by your health care provider. Biofeedback. This is a method that helps you learn to use your mind to control things in your body, such as your heartbeat. Get plenty of rest and sleep. Keep a regular bed time. Do not use drugs, including cocaine or ecstasy. Do not use marijuana. Do not use any products that contain nicotine or tobacco. These products include cigarettes, chewing  tobacco, and vaping devices, such as e-cigarettes. If you need help quitting, ask your health care provider. General instructions Take over-the-counter and prescription medicines only as told by your health care provider. Keep all follow-up visits. This is important. These may include visits for further testing if palpitations do not go away or get worse. Contact a health care provider if: You continue to have a fast or irregular heartbeat for a long period of time. You notice that your palpitations occur more often. Get help right away if: You have chest pain or shortness of breath. You have a severe headache. You feel dizzy or you faint. These symptoms may represent a serious problem that is an emergency. Do not wait to see if the symptoms will go away. Get medical help right away. Call your local emergency services (911 in the [place] Do not drive yourself to the hospital. Summary Palpitations are feelings that your heartbeat is irregular or is faster than normal. It may feel like your heart is fluttering or skipping a beat. Palpitations may be caused by many things, including smoking, caffeine, alcohol, stress, certain medicines, and drugs. Further tests and a thorough medical history may be done to find the cause of your palpitations. Get help right away if you faint or have chest pain, shortness of breath, severe headache, or dizziness. This information is not intended to replace advice given to you by your health care provider. Make sure you discuss any questions you have with your health care provider. Document Revised: 03/18/2021 Document Reviewed: 03/18/2021 Elsevier Patient Education  2024 [employer].

## 2023-10-21 ENCOUNTER — Other Ambulatory Visit: Payer: Self-pay | Admitting: Family Medicine

## 2023-10-27 ENCOUNTER — Encounter: Payer: Self-pay | Admitting: Family Medicine

## 2023-10-27 LAB — HM DIABETES EYE EXAM

## 2023-11-12 ENCOUNTER — Other Ambulatory Visit: Payer: Self-pay | Admitting: Family Medicine

## 2023-11-15 ENCOUNTER — Telehealth: Payer: Self-pay | Admitting: Registered Nurse

## 2023-11-15 ENCOUNTER — Encounter: Payer: Self-pay | Admitting: Registered Nurse

## 2023-11-15 DIAGNOSIS — E781 Pure hyperglyceridemia: Secondary | ICD-10-CM

## 2023-11-15 DIAGNOSIS — I10 Essential (primary) hypertension: Secondary | ICD-10-CM

## 2023-11-15 NOTE — Telephone Encounter (Signed)
 Patient last filled 180 day supply lisinopril  10mg  sig t1/2 po daily #90 and atorvastatin  20mg  sig t1/2 po daily #90 05/19/2023 given 90 tabs today from [facility].     Latest Reference Range & Units 03/11/23 09:28 05/18/23 08:22   Sodium 135 - 145 mEq/L 138    Potassium 3.5 - 5.1 mEq/L 4.3    Chloride 96 - 112 mEq/L 102    CO2 19 - 32 mEq/L 26    Glucose 70 - 99 mg/dL 851 (H)    BUN 6 - 23 mg/dL 18    Creatinine 9.59 - 1.50 mg/dL 8.68    Calcium  8.4 - 10.5 mg/dL 9.3    Magnesium 1.6 - 2.3 mg/dL   2.1  Alkaline Phosphatase 39 - 117 U/L 86    Albumin 3.5 - 5.2 g/dL 4.7    AST 0 - 37 U/L 35    ALT 0 - 53 U/L 42    Total Protein 6.0 - 8.3 g/dL 7.1    Bilirubin, Direct 0.0 - 0.3 mg/dL 0.1    Total Bilirubin 0.2 - 1.2 mg/dL 0.5    GFR >39.99 mL/min 62.33    Total CHOL/HDL Ratio   3    Cholesterol 0 - 200 mg/dL 857    HDL Cholesterol >39.00 mg/dL 59.39    Direct LDL mg/dL 14.9    NonHDL   899.06    Triglycerides 0.0 - 149.0 mg/dL 759.9 (H)    VLDL 0.0 - 40.0 mg/dL 51.9 (H)    Vitamin D , 25-Hydroxy 30.0 - 100.0 ng/mL   41.5  WBC 4.0 - 10.5 K/uL 6.9    RBC 4.22 - 5.81 Mil/uL 5.62    Hemoglobin 13.0 - 17.0 g/dL 82.9    HCT 60.9 - 47.9 % 50.4    MCV 78.0 - 100.0 fl 89.6    MCHC 30.0 - 36.0 g/dL 66.1    RDW 88.4 - 84.4 % 13.1    Platelets 150.0 - 400.0 K/uL 269.0    Neutrophils 43.0 - 77.0 % 46.4    Lymphocytes 12.0 - 46.0 % 35.0    Monocytes Relative 3.0 - 12.0 % 13.9 (H)    Eosinophil 0.0 - 5.0 % 4.2    Basophil 0.0 - 3.0 % 0.5    NEUT# 1.4 - 7.7 K/uL 3.2    Lymphocyte # 0.7 - 4.0 K/uL 2.4    Monocyte # 0.1 - 1.0 K/uL 1.0    Eosinophils Absolute 0.0 - 0.7 K/uL 0.3    Basophils Absolute 0.0 - 0.1 K/uL 0.0    Hemoglobin A1C 4.8 - 5.6 %   7.6 (H)  Est. average glucose Bld gHb Est-mCnc mg/dL   828  TSH 9.64 - 4.49 uIU/mL 2.62   Last PCM visit 09/16/23 and labs stable no med changes

## 2023-11-22 ENCOUNTER — Encounter: Payer: Self-pay | Admitting: Family Medicine

## 2023-11-23 ENCOUNTER — Telehealth: Payer: Self-pay | Admitting: Registered Nurse

## 2023-11-23 ENCOUNTER — Encounter: Payer: Self-pay | Admitting: Registered Nurse

## 2023-11-23 DIAGNOSIS — U071 COVID-19: Secondary | ICD-10-CM

## 2023-11-23 MED ORDER — MOLNUPIRAVIR EUA 200MG CAPSULE: 4.0000 | ORAL_CAPSULE | Freq: Two times a day (BID) | ORAL | 0 refills | Status: AC

## 2023-11-23 NOTE — Telephone Encounter (Signed)
Notified by supervisor patient tested positive for covid stayed home to quarantine today.  Contacted patient via telephone symptoms started this weekend congestion/rhinitis.  Blood sugars still running his usual.  Tolerating po intake without difficulty  Denied known sick contacts.  Denied close contacts in previous 48hours at work e.g. no mask greater than 15 minutes within 6 feet face to face contact.  Pt began quarantine at that time. Patient did not develop symptoms of  trouble breathing, chest pain, nausea, vomiting, diarrhea.  He did develop sore throat, HA, body aches, fever and chills.     quarantine per CDC until symptoms improving fever/vomiting/diarrhea free x 24 hours then may return to work with mask and no eating in lunch room x 10 days through 12/03/23. Day 1 of quarantine was 11/23/23. Patient to contact clinic staff if vomiting after coughing or unable to tolerate po fluids.  Discussed flu and other viral illnesses circulating in community and some causing GI upset.  If GI upset I have recommended clear fluids then bland diet.  Avoid dairy/spicy, fried and large portions of meat while having nausea.  If vomiting hold po intake x 1 hour.  Then sips clear fluids like broths, ginger ale, power ade, gatorade, pedialyte may advance to soft/bland if no vomiting x 24 hours and appetite returned otherwise hydration main focus. Call me at work from home number if symptoms not improved with plan of care  patient to call if high fever, dehydration, marked weakness, fainting, increased abdominal pain, blood in stool or vomit (red or black).     Reviewed possible Covid symptoms including cough, shortness of breath with exertion or at rest, runny nose, congestion, sinus pain/pressure, sore throat, fever/chills, body aches, fatigue, loss of taste/smell, GI symptoms of nausea/vomiting/diarrhea. Also reviewed same day/emergent eval/ER precautions of dizziness/syncope, confusion, blue tint to lips/face, severe  shortness of breath/difficulty breathing/wheezing.    Patient to isolate in own room and if possible use only one bathroom if living with others in home.  Wear mask when out of room to help prevent spread to others in household.  Sanitize high touch surfaces with lysol/chlorox/bleach spray or wipes daily as viruses are known to live on surfaces from 24 hours to days.  Patient does want antivirals.  Patient at higher risk for hospitalization due to autoimmune disease, hypertension, diabetes Patient is up to date on covid vaccines.  Patient is on prescription medications or daily medications. If taking medications epocrates interaction checker used to verify if any drug interactions. taking OTC cough/cold/fever medication at this time dayquil/nyquil/honey.  Patient molnupiravir emergency use handout sent to patient electronically along with covid quarantine exitcare handout in my chart.  Discussed how to take molnupiravir 200mg  take 4 tabs by mouth every 12 hours x 5 days. Discussed lemonade can sometimes help with metallic/plastic taste in mouth (side effect medication).  Discussed most common side effects GI upset and bad taste in mouth.  Use birth control/avoid fathering children while on molnupiravir.  Discussed I recommended not having sex with anyone while sick/testing positive/10 day quarantine as could spread virus to partner.  Discussed with patient I would call again tomorrow to follow up symptoms/see if questions/concerns.      May use salt water gargles and nasal saline 2 sprays each nostril q2h prn congestion/sore throat.  Research has shown it helps to prevent hospitalizations and decrease discomfort.   May use flonase nasal 1 spray each nostril BID prn rhinitis.  Dayquil and nyquil per manufacturer instructions.  Discussed honey 1 tablespoon every 4 hours is a natural cough suppressant but caution due to his diabetes.  Avoid dehydration and drink water to keep urine pale yellow clear and  voiding every 2-4 hours while awake.  Patient alert and oriented x3, spoke full sentences without difficulty.  Some nasal congestion/hroat clearing/audible during telephone call.  No hoarse voice/wheezing/shortness of breath during 6 minute telephone call.  Discussed with patient can contact NP Inetta Fermo through my chart/[phone] when clinic closed if questions or concerns until RN returns Wed/Th 8a-5p this week and M 8-5 and Wed 8-5 next week [phone].   Pt verbalized understanding and agreement with plan of care. No further questions/concerns at this time. Pt reminded to contact clinic with any changes in symptoms or questions/concerns. HR notified patient to work remote/quarantine through until cleared to RTW with strict mask wear through Day 10 and no eating in employee lunch room. HR and Supervisor notified of excused absence 1/15-16/25

## 2023-11-24 NOTE — Telephone Encounter (Signed)
Patient stated feeling better just has congestion/runny nose ready to return to work tomorrow.  Denied fever/chills/n/v/d.  HR and supervisor notified patient cleared to return onsite tomorrow with mask wear and no eating in employee lunch room.  Patient A&Ox3 spoke full sentences without difficulty no audible cough/congestion/throat clearing/wheezing during 2 minute call.  Patient agreed with plan of care and had no further questions at this time.

## 2023-11-25 ENCOUNTER — Telehealth: Payer: Self-pay | Admitting: Registered Nurse

## 2023-11-25 DIAGNOSIS — T148XXA Other injury of unspecified body region, initial encounter: Secondary | ICD-10-CM

## 2023-11-25 MED ORDER — TRIPLE ANTIBIOTIC 5-400-5000 EX OINT: TOPICAL_OINTMENT | Freq: Two times a day (BID) | CUTANEOUS | Status: AC

## 2023-11-25 MED ORDER — CEPHALEXIN 500 MG PO CAPS: 500.0000 mg | ORAL_CAPSULE | Freq: Two times a day (BID) | ORAL | 0 refills | Status: AC

## 2023-11-25 NOTE — Telephone Encounter (Signed)
Patient returned to work as expected 11/25/23; see tcon dated today as splinter in hand at work

## 2023-11-29 NOTE — Telephone Encounter (Signed)
Patient seen in workcenter hand skin dry no foreign body observed given aquaphor ointment to apply to dry skin BID prn and will recheck area of concern later today after emollient allowed to absorb.  No erythema/discharge/nodule/pain.  Patient A&Ox3 spoke full sentences without difficulty bilateral hand grasp equal strength 5/5  RR 16 sp02 RA 98% no cough/congestion observed and wearing disposable surgical mask gait sure and steady in workcenter

## 2023-12-07 NOTE — Telephone Encounter (Signed)
Patient stated all pain resolved and no rash/feeling of foreign body in hand.  Denied further needs or concerns at this time.  Did not take antibiotic.  Skin warm dry and pink bilateral hands.  Encouraged patient to continue apply emollient to dry skin daily.  Patient verbalized understanding and had no further questions at that time.

## 2023-12-07 NOTE — Telephone Encounter (Signed)
Patient seen in workcenter 12/06/23 respirations even and unlabored RA no audible congestion/throat clearing/rhinitis/cough observed.  Skin warm dry and pink gait sure and steady spoke full sentences without difficulty.

## 2023-12-29 ENCOUNTER — Telehealth: Payer: Self-pay | Admitting: Registered Nurse

## 2023-12-29 ENCOUNTER — Encounter: Payer: Self-pay | Admitting: Registered Nurse

## 2023-12-29 DIAGNOSIS — H6993 Unspecified Eustachian tube disorder, bilateral: Secondary | ICD-10-CM

## 2023-12-29 DIAGNOSIS — J3089 Other allergic rhinitis: Secondary | ICD-10-CM

## 2023-12-29 MED ORDER — SALINE SPRAY 0.65 % NA SOLN: 2.0000 | NASAL | Status: DC

## 2023-12-29 MED ORDER — FLUTICASONE PROPIONATE 50 MCG/ACT NA SUSP: 1.0000 | Freq: Two times a day (BID) | NASAL | 1 refills | Status: DC

## 2023-12-29 NOTE — Telephone Encounter (Signed)
Patient stated ENT appt today cancelled provider unable to work clinic and he would like check of his ears as decreased hearing concern he has wax buildup.  Sometimes sounds like he has water in his ears also.  Denied drainage, pain, bleeding, fever or chills.  Frequent throat clearing.  Taking his antihistamine daily and when he remembers flonase am and nasal saline prn.  Bilateral TMs intact air fluid level clear no debris noted in auditory canals frequent throat clearing in clinic cobblestoning posterior pharynx; bilateral allergic shiners; nasal turbinates edema erythema clear discharge.  Discussed with patient use nasal saline first in am 2 sprays each nostril may use q2h prn congestion.  Sniff until feel water going down back of throat.  Wait 5 minutes blow nose if mucous and congestion cleared then use fluticasone nasal 1 spray each nostril BID  #16g RF1 electronic Rx sent to his pharmacy of choice.  This is very light inhale want medicine to stay in nostrils/sinuses not get pulled into throat.  Continue antihistamine po daily.  Consider singulair 10mg  po at bedtime Rx if no relief with plan of care.  Exitcare handouts on nonallergic rhinitis and allergic rhinitis sent to patient my chart.  Discussed no cerumen impaction. No evidence of invasive bacterial infection, non toxic and well hydrated.  I do not see where any further testing or imaging is necessary at this time.   I will suggest supportive care, rest, good hygiene and encourage the patient to take adequate fluids.  The patient is to return to clinic or EMERGENCY ROOM if symptoms worsen or change significantly e.g. ear pain, fever, purulent discharge from ears or bleeding.  Exitcare handout on eustachian tube dysfunction sent to my chart.  Discussed with patient post nasal drip irritates throat/causes swelling blocks eustachian tubes from draining and fluid fills up middle ear.  Bacteria/viruses can grow in fluid and with moving head tube  compressed and increases pressure in tube/ear worsening pain.  Studies show will take 30 days for fluid to resolve after post nasal drip controlled with nasal steroid/antihistamine. Antibiotics and steroids do not speed up fluid removal.  Patient verbalized agreement and understanding of treatment plan and had no further questions at this time.

## 2024-01-30 ENCOUNTER — Other Ambulatory Visit: Payer: Self-pay | Admitting: Registered Nurse

## 2024-01-30 DIAGNOSIS — J301 Allergic rhinitis due to pollen: Secondary | ICD-10-CM

## 2024-02-01 ENCOUNTER — Encounter: Payer: Self-pay | Admitting: Registered Nurse

## 2024-02-01 NOTE — Telephone Encounter (Signed)
 Patient stated he uses fluticasone nasal 1 spray each nostril BID prn allergic rhinitis/colds.  Sometimes buys at [facility] and other times fills at his pharmacy.  Discussed with patient see which way is cheaper per bottle to buy.  New rx sent to his pharmacy of choice per his request to check price difference. #58 RF3  Patient verbalized understanding information and had no further questions at this time.

## 2024-02-08 ENCOUNTER — Other Ambulatory Visit: Payer: Self-pay | Admitting: Family Medicine

## 2024-02-18 ENCOUNTER — Other Ambulatory Visit: Payer: Self-pay | Admitting: Family Medicine

## 2024-03-03 ENCOUNTER — Other Ambulatory Visit: Payer: Self-pay | Admitting: Family Medicine

## 2024-03-07 ENCOUNTER — Other Ambulatory Visit: Payer: Self-pay | Admitting: Registered Nurse

## 2024-03-07 DIAGNOSIS — E1165 Type 2 diabetes mellitus with hyperglycemia: Secondary | ICD-10-CM

## 2024-03-07 DIAGNOSIS — E1069 Type 1 diabetes mellitus with other specified complication: Secondary | ICD-10-CM

## 2024-03-07 NOTE — Progress Notes (Signed)
 Patient reported he did finally get mounjaro approved and started and he has noticed a big difference in decreased snacking/food cravings especially sugar and his continuous glucose monitor has been showing improvement from blood sugars greater than 200 now 160s average.  Patient showed NP app on his phone with blood sugar readings for the past 2 weeks.  Patient had in body scan completed with RN Thersia Flax 03/06/24  Encouraged patient to continue his healthy dietary choices and follow up with PCM who is managing his mouinjaro.  Patient agreed with plan of care and had no further questions at that time.  A&Ox3 gait sure and stead in clinic skin warm dry and pink respirations even and unlabored RA

## 2024-03-29 ENCOUNTER — Other Ambulatory Visit

## 2024-03-29 VITALS — BP 112/78 | Ht 70.5 in | Wt 154.0 lb

## 2024-03-29 DIAGNOSIS — Z0000 Encounter for general adult medical examination without abnormal findings: Secondary | ICD-10-CM

## 2024-03-29 NOTE — Progress Notes (Signed)
 [facility]

## 2024-03-30 ENCOUNTER — Ambulatory Visit: Payer: Self-pay | Admitting: Registered Nurse

## 2024-03-30 LAB — CMP12+LP+TP+TSH+6AC+CBC/D/PLT
ALT: 30 IU/L (ref 0–44)
AST: 26 IU/L (ref 0–40)
Albumin: 4.9 g/dL (ref 3.8–4.9)
Alkaline Phosphatase: 62 IU/L (ref 44–121)
BUN/Creatinine Ratio: 14 (ref 9–20)
BUN: 17 mg/dL (ref 6–24)
Basophils Absolute: 0.1 10*3/uL (ref 0.0–0.2)
Basos: 1 %
Bilirubin Total: 0.8 mg/dL (ref 0.0–1.2)
Calcium: 9.6 mg/dL (ref 8.7–10.2)
Chloride: 102 mmol/L (ref 96–106)
Chol/HDL Ratio: 3.4 ratio (ref 0.0–5.0)
Cholesterol, Total: 154 mg/dL (ref 100–199)
Creatinine, Ser: 1.23 mg/dL (ref 0.76–1.27)
EOS (ABSOLUTE): 0.5 10*3/uL — ABNORMAL HIGH (ref 0.0–0.4)
Eos: 7 %
Estimated CHD Risk: 0.5 times avg. (ref 0.0–1.0)
Free Thyroxine Index: 2.1 (ref 1.2–4.9)
GGT: 31 IU/L (ref 0–65)
Globulin, Total: 2.1 g/dL (ref 1.5–4.5)
Glucose: 111 mg/dL — ABNORMAL HIGH (ref 70–99)
HDL: 45 mg/dL (ref >39)
Hematocrit: 50.1 % (ref 37.5–51.0)
Hemoglobin: 16.8 g/dL (ref 13.0–17.7)
Immature Grans (Abs): 0 10*3/uL (ref 0.0–0.1)
Immature Granulocytes: 0 %
Iron: 215 ug/dL — ABNORMAL HIGH (ref 38–169)
LDH: 111 IU/L — ABNORMAL LOW (ref 121–224)
LDL Chol Calc (NIH): 86 mg/dL (ref 0–99)
Lymphocytes Absolute: 2.7 10*3/uL (ref 0.7–3.1)
Lymphs: 36 %
MCH: 31.1 pg (ref 26.6–33.0)
MCHC: 33.5 g/dL (ref 31.5–35.7)
MCV: 93 fL (ref 79–97)
Monocytes Absolute: 0.7 10*3/uL (ref 0.1–0.9)
Monocytes: 10 %
Neutrophils Absolute: 3.4 10*3/uL (ref 1.4–7.0)
Neutrophils: 46 %
Phosphorus: 3.3 mg/dL (ref 2.8–4.1)
Platelets: 277 10*3/uL (ref 150–450)
Potassium: 4.5 mmol/L (ref 3.5–5.2)
RBC: 5.41 x10E6/uL (ref 4.14–5.80)
RDW: 13.1 % (ref 11.6–15.4)
Sodium: 140 mmol/L (ref 134–144)
T3 Uptake Ratio: 25 % (ref 24–39)
T4, Total: 8.2 ug/dL (ref 4.5–12.0)
TSH: 2.92 u[IU]/mL (ref 0.450–4.500)
Total Protein: 7 g/dL (ref 6.0–8.5)
Triglycerides: 132 mg/dL (ref 0–149)
Uric Acid: 4 mg/dL (ref 3.8–8.4)
VLDL Cholesterol Cal: 23 mg/dL (ref 5–40)
WBC: 7.3 10*3/uL (ref 3.4–10.8)
eGFR: 70 mL/min/{1.73_m2} (ref >59)

## 2024-03-30 LAB — HEMOGLOBIN A1C
Est. average glucose Bld gHb Est-mCnc: 151 mg/dL
Hgb A1c MFr Bld: 6.9 % — ABNORMAL HIGH (ref 4.8–5.6)

## 2024-03-30 NOTE — Progress Notes (Signed)
 My chart message sent to patient Frederick Abbott, Your iron is elevated worse new and eosinophils slightly elevated are you recently sick or allergy flare up?  Do you take any vitamins/eat a lot of meat/drink well or bottled water?  Hgba1c improved from 7.6 to 6.9 Congratulations!  Your hard work is showing.  I recommend routine follow up with PCM and repeat iron level in 3 to 6 months after dietary modification to reduce iron intake  Exitcare handout on iron rich foods, try to keep intake to one iron rich food per meal/snack.  Next lab nonfasting schedule with PCM or RN Thersia Flax  Chronically high iron levels can cause organ problems so I would like us  to figure out where you can cut back on your iron intake and repeat a level in 1-3 months nonfasting.   Are you taking a vitamin or supplement?  Do you drink bottled, well or city water?  It is recommended 8mg  iron in your diet per day.   Breads/cereals and pastas tend to be fortified with iron.  Meat and beans are most common sources of iron.    Amounts of iron in some foods: Canned clams: 3 ounces (oz) provides 26 milligrams (mg) of iron. Fortified, plain, dry cereal oats: 100 g provides 24.72 mg. White beans: One cup provides 21.09. Dark chocolate (45 to 69 percent cacao): One bar provides 12.99 mg. Cooked Pacific oysters: 3 oz provides 7.82 mg. Cooked spinach: One cup provides 6.43 mg. Beef liver: 3 oz provides 4.17 mg. Boiled and drained lentils: Half a cup provides 3.3 mg. Firm tofu: Half a cup provides 2.03 mg. Boiled and drained chickpeas: Half a cup provides 2.37 mg. Canned, stewed tomatoes: Half a cup provides 1.7 mg. Lean, ground beef: 3 oz provides 2.07 mg. Medium baked potato: This provides 1.87 mg. Roasted cashew nuts: 3 oz provides 2 mg.   Follow up with PCM elevated iron. I recommend exercise 150 minutes per week; dietary fiber 20 grams women per up to date; eat whole grains/fruits/vegetables; keep added sugars to less than 100 calories/ 5  teaspoons for women per American Heart Association; blood sugar, electrolytes, kidney/liver/thyroid  function, and complete blood count normal.  RN Fairy Homer can print handouts and results if you prefer.    Please let us  know if you have further questions. Sincerely, Sherilyn Dings NP-C   [facility] has free dietitian here is link to schedule appt [facility] ([email or web address]).  Avoid dehydration. Drink water to keep urine pale yellow clear and voiding every 2-4 hours while awake.  Activity after meals/snacks e.g. walk/bike/garden/house chores/weight/stretches.  Shoot for 25 grams protein per meal.   I recommend activity 150 minutes per week; dietary fiber 30 grams per day men; eat whole grains/fruits/vegetables; keep added sugars to less than 150 calories/9 teaspoons for men per American Heart Association; cholesterol, electrolytes, kidney and thyroid  function normal and no anemia on complete blood count  Please see RN Thersia Flax if you want printed copy of results or to send results to another provider.  Please let us  know if you have further questions or concerns.  Next labs  fasting per PCM/[facility] due again in 12 months. Please continue vitamin D  2,000 units daily with meal. Sincerely, Sherilyn Dings NP-C

## 2024-04-06 NOTE — Progress Notes (Signed)
 The 10-year ASCVD risk score (Arnett DK, et al., 2019) is: 7.2%   Values used to calculate the score:     Age: 53 years     Sex: Male     Is Non-Hispanic African American: No     Diabetic: Yes     Tobacco smoker: No     Systolic Blood Pressure: 112 mmHg     Is BP treated: Yes     HDL Cholesterol: 45 mg/dL     Total Cholesterol: 154 mg/dL  Currently prescribed atorvastatin  20 mg.  Briggett Tuccillo, BSN, RN

## 2024-04-09 ENCOUNTER — Other Ambulatory Visit: Payer: Self-pay

## 2024-04-09 ENCOUNTER — Other Ambulatory Visit (HOSPITAL_COMMUNITY)
Admission: RE | Admit: 2024-04-09 | Discharge: 2024-04-09 | Disposition: A | Discharge status: Home / Self Care | Source: Ambulatory Visit | Attending: Infectious Diseases | Admitting: Infectious Diseases

## 2024-04-09 ENCOUNTER — Encounter: Payer: Self-pay | Admitting: Infectious Diseases

## 2024-04-09 ENCOUNTER — Ambulatory Visit (INDEPENDENT_AMBULATORY_CARE_PROVIDER_SITE_OTHER): Payer: No Typology Code available for payment source | Admitting: Infectious Diseases

## 2024-04-09 VITALS — BP 125/80 | HR 83 | Temp 98.5°F | Ht 70.0 in | Wt 153.0 lb

## 2024-04-09 DIAGNOSIS — Z0000 Encounter for general adult medical examination without abnormal findings: Secondary | ICD-10-CM | POA: Diagnosis present

## 2024-04-09 DIAGNOSIS — Z79899 Other long term (current) drug therapy: Secondary | ICD-10-CM | POA: Insufficient documentation

## 2024-04-09 DIAGNOSIS — Z23 Encounter for immunization: Secondary | ICD-10-CM | POA: Diagnosis not present

## 2024-04-09 DIAGNOSIS — B20 Human immunodeficiency virus [HIV] disease: Secondary | ICD-10-CM

## 2024-04-09 MED ORDER — BIKTARVY 50-200-25 MG PO TABS: 1.0000 | ORAL_TABLET | Freq: Every day | ORAL | 11 refills | Status: AC

## 2024-04-09 NOTE — Progress Notes (Unsigned)
 [address], [place], [place], [address]                                                                  Phn. [phone]; Fax: [phone]                                                                             Date: 04/09/24  Reason for Visit: HIV follow up   HPI: Frederick Abbott is a 53 y.o.old male with a history of DM2, HLD, HTN, GERD, Anxiety/Depression who is here for Fu. Patient previously followed by Dr Daina Drum for several years.   Prior ART: Atripla, switched to Genvoya  in 04/22/2016 for safer ART> switched to Biktarvy  12/06/2018 - current  Prior Genotype geonotype in 10/20/2017 not available   04/09/24 Reports compliance with Biktarvy  with no missed doses or concerns. He is sexually active with a male partner but less frequently than before. He has no concerns regarding sexually transmitted diseases and does not wish to be screened for STDs at this time. He consumes alcohol occasionally but denies smoking and recreational drug use. Willing to get Menveo and PCV 20. He regularly follows with PCP and reports A1c went down from Mounjaro. He is aware about colon ca screening. No complaints otherwise.   ROS: As stated in above HPI; all other systems were reviewed and are otherwise negative unless noted below  No reported fever / chills, night sweats, unintentional weight loss, acute visual change, odynophagia, chest pain/pressure, new or worsened SOB or WOB, nausea, vomiting, diarrhea, dysuria, GU discharge, syncope, seizures, red/hot swollen joints, hallucinations / delusions, rashes, new allergies, unusual / excessive bleeding, swollen lymph nodes, or new hospitalizations/ED visits/Urgent Care visits since the pt was last seen.  PMH/ PSH/ FamHx / Social Hx , medications and allergies  reviewed and updated as appropriate; please see corresponding tab in EHR / prior notes  Current Outpatient Medications on File Prior to Visit  Medication Sig Dispense Refill   ALPRAZolam  (XANAX ) 0.5 MG tablet Take 1 tablet (0.5 mg total) by mouth 2 (two) times daily as needed for anxiety. 30 tablet 1   aspirin  81 MG tablet Take 81 mg by mouth daily.     atorvastatin  (LIPITOR) 20 MG tablet Take 0.5 tablets (10 mg total) by mouth daily.     BD PEN NEEDLE NANO U/F 32G X 4 MM MISC USE 1 NEEDLE TO INJECT 24 UNITS INTO THE SKIN DAILY. 100 each 3   buPROPion  (WELLBUTRIN  XL) 300 MG 24 hr tablet TAKE 1 TABLET BY MOUTH EVERY DAY 90 tablet 0   Continuous Glucose Sensor (FREESTYLE LIBRE 3 PLUS  SENSOR) MISC by Does not apply route. Change sensor every 15 days.     fenofibrate  160 MG tablet TAKE 1 TABLET BY MOUTH EVERY DAY 90 tablet 1   fluticasone  (FLONASE ) 50 MCG/ACT nasal spray PLACE 1 SPRAY INTO BOTH NOSTRILS 2 (TWO) TIMES DAILY 48 mL 1   Glucagon  (GVOKE HYPOPEN  2-PACK) 1 MG/0.2ML SOAJ Inject 1 pen into the skin as needed.     Glucagon , rDNA, (GLUCAGON  EMERGENCY) 1 MG KIT Inject 1 mg as directed as needed for up to 2 doses (administer when CBG is less than 55 and repeat x1 in 15 minutes if needed). 1 kit 2   glucose blood test strip 1 each by Other route 3 (three) times daily as needed. Use as directed.     ibuprofen  (ADVIL ,MOTRIN ) 200 MG tablet Take 600 mg by mouth every 6 (six) hours as needed for moderate pain.     insulin  glargine, 1 Unit Dial, (TOUJEO  SOLOSTAR) 300 UNIT/ML Solostar Pen 12 units     insulin  lispro (HUMALOG KWIKPEN) 200 UNIT/ML KwikPen 4 units+correctional     JARDIANCE  25 MG TABS tablet Take 25 mg by mouth daily.     lisinopril  (ZESTRIL ) 10 MG tablet Take 0.5 tablets (5 mg total) by mouth daily.     loratadine  (CLARITIN ) 10 MG tablet Take 10 mg by mouth daily.     magnesium oxide (MAG-OX) 400 MG tablet Take 400 mg by mouth daily.     Menthol 5 % PTCH Apply 1 patch topically as needed  (knee pain).     mineral oil-hydrophilic petrolatum (AQUAPHOR) ointment Apply topically as needed for dry skin. 420 g 0   MOUNJARO 5 MG/0.5ML Pen INJECT 5 MG SUBCUTANEOUS ONCE WEEKLY 30 DAYS     Multiple Vitamin (MULTIVITAMIN WITH MINERALS) TABS tablet Take 1 tablet by mouth daily.     ondansetron  (ZOFRAN -ODT) 4 MG disintegrating tablet Take 1 tablet (4 mg total) by mouth every 8 (eight) hours as needed for nausea or vomiting. 20 tablet 0   pantoprazole  (PROTONIX ) 40 MG tablet TAKE 1 TABLET BY MOUTH EVERY DAY 90 tablet 1   Probiotic Product (PROBIOTIC DAILY PO) Take by mouth.     sucralfate  (CARAFATE ) 1 g tablet Take 1 tablet (1 g total) by mouth 4 (four) times daily -  with meals and at bedtime. 40 tablet 0   tadalafil  (CIALIS ) 20 MG tablet TAKE 1/2 TO 1 TABLET BY MOUTH AS NEEDED AS DIRECTED 6 tablet 8   MOUNJARO 2.5 MG/0.5ML Pen Inject 2.5 mg into the skin once a week. (Patient not taking: Reported on 04/09/2024)     sodium chloride  (OCEAN) 0.65 % SOLN nasal spray Place 2 sprays into both nostrils every 2 (two) hours while awake.     No current facility-administered medications on file prior to visit.   Allergies  Allergen Reactions   Phenytoin Other (See Comments)    Childhood pt unsure Other reaction(s): Unknown   Metformin And Related Nausea And Vomiting   Metformin Hcl Other (See Comments)    Other reaction(s): Upset Stomach   Other Other (See Comments)   Past Medical History:  Diagnosis Date   Anxiety    Belching    Cholecystitis chronic    Depression    Diarrhea    "comes and goes" (12/14/2017)   GERD (gastroesophageal reflux disease)    Hepatitis A    Hepatitis B    HIV (human immunodeficiency virus infection) (HCC) dx'd 12/18/1995   Hypercholesterolemia    Neuromuscular disorder (  HCC)    neuropathy   Neuropathy    Pneumonia    childhood   Seasonal allergies    Seizures (HCC)    "as a child, none since age 32; sister had them too" (12/14/2017)   Type II diabetes mellitus  [facility])    Past Surgical History:  Procedure Laterality Date   APPENDECTOMY  12/14/2017   CHOLECYSTECTOMY  05/29/2012   Procedure: LAPAROSCOPIC CHOLECYSTECTOMY WITH INTRAOPERATIVE CHOLANGIOGRAM;  Surgeon: Kari Otto. Eli Grizzle, MD;  Location: [facility];  Service: General;  Laterality: N/A;   JOINT REPLACEMENT     LAPAROSCOPIC APPENDECTOMY N/A 12/14/2017   Procedure: APPENDECTOMY LAPAROSCOPIC;  Surgeon: Melvenia Stabs, MD;  Location: [facility] OR;  Service: General;  Laterality: N/A;   TONSILLECTOMY  1973   TOTAL HIP ARTHROPLASTY Bilateral 01/2005-08/2005   "left-right"   Social History   Socioeconomic History   Marital status: Married    Spouse name: Not on file   Number of children: Not on file   Years of education: Not on file   Highest education level: Bachelor's degree (e.g., BA, AB, BS)  Occupational History   Not on file  Tobacco Use   Smoking status: Former    Current packs/day: 0.00    Average packs/day: 0.8 packs/day for 10.0 years (8.0 ttl pk-yrs)    Types: Cigarettes    Start date: 05/06/1996    Quit date: 05/06/2006    Years since quitting: 17.9    Passive exposure: Past (parents and grandparents smoked in home car he moved out age 61)   Smokeless tobacco: Never  Vaping Use   Vaping status: Never Used  Substance and Sexual Activity   Alcohol use: Yes    Alcohol/week: 2.0 standard drinks of alcohol    Types: 2 Cans of beer per week   Drug use: Not Currently    Types: Marijuana    Comment: 12/14/2017 'last used 1 wk ago"   Sexual activity: Yes    Partners: Male    Comment: declined condoms 03/03/21  Other Topics Concern   Not on file  Social History Narrative   Not on file   Social Drivers of Health   Financial Resource Strain: Low Risk  (09/16/2023)   Overall Financial Resource Strain (CARDIA)    Difficulty of Paying Living Expenses: Not hard at all  Food Insecurity: No Food Insecurity (09/16/2023)   Hunger Vital Sign    Worried About Running Out of Food in the Last  Year: Never true    Ran Out of Food in the Last Year: Never true  Transportation Needs: No Transportation Needs (09/16/2023)   PRAPARE - Administrator, Civil Service (Medical): No    Lack of Transportation (Non-Medical): No  Physical Activity: Insufficiently Active (09/16/2023)   Exercise Vital Sign    Days of Exercise per Week: 3 days    Minutes of Exercise per Session: 10 min  Stress: No Stress Concern Present (09/16/2023)   [employer] of Occupational Health - Occupational Stress Questionnaire    Feeling of Stress : Not at all  Social Connections: Moderately Isolated (09/16/2023)   Social Connection and Isolation Panel [NHANES]    Frequency of Communication with Friends and Family: More than three times a week    Frequency of Social Gatherings with Friends and Family: Twice a week    Attends Religious Services: Never    Database administrator or Organizations: No    Attends Banker Meetings: Not on file  Marital Status: Married  Catering manager Violence: Not on file   Family History  Problem Relation Age of Onset   Cancer Mother        breast   Cancer Maternal Grandmother        melanoma-nose   Cancer Paternal Grandfather        melanoma-ear   Colon cancer Neg Hx    Esophageal cancer Neg Hx    Rectal cancer Neg Hx    Stomach cancer Neg Hx     Vitals BP 125/80   Pulse 83   Temp 98.5 F (36.9 C) (Temporal)   Ht 5\' 10"  (1.778 m)   Wt 153 lb (69.4 kg)   SpO2 97%   BMI 21.95 kg/m   Examination  Gen: no acute distress HEENT: [place]/AT, no scleral icterus, no pale conjunctivae, hearing normal, oral mucosa moist Neck: Supple Cardio: Regular rate and rhythm Resp: Pulmonary effort normal in room air GI: nondistended GU: Musc: Extremities: No pedal edema Skin: No rashes Neuro: grossly non focal , awake, alert and oriented * 3, ambulatory, following commands.  Psych: Calm, cooperative  Lab Results HIV 1 RNA Quant  Date Value   04/05/2023 NOT DETECTED copies/mL  02/09/2022 Not Detected Copies/mL  02/12/2021 Not Detected Copies/mL   CD4 T Cell Abs (/uL)  Date Value  04/05/2023 1,058  02/09/2022 842  02/12/2021 866   No results found for: "HIV1GENOSEQ" Lab Results  Component Value Date   WBC 7.3 03/29/2024   HGB 16.8 03/29/2024   HCT 50.1 03/29/2024   MCV 93 03/29/2024   PLT 277 03/29/2024    Lab Results  Component Value Date   CREATININE 1.23 03/29/2024   BUN 17 03/29/2024   NA 140 03/29/2024   K 4.5 03/29/2024   CL 102 03/29/2024   CO2 28 09/16/2023   Lab Results  Component Value Date   ALT 30 03/29/2024   AST 26 03/29/2024   GGT 31 03/29/2024   ALKPHOS 62 03/29/2024   BILITOT 0.8 03/29/2024    Lab Results  Component Value Date   CHOL 154 03/29/2024   TRIG 132 03/29/2024   HDL 45 03/29/2024   LDLCALC 86 03/29/2024   Lab Results  Component Value Date   HAV REACTIVE (A) 12/31/2014   Lab Results  Component Value Date   HEPBSAG NEGATIVE 12/27/2014   HEPBSAB POS (A) 12/27/2014   Lab Results  Component Value Date   HCVAB NEGATIVE 12/27/2014   Lab Results  Component Value Date   CHLAMYDIAWP Negative 04/05/2023   N Negative 04/05/2023   No results found for: "GCPROBEAPT" No results found for: "QUANTGOLD"  Health Maintenance: Immunization History  Administered Date(s) Administered   H1N1 10/17/2008   Influenza Inj Mdck Quad Pf 07/16/2018   Influenza Split 09/23/2011   Influenza Whole 07/19/2006, 08/18/2007, 08/20/2008, 08/19/2009, 08/17/2010   Influenza, Seasonal, Injecte, Preservative Fre 08/29/2023   Influenza,inj,Quad PF,6+ Mos 08/09/2013, 08/15/2014, 08/15/2014, 09/11/2015, 08/17/2016, 07/16/2018, 07/24/2019, 08/24/2021, 08/13/2022   Influenza-Unspecified 08/19/2017, 08/21/2020, 08/10/2022   Janssen (J&J) SARS-COV-2 Vaccination 01/20/2020   Moderna Covid-19 Fall Seasonal Vaccine 47yrs & older 09/10/2022   Moderna Sars-Covid-2 Vaccination 09/05/2020, 03/27/2021,  09/11/2022   Pfizer(Comirnaty)Fall Seasonal Vaccine 12 years and older 08/29/2023   Pneumococcal Conjugate-13 12/06/2018   Pneumococcal Polysaccharide-23 06/09/2006, 09/23/2011   Tdap 08/09/2013, 07/26/2023   Zoster Recombinant(Shingrix) 05/08/2020, 06/24/2020   Assessment/Plan: # HIV in MSM - Continue Biktarvy   - 5/22 CBC, CMP, lipid panel reviewed  - HIV RNA and CD4 today  -  Fu in a  year per patient's preference  # STD screening  - low risk, deferred   # DM/HTN/HLD - fu with PCP, last seen 09/16/23  # Anxiety/depression - Stable, no concerns   # Immunization  - Menveo #1 and PCV 20 today   #Health maintenance Quantiferon ordered  LDL 86 03/29/24 OI ppx if indicated None  HLA B5701 Neg 04/2016 Anal Pap today  Dental Care discussed  Colonoscopy 10/2015 - normal, next due in 2026.   Patient's labs were reviewed as well as his previous records. Patients questions were addressed and answered. Safe sex counseling done.  I spent 33 minutes involved in face-to-face and non-face-to-face activities for this patient on the day of the visit. Professional time spent includes the following activities: Preparing to see the patient (review of tests), Obtaining and reviewing separately obtained history (last PCP note, endocrinology note 03/23/24), Performing a medically appropriate examination and evaluation , Ordering medications/lab/vaccines, Documenting clinical information in the EMR, Independently interpreting results (not separately reported), Communicating results to the patient, Counseling and educating the patient and Care coordination (not separately reported).   Electronically signed by:  Terre Ferri, MD Infectious Disease Physician [facility] [address] [place], [place] [address] Phone: [phone]  Fax: [phone]

## 2024-04-10 LAB — T-HELPER CELLS (CD4) COUNT (NOT AT ARMC)
CD4 % Helper T Cell: 41 % (ref 33–65)
CD4 T Cell Abs: 1470 /uL (ref 400–1790)

## 2024-04-12 DIAGNOSIS — Z23 Encounter for immunization: Secondary | ICD-10-CM | POA: Insufficient documentation

## 2024-04-12 LAB — HIV RNA, RTPCR W/R GT (RTI, PI,INT)
HIV 1 RNA Quant: <20 {copies}/mL — AB
HIV-1 RNA Quant, Log: <1.3 {Log_copies}/mL — AB

## 2024-04-12 LAB — QUANTIFERON-TB GOLD PLUS
Mitogen-NIL: >10 [IU]/mL
NIL: 0.14 [IU]/mL
QuantiFERON-TB Gold Plus: NEGATIVE
TB1-NIL: <0 [IU]/mL
TB2-NIL: 0.02 [IU]/mL

## 2024-04-13 LAB — CYTOLOGY - PAP: Diagnosis: NEGATIVE

## 2024-04-18 ENCOUNTER — Other Ambulatory Visit: Payer: Self-pay | Admitting: Family Medicine

## 2024-04-20 ENCOUNTER — Ambulatory Visit: Payer: Self-pay | Admitting: Infectious Diseases

## 2024-05-10 ENCOUNTER — Ambulatory Visit

## 2024-05-10 NOTE — Progress Notes (Unsigned)
 EE reported to clinic with blood sugar 53 according to his Libra 3 device.  States he already has eaten half of protein bar and drank half carton of chocolate milk.  Took 5 of MOUNJARO this morning because he was scheduled to take that last night but had a low blood sugar episode and did not take it then.  Given glutose tube now and over 10 minutes the blood sugar gradually came up to 68.  Was advised to finish the protein bar and also the milk upon return to his work station and contact his physician for additional advice regarding the mounjaro being reduced back to 2.5 instead of the 5.  Employee had no low blood sugar symptoms such as light headedness, shaking, mental confusion.  He was alerted to the episode by the Franklin 3 device. EE given another tube of glutose to take back to the work station to use prn should he have another episode of low blood sugar.  Apolinar Sharps, RN, MPH, BSN, COHN-S

## 2024-05-17 ENCOUNTER — Encounter: Payer: Self-pay | Admitting: Registered Nurse

## 2024-05-17 ENCOUNTER — Telehealth: Payer: Self-pay | Admitting: Registered Nurse

## 2024-05-17 DIAGNOSIS — I10 Essential (primary) hypertension: Secondary | ICD-10-CM

## 2024-05-17 DIAGNOSIS — E1069 Type 1 diabetes mellitus with other specified complication: Secondary | ICD-10-CM

## 2024-05-17 MED ORDER — LISINOPRIL 10 MG PO TABS: 5.0000 mg | ORAL_TABLET | Freq: Every day | ORAL | 0 refills | Status: DC

## 2024-05-17 MED ORDER — ATORVASTATIN CALCIUM 20 MG PO TABS: 10.0000 mg | ORAL_TABLET | Freq: Every day | ORAL | 0 refills | Status: DC

## 2024-05-17 NOTE — Telephone Encounter (Signed)
 Patient requested PDRx refill atorvastatin  20mg  sig t1/2 po daily #90 and lisinopril  10mg  sig t1/2 po daily #90 RF0  Last filled 11/15/2023 90 tabs each. Last BP 04/09/24 125/80  Last labs stable  Dispensed 90 tabs each to patient today and instructed to keep follow up with scheduled with PCM/endocrinology.  Last PCM appt 03/23/24  Next labs due May 2026  Patient verbalized understanding information/instructions, agreed with plan of care and had no further questions at this time.  Latest Reference Range & Units 03/29/24 07:50  Sodium 134 - 144 mmol/L 140  Potassium 3.5 - 5.2 mmol/L 4.5  Chloride 96 - 106 mmol/L 102  Glucose 70 - 99 mg/dL 888 (H)  BUN 6 - 24 mg/dL 17  Creatinine 9.23 - 8.72 mg/dL 8.76  Calcium  8.7 - 10.2 mg/dL 9.6  BUN/Creatinine Ratio 9 - 20  14  eGFR >59 mL/min/1.73 70  Phosphorus 2.8 - 4.1 mg/dL 3.3  Alkaline Phosphatase 44 - 121 IU/L 62  Albumin 3.8 - 4.9 g/dL 4.9  Uric Acid 3.8 - 8.4 mg/dL 4.0  AST 0 - 40 IU/L 26  ALT 0 - 44 IU/L 30  Total Protein 6.0 - 8.5 g/dL 7.0  Total Bilirubin 0.0 - 1.2 mg/dL 0.8  GGT 0 - 65 IU/L 31  Estimated CHD Risk 0.0 - 1.0 times avg. 0.5  LDH 121 - 224 IU/L 111 (L)  Total CHOL/HDL Ratio 0.0 - 5.0 ratio 3.4  Cholesterol, Total 100 - 199 mg/dL 845  HDL Cholesterol >60 mg/dL 45  Triglycerides 0 - 850 mg/dL 867  VLDL Cholesterol Cal 5 - 40 mg/dL 23  LDL Chol Calc (NIH) 0 - 99 mg/dL 86  Iron 38 - 830 ug/dL 784 (H)  Globulin, Total 1.5 - 4.5 g/dL 2.1  WBC 3.4 - 89.1 [id number] 7.3  RBC 4.14 - 5.80 x10E6/uL 5.41  Hemoglobin 13.0 - 17.7 g/dL 83.1  HCT 62.4 - 48.9 % 50.1  MCV 79 - 97 fL 93  MCH 26.6 - 33.0 pg 31.1  MCHC 31.5 - 35.7 g/dL 66.4  RDW 88.3 - 84.5 % 13.1  Platelets 150 - 450 x10E3/uL 277  Neutrophils Not Estab. % 46  Immature Granulocytes Not Estab. % 0  NEUT# 1.4 - 7.0 x10E3/uL 3.4  Lymphs Abs 0.7 - 3.1 x10E3/uL 2.7  Monocytes Absolute 0.1 - 0.9 x10E3/uL 0.7  Basophils Absolute 0.0 - 0.2 x10E3/uL 0.1  Immature Grans (Abs)  0.0 - 0.1 x10E3/uL 0.0  Lymphs Not Estab. % 36  Monocytes Not Estab. % 10  Basos Not Estab. % 1  Eos Not Estab. % 7  EOS (ABSOLUTE) 0.0 - 0.4 x10E3/uL 0.5 (H)  Hemoglobin A1C 4.8 - 5.6 % 6.9 (H)  Est. average glucose Bld gHb Est-mCnc mg/dL 848  TSH 9.549 - 5.499 uIU/mL 2.920  Thyroxine (T4) 4.5 - 12.0 ug/dL 8.2  Free Thyroxine Index 1.2 - 4.9  2.1  T3 Uptake Ratio 24 - 39 % 25  (H): Data is abnormally high (L): Data is abnormally low

## 2024-05-24 ENCOUNTER — Other Ambulatory Visit: Payer: Self-pay | Admitting: Family Medicine

## 2024-05-24 NOTE — Telephone Encounter (Signed)
 Called patient and left vm to return call. Patient needs to schedule a follow up visit with Dr. Mahlon before medication can be filled.

## 2024-06-18 ENCOUNTER — Telehealth: Payer: Self-pay | Admitting: Registered Nurse

## 2024-06-18 DIAGNOSIS — J029 Acute pharyngitis, unspecified: Secondary | ICD-10-CM

## 2024-06-18 DIAGNOSIS — R52 Pain, unspecified: Secondary | ICD-10-CM

## 2024-06-18 MED ORDER — COVID-19 ANTIGEN TEST VI KIT: 1.0000 | PACK | Freq: Every day | 1 refills | Status: AC | PRN

## 2024-06-18 NOTE — Telephone Encounter (Signed)
 Per supervisor patient left work early Thursday and called out sick today and to please call employee.  Symptoms body aches, nausea and sore throat.  Covid is circulating in community and I do recommend home covid testing at this time.

## 2024-06-19 NOTE — Telephone Encounter (Signed)
 Supervisor and HR team notified patient excused absence extended 06/20/24 and expected RTW 06/21/24

## 2024-06-22 NOTE — Telephone Encounter (Signed)
 Patient seen in workcenter stated so far things going smoothly at work this am.  A little tired but otherwise feeling well and denied concerns.  No audible cough/congestion/throat clearing.  Skin warm dry and pink A&Ox3 gait sure and steady respirations even and unlabored RA spoke full sentences without difficulty.

## 2024-06-25 ENCOUNTER — Telehealth: Payer: Self-pay | Admitting: Infectious Diseases

## 2024-06-25 NOTE — Telephone Encounter (Signed)
 error

## 2024-07-02 ENCOUNTER — Ambulatory Visit: Admitting: Family Medicine

## 2024-07-26 ENCOUNTER — Other Ambulatory Visit: Payer: Self-pay | Admitting: Registered Nurse

## 2024-07-26 ENCOUNTER — Encounter: Payer: Self-pay | Admitting: Registered Nurse

## 2024-07-26 DIAGNOSIS — R638 Other symptoms and signs concerning food and fluid intake: Secondary | ICD-10-CM

## 2024-07-26 DIAGNOSIS — J301 Allergic rhinitis due to pollen: Secondary | ICD-10-CM

## 2024-07-26 DIAGNOSIS — Z794 Long term (current) use of insulin: Secondary | ICD-10-CM

## 2024-07-26 NOTE — Telephone Encounter (Signed)
 Patient contacted stated he did not need refills at this time.  Rx refused

## 2024-07-31 NOTE — Addendum Note (Signed)
 Addended by: RANAE CITY A on: 07/31/2024 01:22 PM   Modules accepted: Orders, Level of Service

## 2024-08-01 ENCOUNTER — Ambulatory Visit: Payer: Self-pay | Admitting: Registered Nurse

## 2024-08-01 DIAGNOSIS — E119 Type 2 diabetes mellitus without complications: Secondary | ICD-10-CM

## 2024-08-01 LAB — SPECIMEN STATUS REPORT

## 2024-08-01 NOTE — Addendum Note (Signed)
 Addended by: RANAE CITY A on: 08/01/2024 02:17 PM   Modules accepted: Orders

## 2024-08-02 LAB — MICROALBUMIN / CREATININE URINE RATIO

## 2024-08-02 LAB — SPECIMEN STATUS REPORT

## 2024-08-02 NOTE — Addendum Note (Signed)
 Addended by: RANAE CITY A on: 08/02/2024 12:09 PM   Modules accepted: Level of Service

## 2024-08-03 ENCOUNTER — Ambulatory Visit: Payer: Self-pay | Admitting: Registered Nurse

## 2024-08-03 DIAGNOSIS — E119 Type 2 diabetes mellitus without complications: Secondary | ICD-10-CM

## 2024-08-03 LAB — SPECIMEN STATUS REPORT

## 2024-08-03 LAB — IRON: Iron: 86 ug/dL (ref 38–169)

## 2024-08-20 ENCOUNTER — Ambulatory Visit: Admitting: General Practice

## 2024-08-20 ENCOUNTER — Other Ambulatory Visit: Payer: Self-pay | Admitting: Family Medicine

## 2024-08-20 DIAGNOSIS — Z23 Encounter for immunization: Secondary | ICD-10-CM

## 2024-08-22 ENCOUNTER — Ambulatory Visit: Admitting: Family Medicine

## 2024-08-22 ENCOUNTER — Encounter: Payer: Self-pay | Admitting: Family Medicine

## 2024-08-22 VITALS — BP 104/68 | Temp 98.3°F | Ht 70.0 in | Wt 143.5 lb

## 2024-08-22 DIAGNOSIS — M2550 Pain in unspecified joint: Secondary | ICD-10-CM

## 2024-08-22 DIAGNOSIS — E119 Type 2 diabetes mellitus without complications: Secondary | ICD-10-CM

## 2024-08-22 DIAGNOSIS — Z0000 Encounter for general adult medical examination without abnormal findings: Secondary | ICD-10-CM | POA: Diagnosis not present

## 2024-08-22 DIAGNOSIS — Z125 Encounter for screening for malignant neoplasm of prostate: Secondary | ICD-10-CM

## 2024-08-22 LAB — BASIC METABOLIC PANEL WITH GFR
BUN: 18 mg/dL (ref 6–23)
CO2: 22 meq/L (ref 19–32)
Calcium: 9.2 mg/dL (ref 8.4–10.5)
Chloride: 103 meq/L (ref 96–112)
Creatinine, Ser: 1.3 mg/dL (ref 0.40–1.50)
GFR: 62.27 mL/min (ref >60.00)
Glucose, Bld: 216 mg/dL — ABNORMAL HIGH (ref 70–99)
Potassium: 4.1 meq/L (ref 3.5–5.1)
Sodium: 140 meq/L (ref 135–145)

## 2024-08-22 LAB — CBC WITH DIFFERENTIAL/PLATELET
Basophils Absolute: 0 K/uL (ref 0.0–0.1)
Basophils Relative: 0.7 % (ref 0.0–3.0)
Eosinophils Absolute: 0.4 K/uL (ref 0.0–0.7)
Eosinophils Relative: 6.4 % — ABNORMAL HIGH (ref 0.0–5.0)
HCT: 46.7 % (ref 39.0–52.0)
Hemoglobin: 15.5 g/dL (ref 13.0–17.0)
Lymphocytes Relative: 27.1 % (ref 12.0–46.0)
Lymphs Abs: 1.6 K/uL (ref 0.7–4.0)
MCHC: 33.1 g/dL (ref 30.0–36.0)
MCV: 92.5 fl (ref 78.0–100.0)
Monocytes Absolute: 0.7 K/uL (ref 0.1–1.0)
Monocytes Relative: 11.1 % (ref 3.0–12.0)
Neutro Abs: 3.3 K/uL (ref 1.4–7.7)
Neutrophils Relative %: 54.7 % (ref 43.0–77.0)
Platelets: 258 K/uL (ref 150.0–400.0)
RBC: 5.04 Mil/uL (ref 4.22–5.81)
RDW: 13.2 % (ref 11.5–15.5)
WBC: 6 K/uL (ref 4.0–10.5)

## 2024-08-22 LAB — HEPATIC FUNCTION PANEL
ALT: 25 U/L (ref 0–53)
AST: 24 U/L (ref 0–37)
Albumin: 4.7 g/dL (ref 3.5–5.2)
Alkaline Phosphatase: 49 U/L (ref 39–117)
Bilirubin, Direct: 0.2 mg/dL (ref 0.0–0.3)
Total Bilirubin: 0.7 mg/dL (ref 0.2–1.2)
Total Protein: 6.7 g/dL (ref 6.0–8.3)

## 2024-08-22 LAB — LIPID PANEL
Cholesterol: 140 mg/dL (ref 0–200)
HDL: 47.1 mg/dL (ref >39.00)
LDL Cholesterol: 70 mg/dL (ref 0–99)
NonHDL: 92.85
Total CHOL/HDL Ratio: 3
Triglycerides: 114 mg/dL (ref 0.0–149.0)
VLDL: 22.8 mg/dL (ref 0.0–40.0)

## 2024-08-22 LAB — TSH: TSH: 2.56 u[IU]/mL (ref 0.35–5.50)

## 2024-08-22 LAB — PSA: PSA: 0.73 ng/mL (ref 0.10–4.00)

## 2024-08-22 NOTE — Progress Notes (Signed)
   Subjective:    Patient ID: Frederick Abbott, male    DOB: Oct 01, 1971, 53 y.o.   MRN: [id number]  HPI CPE- UTD on eye exam, microalbumin, colonoscopy, Tdap.  Due for foot exam.  Patient Care Team    Relationship Specialty Notifications Start End  Mahlon Comer BRAVO, MD PCP - General Family Medicine  08/31/12   Elaine Rush, MD (Inactive) PCP - Infectious Diseases Infectious Diseases  10/21/10   Aneita Gwendlyn DASEN, MD (Inactive) Consulting Physician Gastroenterology  09/11/15   Josephina Aures, MD Consulting Physician Endocrinology  12/20/19      Health Maintenance  Topic Date Due   Hepatitis B Vaccines 19-59 Average Risk (1 of 3 - 19+ 3-dose series) Never done   Influenza Vaccine  06/08/2024   OPHTHALMOLOGY EXAM  10/26/2024   HEMOGLOBIN A1C  01/23/2025   Diabetic kidney evaluation - eGFR measurement  07/26/2025   Diabetic kidney evaluation - Urine ACR  07/26/2025   FOOT EXAM  08/22/2025   Colonoscopy  10/26/2025   DTaP/Tdap/Td (3 - Td or Tdap) 07/25/2033   Pneumococcal Vaccine: 50+ Years  Completed   Hepatitis C Screening  Completed   HIV Screening  Completed   Zoster Vaccines- Shingrix  Completed   HPV VACCINES  Aged Out   Meningococcal B Vaccine  Aged Out   COVID-19 Vaccine  Discontinued      Review of Systems Patient reports no vision/hearing changes, anorexia, fever ,adenopathy, persistant/recurrent hoarseness, swallowing issues, chest pain, palpitations, edema, persistant/recurrent cough, hemoptysis, gastrointestinal  bleeding (melena, rectal bleeding), abdominal pain, excessive heart burn, GU symptoms (dysuria, hematuria, voiding/incontinence issues) syncope, focal weakness, memory loss, numbness & tingling, skin/hair/nail changes, depression, anxiety, abnormal bruising/bleeding, musculoskeletal symptoms/signs.   + SOB- occurring w/ minimal exertion (car to work entrance), but able to move pallets w/o difficulty + joint pain- multiple different sites + fatigue     Objective:   Physical Exam General Appearance:    Alert, cooperative, no distress, appears stated age  Head:    Normocephalic, without obvious abnormality, atraumatic  Eyes:    PERRL, conjunctiva/corneas clear, EOM's intact both eyes       Ears:    Normal TM's and external ear canals, both ears  Nose:   Nares normal, septum midline, mucosa normal, no drainage   or sinus tenderness  Throat:   Lips, mucosa, and tongue normal; teeth and gums normal  Neck:   Supple, symmetrical, trachea midline, no adenopathy;       thyroid :  No enlargement/tenderness/nodules  Back:     Symmetric, no curvature, ROM normal, no CVA tenderness  Lungs:     Clear to auscultation bilaterally, respirations unlabored  Chest wall:    No tenderness or deformity  Heart:    Regular rate and rhythm, S1 and S2 normal, no murmur, rub   or gallop  Abdomen:     Soft, non-tender, bowel sounds active all four quadrants,    no masses, no organomegaly  Genitalia:    deferred  Rectal:    Extremities:   Extremities normal, atraumatic, no cyanosis or edema  Pulses:   2+ and symmetric all extremities  Skin:   Skin color, texture, turgor normal, no rashes or lesions  Lymph nodes:   Cervical, supraclavicular, and axillary nodes normal  Neurologic:   CNII-XII intact. Normal strength, sensation and reflexes      throughout          Assessment & Plan:

## 2024-08-22 NOTE — Patient Instructions (Signed)
 Follow up in 6 months to recheck cholesterol and blood pressure We'll notify you of your lab results and make any changes if needed Make sure you are eating regularly!!!  Don't lose any more weight! Try logging your symptoms- shortness of breath, dizziness, etc Make sure you are drinking LOTS of water, increase your salt intake- try electrolyte drinks as needed Call with any questions or concerns Stay Safe!  Stay Healthy! Happy Fall!!!

## 2024-08-22 NOTE — Assessment & Plan Note (Signed)
 Pt's PE WNL.  UTD on colonoscopy, Tdap, eye exam, microalbumin.  Foot exam done today.  Check labs.  Anticipatory guidance provided.

## 2024-08-23 ENCOUNTER — Ambulatory Visit: Payer: Self-pay | Admitting: Family Medicine

## 2024-08-23 LAB — RHEUMATOID FACTOR: Rheumatoid fact SerPl-aCnc: <10 [IU]/mL (ref <14)

## 2024-08-23 LAB — ANA: Anti Nuclear Antibody (ANA): NEGATIVE

## 2024-08-23 NOTE — Progress Notes (Signed)
 Pt has reviewed via MyChart

## 2024-08-28 ENCOUNTER — Other Ambulatory Visit: Payer: Self-pay | Admitting: Registered Nurse

## 2024-08-28 DIAGNOSIS — Z23 Encounter for immunization: Secondary | ICD-10-CM

## 2024-08-29 ENCOUNTER — Other Ambulatory Visit: Payer: Self-pay | Admitting: General Practice

## 2024-08-29 ENCOUNTER — Other Ambulatory Visit: Payer: Self-pay | Admitting: Registered Nurse

## 2024-08-29 ENCOUNTER — Ambulatory Visit: Admitting: General Practice

## 2024-08-29 DIAGNOSIS — E119 Type 2 diabetes mellitus without complications: Secondary | ICD-10-CM

## 2024-08-29 LAB — POCT URINALYSIS DIPSTICK OB
Blood, UA: NEGATIVE
Ketones, UA: NEGATIVE
Leukocytes, UA: NEGATIVE
Nitrite, UA: NEGATIVE
POC,PROTEIN,UA: NEGATIVE
Spec Grav, UA: 1.01 (ref 1.010–1.025)
Urobilinogen, UA: 0.2 U/dL
pH, UA: 5 (ref 5.0–8.0)

## 2024-08-29 NOTE — Progress Notes (Signed)
 Patient stated he would go to clinic to give urine sample to RN Karene on 22 October for microalbumin

## 2024-08-30 ENCOUNTER — Other Ambulatory Visit: Payer: Self-pay | Admitting: General Practice

## 2024-08-30 ENCOUNTER — Ambulatory Visit: Payer: Self-pay | Admitting: Registered Nurse

## 2024-08-30 DIAGNOSIS — R81 Glycosuria: Secondary | ICD-10-CM

## 2024-08-30 NOTE — Addendum Note (Signed)
 Addended by: BRANDY KARENE BROCKS on: 08/30/2024 12:52 PM   Modules accepted: Orders

## 2024-08-30 NOTE — Progress Notes (Signed)
   Established Patient Office Visit  Subjective   Patient ID: Frederick Abbott, male    DOB: 1971-04-05  Age: 53 y.o. MRN: [id number]  No chief complaint on file.   HPI    ROS    Objective:     There were no vitals taken for this visit.   Physical Exam   No results found for any visits on 08/29/24.    The 10-year ASCVD risk score (Arnett DK, et al., 2019) is: 5.4%    Assessment & Plan:   Problem List Items Addressed This Visit   None Visit Diagnoses       Well controlled type 2 diabetes mellitus (HCC)    -  Primary   Relevant Orders   Microalbumin / creatinine urine ratio   POC Urinalysis Dipstick OB       No follow-ups on file.    Luther Newhouse C, RN

## 2024-08-30 NOTE — Progress Notes (Signed)
 Late Entry for 08/29/24, POCT Urine dipstick test.

## 2024-08-31 ENCOUNTER — Ambulatory Visit: Payer: Self-pay | Admitting: Registered Nurse

## 2024-09-04 DIAGNOSIS — Z23 Encounter for immunization: Secondary | ICD-10-CM | POA: Insufficient documentation

## 2024-09-04 MED ORDER — COVID-19 MRNA VAC-TRIS(PFIZER) 30 MCG/0.3ML IM SUSY: 0.3000 mL | PREFILLED_SYRINGE | Freq: Once | INTRAMUSCULAR | Status: AC

## 2024-09-04 NOTE — Progress Notes (Signed)
 [facility] clinic administration

## 2024-09-05 ENCOUNTER — Other Ambulatory Visit: Payer: Self-pay | Admitting: General Practice

## 2024-09-05 LAB — MICROALBUMIN / CREATININE URINE RATIO
Creatinine, Urine: 53.1 mg/dL
Microalb/Creat Ratio: <6 mg/g{creat} (ref 0–29)
Microalbumin, Urine: <3 ug/mL

## 2024-10-01 ENCOUNTER — Encounter: Payer: Self-pay | Admitting: Registered Nurse

## 2024-10-14 ENCOUNTER — Other Ambulatory Visit: Payer: Self-pay | Admitting: Family Medicine

## 2024-10-29 ENCOUNTER — Telehealth: Payer: Self-pay | Admitting: Registered Nurse

## 2024-10-29 ENCOUNTER — Encounter: Payer: Self-pay | Admitting: Registered Nurse

## 2024-10-29 DIAGNOSIS — J069 Acute upper respiratory infection, unspecified: Secondary | ICD-10-CM

## 2024-10-29 DIAGNOSIS — J019 Acute sinusitis, unspecified: Secondary | ICD-10-CM

## 2024-10-29 NOTE — Telephone Encounter (Signed)
 Patient contacted NP negative covid test today symptoms started Saturday with short of breath then yesterday woke up with tight head and congestion/body aches/fatigue/no appetitie.  Blood sugar today 104. Discussed if having vomiting hold po intake 1 hour then start clear liquids for 24 hours then advance as tolerated to bland diet.  Avoid dairy/spicy/fried until stomach has returned to normal.  Consider bananas, rice, toast, crackers, pasta, mashed potatoes for bland. 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  A&Ox3 spoke full sentences without difficulty no audible cough/throat clearing during 6 minute call.  Patient to start nasal saline 2 sprays each nostril q2h wa prn congestion and fluticasone  nasal 1 spray each nostril BID.  May continue dayquil and nyquil per manufacturer instructions.  Discussed if new symptoms to notify clinic staff.  Discussed many viruses going around in community at this time.  Flu typically has fever.  Patient denied fever this weekend or today.  Discussed with patient covid/influenza/metapneumovirus/adenovirus/RSV circulating in high levels in community.  Typically lasting symptoms 10-14 days.  Mucous clear to yellow to green then done. Discussed hydrate with water to keep urine pale yellow clear and voiding every 2-4 hours while awake. Shower prior to bedtime after work and am prn congestion Call or return to clinic as needed if these symptoms worsen or fail to improve as anticipated e.g. brown or brink opaque mucous/bloody mucous/chunky mucous, wheezing or dyspnea develop.   Exitcare handouts viral URI with cough, and sinus rinse sent to patient my chart.  HR and supervisor team notified excused absence today and re-evaluation tomorrow.  Patient cleared for work tomorrow if symptoms improved otherwise use normal call out procedures in am and follow up with clinic staff.  Patient  verbalized understanding of instructions, agreed with plan of care and had no further questions at this time.

## 2024-10-30 ENCOUNTER — Encounter: Payer: Self-pay | Admitting: Family Medicine

## 2024-10-30 LAB — COMPREHENSIVE METABOLIC PANEL WITH GFR
ALT: 25 IU/L (ref 0–44)
AST: 25 IU/L (ref 0–40)
Albumin: 4.9 g/dL (ref 3.8–4.9)
Alkaline Phosphatase: 65 IU/L (ref 47–123)
BUN/Creatinine Ratio: 13 (ref 9–20)
BUN: 15 mg/dL (ref 6–24)
Bilirubin Total: 0.5 mg/dL (ref 0.0–1.2)
CO2: 20 mmol/L (ref 20–29)
Calcium: 10 mg/dL (ref 8.7–10.2)
Chloride: 104 mmol/L (ref 96–106)
Creatinine, Ser: 1.13 mg/dL (ref 0.76–1.27)
Globulin, Total: 2 g/dL (ref 1.5–4.5)
Glucose: 107 mg/dL — ABNORMAL HIGH (ref 70–99)
Potassium: 4.2 mmol/L (ref 3.5–5.2)
Sodium: 142 mmol/L (ref 134–144)
Total Protein: 6.9 g/dL (ref 6.0–8.5)
eGFR: 77 mL/min/1.73

## 2024-10-30 LAB — HGB A1C W/O EAG: Hgb A1c MFr Bld: 6.3 % — ABNORMAL HIGH (ref 4.8–5.6)

## 2024-10-30 NOTE — Telephone Encounter (Signed)
Advised patient to go to UC

## 2024-10-30 NOTE — Telephone Encounter (Signed)
 Spoke with patient via telephone stated still no appetite but denied n/v/d/constipation.  Malaise, nasal congestion and rhinitis.  Discussed viral illness typically lasts 10-14 days mucous clear to yellow to green then done  May use nasal saline 2 sprays each nostril q2h wa, fluticasone  nasal 50mg  po BID and otc dayquil/nyquil.  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  Denied fever/chills.  Patient A&Ox3 spoke full sentences without difficulty nasal congestion audible during 3 minute call HR and supervisor notified of excused absence for 12/23-12/24/25  Patient agreed with plan of care and had no further questions at this time.

## 2024-10-31 ENCOUNTER — Telehealth: Payer: Self-pay | Admitting: General Practice

## 2024-10-31 NOTE — Telephone Encounter (Signed)
 Medford states feeling the same, just doesn't seem to be getting better. Does have an appetite today and eating small portions of meals. He checked his own temperature while on the phone with RN, result of 100.2 with a tympanic thermometer. He weighed himself this morning, weight is 127 lbs. Continues to drink water and Pedialyte, taking Dayquil/Nyquil. His urine has been 'regular yellow' denies any dark color. Denies nausea, vomiting or diarrhea.   RN advised to continue staying hydrated and eating more protein to avoid muscle wasting. Requested he check temperature again when he awakens on 11/02/24. 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 Will follow up on 11/05/24, next open clinic day.

## 2024-11-02 ENCOUNTER — Ambulatory Visit: Admission: EM | Admit: 2024-11-02 | Discharge: 2024-11-02 | Disposition: A | Discharge status: Home / Self Care

## 2024-11-02 DIAGNOSIS — B349 Viral infection, unspecified: Secondary | ICD-10-CM

## 2024-11-02 MED ORDER — AZELASTINE HCL 0.1 % NA SOLN: 1.0000 | Freq: Two times a day (BID) | NASAL | 1 refills | Status: AC

## 2024-11-02 MED ORDER — PROMETHAZINE-DM 6.25-15 MG/5ML PO SYRP: 10.0000 mL | ORAL_SOLUTION | Freq: Three times a day (TID) | ORAL | 0 refills | Status: AC | PRN

## 2024-11-02 MED ORDER — IBUPROFEN 800 MG PO TABS: 800.0000 mg | ORAL_TABLET | Freq: Three times a day (TID) | ORAL | 0 refills | Status: AC

## 2024-11-02 NOTE — ED Triage Notes (Signed)
 Pt states that he has a cough, nasal congestion, facial pressure, sob and wheezing. X5 days Pt states that he has also been running a fever.   Pt states that he has been taking Nyquil and Dayquil.

## 2024-11-02 NOTE — ED Provider Notes (Signed)
 " [facility]  Note:  This document was prepared using Dragon voice recognition software and may include unintentional dictation errors.  MRN: [id number] DOB: 16-Dec-1970  Subjective:   Frederick Abbott is a 53 y.o. male presenting for cough, nasal congestion, sinus congestion and pressure, wheezing, intermittent shortness of breath x 5 days.  Patient reports that he has had intermittent fevers and headaches.  Has been taking NyQuil and DayQuil with minimal improvement.  Patient denies any known sick contacts other than his partner who has the same symptoms as him.  Patient denies taking any other over-the-counter medication to treat symptoms prior to arrival in urgent care today.  No nausea/vomiting, diarrhea, weakness, dizziness, chest pain.  Current Medications[1]   Allergies[2]  Past Medical History:  Diagnosis Date   Anxiety    Belching    Cholecystitis chronic    Depression    Diarrhea    comes and goes (12/14/2017)   GERD (gastroesophageal reflux disease)    Hepatitis A    Hepatitis B    HIV (human immunodeficiency virus infection) (HCC) dx'd 12/18/1995   Hypercholesterolemia    Neuromuscular disorder (HCC)    neuropathy   Neuropathy    Pneumonia    childhood   Seasonal allergies    Seizures (HCC)    as a child, none since age 60; sister had them too (12/14/2017)   Type II diabetes mellitus (HCC)      Past Surgical History:  Procedure Laterality Date   APPENDECTOMY  12/14/2017   CHOLECYSTECTOMY  05/29/2012   Procedure: LAPAROSCOPIC CHOLECYSTECTOMY WITH INTRAOPERATIVE CHOLANGIOGRAM;  Surgeon: Donnice POUR. Belinda, MD;  Location: [facility];  Service: General;  Laterality: N/A;   JOINT REPLACEMENT     LAPAROSCOPIC APPENDECTOMY N/A 12/14/2017   Procedure: APPENDECTOMY LAPAROSCOPIC;  Surgeon: Teresa Lonni HERO, MD;  Location: [facility];  Service: General;  Laterality: N/A;   TONSILLECTOMY  1973   TOTAL HIP ARTHROPLASTY Bilateral 01/2005-08/2005   left-right     Family History  Problem Relation Age of Onset   Cancer Mother        breast   Cancer Maternal Grandmother        melanoma-nose   Cancer Paternal Grandfather        melanoma-ear   Colon cancer Neg Hx    Esophageal cancer Neg Hx    Rectal cancer Neg Hx    Stomach cancer Neg Hx     Social History[3]  ROS Refer to HPI for ROS details.  Objective:    Vitals: BP 129/85 (BP Location: Right Arm)   Pulse 94   Temp 98.8 F (37.1 C) (Oral)   Resp 17   Ht 5' 10 (1.778 m)   Wt 128 lb (58.1 kg)   SpO2 95%   BMI 18.37 kg/m   Physical Exam Vitals and nursing note reviewed.  Constitutional:      General: He is not in acute distress.    Appearance: He is well-developed. He is not ill-appearing or toxic-appearing.  HENT:     Head: Normocephalic.     Nose: Congestion present. No rhinorrhea.     Mouth/Throat:     Mouth: Mucous membranes are moist.  Cardiovascular:     Rate and Rhythm: Normal rate.  Pulmonary:     Effort: Pulmonary effort is normal. No respiratory distress.     Breath sounds: No stridor. No wheezing.  Chest:     Chest wall: No tenderness.  Skin:    General: Skin is warm  and dry.  Neurological:     General: No focal deficit present.     Mental Status: He is alert and oriented to person, place, and time.  Psychiatric:        Mood and Affect: Mood normal.        Behavior: Behavior normal.     Procedures  No results found for this or any previous visit (from the past 24 hours).  Assessment and Plan :     Discharge Instructions       1. Acute viral syndrome (Primary) - azelastine  (ASTELIN ) 0.1 % nasal spray; Place 1 spray into both nostrils 2 (two) times daily. Use in each nostril as directed  Dispense: 30 mL; Refill: 1 - promethazine -dextromethorphan (PROMETHAZINE -DM) 6.25-15 MG/5ML syrup; Take 10 mLs by mouth 3 (three) times daily as needed.  Dispense: 240 mL; Refill: 0 - ibuprofen  (ADVIL ) 800 MG tablet; Take 1 tablet (800 mg total) by mouth  3 (three) times daily.  Dispense: 30 tablet; Refill: 0 - No viral testing performed today in UC due to onset of symptoms 5 days ago and testing would not change course of therapy.  Recommend symptomatic management. - Based on reported symptoms influenza is the most likely cause of viral URI.  -Continue to monitor symptoms for any change in severity if there is any escalation of current symptoms or development of new symptoms follow-up in ER for further evaluation and management.       Quadry Kampa B Kalena Mander    [1] No current facility-administered medications for this encounter.  Current Outpatient Medications:    aspirin  81 MG tablet, Take 81 mg by mouth daily., Disp: , Rfl:    atorvastatin  (LIPITOR) 20 MG tablet, Take 0.5 tablets (10 mg total) by mouth daily., Disp: 90 tablet, Rfl: 0   azelastine  (ASTELIN ) 0.1 % nasal spray, Place 1 spray into both nostrils 2 (two) times daily. Use in each nostril as directed, Disp: 30 mL, Rfl: 1   BD PEN NEEDLE NANO U/F 32G X 4 MM MISC, USE 1 NEEDLE TO INJECT 24 UNITS INTO THE SKIN DAILY., Disp: 100 each, Rfl: 3   bictegravir-emtricitabine-tenofovir  AF (BIKTARVY ) 50-200-25 MG TABS tablet, Take 1 tablet by mouth daily., Disp: 30 tablet, Rfl: 11   buPROPion  (WELLBUTRIN  XL) 300 MG 24 hr tablet, TAKE 1 TABLET BY MOUTH EVERY DAY, Disp: 90 tablet, Rfl: 0   Continuous Glucose Sensor (FREESTYLE LIBRE 3 PLUS SENSOR) MISC, by Does not apply route. Change sensor every 15 days., Disp: , Rfl:    fenofibrate  160 MG tablet, TAKE 1 TABLET BY MOUTH EVERY DAY, Disp: 90 tablet, Rfl: 1   fluticasone  (FLONASE ) 50 MCG/ACT nasal spray, PLACE 1 SPRAY INTO BOTH NOSTRILS 2 (TWO) TIMES DAILY, Disp: 48 mL, Rfl: 1   Glucagon  (GVOKE HYPOPEN  2-PACK) 1 MG/0.2ML SOAJ, Inject 1 pen into the skin as needed., Disp: , Rfl:    Glucagon , rDNA, (GLUCAGON  EMERGENCY) 1 MG KIT, Inject 1 mg as directed as needed for up to 2 doses (administer when CBG is less than 55 and repeat x1 in 15 minutes if  needed)., Disp: 1 kit, Rfl: 2   glucose blood test strip, 1 each by Other route 3 (three) times daily as needed. Use as directed., Disp: , Rfl:    ibuprofen  (ADVIL ) 800 MG tablet, Take 1 tablet (800 mg total) by mouth 3 (three) times daily., Disp: 30 tablet, Rfl: 0   insulin  glargine, 1 Unit Dial, (TOUJEO  SOLOSTAR) 300 UNIT/ML Solostar Pen, 12 units, Disp: ,  Rfl:    insulin  lispro (HUMALOG KWIKPEN) 200 UNIT/ML KwikPen, 4 units+correctional, Disp: , Rfl:    JARDIANCE  25 MG TABS tablet, Take 25 mg by mouth daily., Disp: , Rfl:    lisinopril  (ZESTRIL ) 10 MG tablet, Take 0.5 tablets (5 mg total) by mouth daily., Disp: 90 tablet, Rfl: 0   loratadine  (CLARITIN ) 10 MG tablet, Take 10 mg by mouth daily., Disp: , Rfl:    magnesium oxide (MAG-OX) 400 MG tablet, Take 400 mg by mouth daily., Disp: , Rfl:    Menthol 5 % PTCH, Apply 1 patch topically as needed (knee pain)., Disp: , Rfl:    mineral oil-hydrophilic petrolatum (AQUAPHOR) ointment, Apply topically as needed for dry skin., Disp: 420 g, Rfl: 0   MOUNJARO 2.5 MG/0.5ML Pen, Inject 2.5 mg into the skin once a week., Disp: , Rfl:    MOUNJARO 5 MG/0.5ML Pen, INJECT 5 MG SUBCUTANEOUS ONCE WEEKLY 30 DAYS, Disp: , Rfl:    Multiple Vitamin (MULTIVITAMIN WITH MINERALS) TABS tablet, Take 1 tablet by mouth daily., Disp: , Rfl:    ondansetron  (ZOFRAN -ODT) 4 MG disintegrating tablet, Take 1 tablet (4 mg total) by mouth every 8 (eight) hours as needed for nausea or vomiting., Disp: 20 tablet, Rfl: 0   pantoprazole  (PROTONIX ) 40 MG tablet, TAKE 1 TABLET BY MOUTH EVERY DAY, Disp: 90 tablet, Rfl: 1   Probiotic Product (PROBIOTIC DAILY PO), Take by mouth., Disp: , Rfl:    promethazine -dextromethorphan (PROMETHAZINE -DM) 6.25-15 MG/5ML syrup, Take 10 mLs by mouth 3 (three) times daily as needed., Disp: 240 mL, Rfl: 0   sucralfate  (CARAFATE ) 1 g tablet, Take 1 tablet (1 g total) by mouth 4 (four) times daily -  with meals and at bedtime., Disp: 40 tablet, Rfl: 0    tadalafil  (CIALIS ) 20 MG tablet, TAKE 1/2 TO 1 TABLET BY MOUTH AS NEEDED AS DIRECTED, Disp: 6 tablet, Rfl: 8 [2]  Allergies Allergen Reactions   Phenytoin Other (See Comments)    Childhood pt unsure Other reaction(s): Unknown   Metformin And Related Nausea And Vomiting   Metformin Hcl Other (See Comments)    Other reaction(s): Upset Stomach   Other Other (See Comments)  [3]  Social History Tobacco Use   Smoking status: Former    Current packs/day: 0.00    Average packs/day: 0.8 packs/day for 10.0 years (8.0 ttl pk-yrs)    Types: Cigarettes    Start date: 05/06/1996    Quit date: 05/06/2006    Years since quitting: 18.5    Passive exposure: Past (parents and grandparents smoked in home car he moved out age 23)   Smokeless tobacco: Never  Vaping Use   Vaping status: Never Used  Substance Use Topics   Alcohol use: Yes    Alcohol/week: 2.0 standard drinks of alcohol    Types: 2 Cans of beer per week   Drug use: Not Currently    Types: Marijuana    Comment: 12/14/2017 'last used 1 wk ago     Aurea Goodell B, NP 11/02/24 1257  "

## 2024-11-02 NOTE — Discharge Instructions (Addendum)
" °  1. Acute viral syndrome (Primary) - azelastine  (ASTELIN ) 0.1 % nasal spray; Place 1 spray into both nostrils 2 (two) times daily. Use in each nostril as directed  Dispense: 30 mL; Refill: 1 - promethazine -dextromethorphan (PROMETHAZINE -DM) 6.25-15 MG/5ML syrup; Take 10 mLs by mouth 3 (three) times daily as needed.  Dispense: 240 mL; Refill: 0 - ibuprofen  (ADVIL ) 800 MG tablet; Take 1 tablet (800 mg total) by mouth 3 (three) times daily.  Dispense: 30 tablet; Refill: 0 - No viral testing performed today in UC due to onset of symptoms 5 days ago and testing would not change course of therapy.  Recommend symptomatic management. - Based on reported symptoms influenza is the most likely cause of viral URI.  -Continue to monitor symptoms for any change in severity if there is any escalation of current symptoms or development of new symptoms follow-up in ER for further evaluation and management. "

## 2024-11-03 NOTE — Telephone Encounter (Signed)
 Notified by supervisor patient called out sick again today.  Left message for patient to contact NP call kept failing on patient call backs earlier in the day.  Call connected at [phone] and patient stated fatigued, congestion/rhinitis continue not feeling any better yet low appetite able to tolerate po intake without difficulty blood sugar 100s on CGM A&Ox3 spoke full sentences without difficulty not scheduled to work this weekend.  Duration of call 6 minutes.  Patient was seen by UC provider today and told viral URI.  Congestion nasal and intermittent cough audible.  Discussed continue hydration and avoid skipping meals bland diet.  Nasal saline 2 sprays q2h each nostril wa as needed congestion, flonase  nasal 1 spray each nostril BID  May continue use mucinex po BID OTC  Patient to notify NP if mucous becomes brown/pink/blood or opaquae or dyspnea/fever.  Patient agreed with plan of care and had no further questions at this time.  HR and supervisor team notified excused absence extended 12/26 re-eval Sunday via telephone.

## 2024-11-04 NOTE — Telephone Encounter (Signed)
 Spoke with patient via telephone still some congestion and head pressure but having more energy and didn't need nap yesterday or today.  Appetite still depressed but increasing po intake.  Feels able to perform work duties tomorrow.  Denied fever/chills/n/v/d or new symptoms in the past 48 hours.  Cleared to return onsite tomorrow.  HR team and supervisor notified.  Patient spoke full sentences without difficulty no audible cough/throat clearing during 2 minute call.  Patient notified RN Karene onsite 8a-5p and NP in clinic Tuesday 9-12.  Patient to notify clinic staff if opaque/bloody/tan or pink mucous this week or dyspnea/new symptoms.  Patient agreed with plan of care and had no further questions at this time.

## 2024-11-05 ENCOUNTER — Ambulatory Visit: Admitting: General Practice

## 2024-11-05 VITALS — BP 128/69 | HR 110 | Temp 97.8°F | Resp 18

## 2024-11-05 DIAGNOSIS — R058 Other specified cough: Secondary | ICD-10-CM

## 2024-11-05 NOTE — Telephone Encounter (Signed)
 Patient returned to work as expected but merchandiser, retail concerned about patient and was sent for evaluation by RN Karene in clinic 11/05/24

## 2024-11-05 NOTE — Progress Notes (Signed)
 Frederick Abbott presents to the clinic this am with continued symptoms of a URI. He reports feeling short of breath with continued cough and yellow colored phlegm. He also states blowing brown colored mucus from his nose. He has been able to tolerate some foods and liquids but remains to have a low appetite. He took Dayquil before coming to work this am and is wearing a mask. Reports taking a COVID test with a negative result at Urgent Care. States he's been sick since a week ago (12/21).   Upon RN assessment, Frederick Abbott is short of breath when walking into the clinic. See VS, tachycardic and appears weak in stature with a pale skin tone. Lungs auscultated, minimal rales/crackles heard in the posterior RLL. Encouraged him to continue wearing a mask while he has a cough. Provided Medi-Mucus x 2 packets. Advised to take 1 pill at 10:30 am and take the other pill at 3 pm to help bring up secretions as well as continue to drink water.  Due to his immunocompromised physical state and respiratory symptoms, RN recommended he return home and not work today. Will consult with clinic NP, T. Betancourt and follow up.

## 2024-11-06 MED ORDER — AMOXICILLIN 875 MG PO TABS: 875.0000 mg | ORAL_TABLET | Freq: Two times a day (BID) | ORAL | 0 refills | Status: AC

## 2024-11-06 NOTE — Telephone Encounter (Unsigned)
 Patient reported mucous from nose yesterday brown and today grey.  Using nasal saline and feels like mucous moving more and congestion slightly improved but still no appetite and weight 125.7lbs this am.  Was 160lbs prior to starting mounjarno and has follow up appt scheduled with [facility] for Monday 12 Nov 2024  He stated he is going to discuss weight loss and decreased appetite with his PCM at appt.  Patient had further weight loss this week and last week due to viral URI completely lost appetite.  Patient A&Ox3 spoke full sentences without difficulty audible congestion nasal and throat clearing during 5 minute call.  Is able to tolerate po intake but having to force self to eat.  Fatigued.  Urinating without difficulty.  Discussed with patient to start amoxicillin  875mg  po BID #20 RF0 x 10 days as suspect sinusitis at this time bacterial due to cloudy gray/brown mucous.  Continue nasal saline rinse and start tessalon  pearles 200mg  po TID prn cough chew whole.  Patient did not want new rx as has some at home at this time.  Patient denied fever/chills.  Fatigue and shortness of breath with exertion main problem.  Discussed with patient to use albuterol  inhaler 2 puffs po q4h prn protracted cough/wheezing/chest tightness at home.  Notify clinic staff if new or worsening symptoms.   RN Karene in clinic tomorrow.  Supervisor and HR team notified excused absence extended 11/06/24 and re-evaluation tomorrow.

## 2024-11-07 ENCOUNTER — Telehealth: Payer: Self-pay | Admitting: General Practice

## 2024-11-07 NOTE — Telephone Encounter (Signed)
 RN placed a follow up call to [place] this afternoon. Per [place], he returned to work this morning and has done fairly well all day. He is about to get off work and is looking forward to resting at home. The business is closed tomorrow and he has been approved to take a PTO day on Friday, giving him 4 more days of rest and recovery. He states slowly feeling better each day. RN advised if needs arise to email or call the clinic in the next 4 days. No further needs at this time.

## 2024-11-08 NOTE — Telephone Encounter (Signed)
 Patient reported worked an entire shift yesterday and put in PTO request for tomorrow so next shift 12 Nov 2024.  Feeling a bit better but not 100% still.  Plans to rest, increase po intake to build up strength/reserves again

## 2024-11-12 ENCOUNTER — Encounter: Payer: Self-pay | Admitting: Family Medicine

## 2024-11-12 ENCOUNTER — Ambulatory Visit: Admitting: Family Medicine

## 2024-11-12 VITALS — BP 98/65 | HR 91 | Temp 98.1°F | Wt 136.4 lb

## 2024-11-12 DIAGNOSIS — R634 Abnormal weight loss: Secondary | ICD-10-CM | POA: Diagnosis not present

## 2024-11-12 LAB — CBC WITH DIFFERENTIAL/PLATELET
Basophils Absolute: 0.1 K/uL (ref 0.0–0.1)
Basophils Relative: 0.9 % (ref 0.0–3.0)
Eosinophils Absolute: 0.3 K/uL (ref 0.0–0.7)
Eosinophils Relative: 2.7 % (ref 0.0–5.0)
HCT: 42.1 % (ref 39.0–52.0)
Hemoglobin: 14.2 g/dL (ref 13.0–17.0)
Lymphocytes Relative: 21.7 % (ref 12.0–46.0)
Lymphs Abs: 2.3 K/uL (ref 0.7–4.0)
MCHC: 33.7 g/dL (ref 30.0–36.0)
MCV: 90.6 fl (ref 78.0–100.0)
Monocytes Absolute: 0.9 K/uL (ref 0.1–1.0)
Monocytes Relative: 8.4 % (ref 3.0–12.0)
Neutro Abs: 7.2 K/uL (ref 1.4–7.7)
Neutrophils Relative %: 66.3 % (ref 43.0–77.0)
Platelets: 590 K/uL — ABNORMAL HIGH (ref 150.0–400.0)
RBC: 4.65 Mil/uL (ref 4.22–5.81)
RDW: 13.1 % (ref 11.5–15.5)
WBC: 10.8 K/uL — ABNORMAL HIGH (ref 4.0–10.5)

## 2024-11-12 LAB — BASIC METABOLIC PANEL WITH GFR
BUN: 16 mg/dL (ref 6–23)
CO2: 28 meq/L (ref 19–32)
Calcium: 9.4 mg/dL (ref 8.4–10.5)
Chloride: 103 meq/L (ref 96–112)
Creatinine, Ser: 1 mg/dL (ref 0.40–1.50)
GFR: 85.18 mL/min
Glucose, Bld: 132 mg/dL — ABNORMAL HIGH (ref 70–99)
Potassium: 4.8 meq/L (ref 3.5–5.1)
Sodium: 139 meq/L (ref 135–145)

## 2024-11-12 LAB — HEPATIC FUNCTION PANEL
ALT: 26 U/L (ref 3–53)
AST: 17 U/L (ref 5–37)
Albumin: 4.1 g/dL (ref 3.5–5.2)
Alkaline Phosphatase: 68 U/L (ref 39–117)
Bilirubin, Direct: 0.1 mg/dL (ref 0.1–0.3)
Total Bilirubin: 0.4 mg/dL (ref 0.2–1.2)
Total Protein: 7 g/dL (ref 6.0–8.3)

## 2024-11-12 LAB — TSH: TSH: 1.48 u[IU]/mL (ref 0.35–5.50)

## 2024-11-12 NOTE — Progress Notes (Signed)
" ° °  Subjective:    Patient ID: Frederick Abbott, male    DOB: [birth year], 54 y.o.   MRN: [id number]  HPI Weight loss- pt was 143 on 10/15 and dropped down as low as 128 at [facility] on 12/26.  That is 15 lbs in less than 3 months.  Today weight is 136 and BMI now 19.57.  Stopped Mounjaro 12/27.  Appetite is coming back.  He liked the [facility] b/c it stopped sugar cravings.  Also on Jardiance .  Has not taken any insulin  (including basal insulin ) in 4-5 months.  Saw Endo in late Sept and has appt upcoming in Feb.  Will have protein shakes or protein bars at work to eat when he is not hungry.     Review of Systems For ROS see HPI     Objective:   Physical Exam Vitals reviewed.  Constitutional:      General: He is not in acute distress.    Appearance: Normal appearance. He is not ill-appearing.     Comments: Very thin  HENT:     Head: Normocephalic and atraumatic.  Cardiovascular:     Rate and Rhythm: Normal rate and regular rhythm.     Heart sounds: Normal heart sounds.  Pulmonary:     Effort: Pulmonary effort is normal. No respiratory distress.     Breath sounds: No wheezing or rhonchi.  Skin:    General: Skin is warm and dry.  Neurological:     General: No focal deficit present.     Mental Status: He is alert and oriented to person, place, and time.  Psychiatric:        Mood and Affect: Mood normal.        Behavior: Behavior normal.        Thought Content: Thought content normal.           Assessment & Plan:  Unintentional weight loss- new.  Pt was around 165 lbs prior to starting Mounjaro.  While taking the medication was fairly steady around 145 lbs until his recently got sick.  He then dropped down to 125 lb.  Today is up to 136 but BMI is still low at 19.57.  Stressed the need to remain off the Mounjaro and debated stopping the Jardiance  as this can also cause weight loss.  For now will continue Jardiance  but he is to monitor his weight and let me know if it goes down.   Encouraged him to eat regularly, increase his protein intake.  Suggested he discuss other diabetes medications w/ Endo that would not cause weight loss.  Check labs to assess electrolytes, possible infection, thyroid  abnormality.  Will follow.  "

## 2024-11-12 NOTE — Patient Instructions (Signed)
 Follow up as needed or as scheduled We'll notify you of your lab results and make any changes  Continue to [facility] Make sure you are eating regularly- you need the calories! Call with any questions or concerns Stay Safe!  Continue to get healthy! Happy New Year!!

## 2024-11-13 MED ORDER — ATORVASTATIN CALCIUM 20 MG PO TABS: 10.0000 mg | ORAL_TABLET | Freq: Every day | ORAL | 1 refills | Status: AC

## 2024-11-13 MED ORDER — LISINOPRIL 10 MG PO TABS: 5.0000 mg | ORAL_TABLET | Freq: Every day | ORAL | Status: AC

## 2024-11-13 NOTE — Telephone Encounter (Signed)
 Patient seen in clinic today.  Stated working without difficulty yesterday and today.  Had a fall this weekend trying to help family member get up stairs and family member weak lost balance and fell on him on steps outside house and scraped left hip on concrete  Applying honey to scraped up skin and leaving open at night and covering at work  Family member had been in hospital last week with sepsis and they were trying to celebrate Christmas at patient house this weekend but ended up going to their house which didn't have steps.  A&Ox3 spoke full sentences without difficulty gait sure and stead respirations even and unlabored tolerating po intake without difficulty skin warm dry and pink arms/calves/face anterior visual inspection  Patient to follow up with provider if symptoms of infection to abrasion e.g. discharge/swelling/worsening pain.  Patient requested refill  atorvastatin  20mg  and lisinopril  10mg  take 1/2 tab po daily #90 RF0 last dispensed from [facility] 05/17/2024 90 tabs.  Last labs   Latest Reference Range & Units 08/22/24 08:13  Total CHOL/HDL Ratio  3  Cholesterol 0 - 200 mg/dL 859  HDL Cholesterol >60.99 mg/dL 52.89  LDL (calc) 0 - 99 mg/dL 70  NonHDL  07.14  Triglycerides 0.0 - 149.0 mg/dL 885.9  VLDL 0.0 - 59.9 mg/dL 77.1    Latest Reference Range & Units 11/12/24 10:52  Basic metabolic panel with GFR  Rpt !  Sodium 135 - 145 mEq/L 139  Potassium 3.5 - 5.1 mEq/L 4.8  Chloride 96 - 112 mEq/L 103  CO2 19 - 32 mEq/L 28  Glucose 70 - 99 mg/dL 867 (H)  BUN 6 - 23 mg/dL 16  Creatinine 9.59 - 8.49 mg/dL 8.99  Calcium  8.4 - 10.5 mg/dL 9.4  Alkaline Phosphatase 39 - 117 U/L 68  Albumin 3.5 - 5.2 g/dL 4.1  AST 5 - 37 U/L 17  ALT 3 - 53 U/L 26  Total Protein 6.0 - 8.3 g/dL 7.0  Bilirubin, Direct 0.1 - 0.3 mg/dL 0.1  Total Bilirubin 0.2 - 1.2 mg/dL 0.4  GFR >39.99 mL/min 85.18  WBC 4.0 - 10.5 K/uL 10.8 (H)  RBC 4.22 - 5.81 Mil/uL 4.65  Hemoglobin 13.0 - 17.0 g/dL 85.7  HCT 60.9 - 47.9  % 42.1  MCV 78.0 - 100.0 fl 90.6  MCHC 30.0 - 36.0 g/dL 66.2  RDW 88.4 - 84.4 % 13.1  Platelets 150.0 - 400.0 K/uL 590.0 (H)  Neutrophils 43.0 - 77.0 % 66.3  Lymphocytes 12.0 - 46.0 % 21.7  Monocytes Relative 3.0 - 12.0 % 8.4  Eosinophil 0.0 - 5.0 % 2.7  Basophil 0.0 - 3.0 % 0.9  NEUT# 1.4 - 7.7 K/uL 7.2  Lymphs Abs 0.7 - 4.0 K/uL 2.3  Monocyte # 0.1 - 1.0 K/uL 0.9  Eosinophils Absolute 0.0 - 0.7 K/uL 0.3  Basophils Absolute 0.0 - 0.1 K/uL 0.1  TSH 0.35 - 5.50 uIU/mL 1.48  !: Data is abnormal (H): Data is abnormally high Rpt: View report in Results Review for more information   BP 98/65 (BP Location: Left Arm, Patient Position: Sitting, Cuff Size: Normal)   Pulse 91   Temp 98.1 F (36.7 C) (Oral)   Wt 136 lb 6.4 oz (61.9 kg)   SpO2 97%   BMI 19.57 kg/m   BSA 1.75 m  Patient agreed with plan of care and had no further questions at this time.

## 2024-11-15 ENCOUNTER — Other Ambulatory Visit: Payer: Self-pay | Admitting: Family Medicine

## 2024-11-15 ENCOUNTER — Ambulatory Visit: Payer: Self-pay | Admitting: Family Medicine

## 2024-11-15 NOTE — Progress Notes (Signed)
 Patient has appt with end 12/19/24 and I will send over lab results to that office

## 2024-11-21 ENCOUNTER — Other Ambulatory Visit: Payer: Self-pay | Admitting: General Practice

## 2024-11-21 ENCOUNTER — Ambulatory Visit: Admitting: General Practice

## 2024-11-21 VITALS — BP 139/85 | HR 84 | Temp 97.8°F | Resp 18

## 2024-11-21 DIAGNOSIS — E119 Type 2 diabetes mellitus without complications: Secondary | ICD-10-CM

## 2024-11-21 DIAGNOSIS — S0990XA Unspecified injury of head, initial encounter: Secondary | ICD-10-CM | POA: Insufficient documentation

## 2024-11-21 NOTE — Progress Notes (Signed)
 Medford presents to the clinic this am with c/o of a head injury. While bending to get some items, he stood up, misjudged the area and hit his left side of head on a metal shelf. He denies any headache (other than present site hit), any vision issues, no weakness or nausea.   VSS; RN assessed the top and left side of his head, carefully parting hair to visualize his scalp. No broken skin, redness, bumps or bruising noted. Gait is steady, able to answer questions appropriately. Recommended if he has generalized headache or any other s/s develop, come back to the clinic for other treatments. Denied the need for pain medicine at this time.

## 2024-12-06 ENCOUNTER — Other Ambulatory Visit: Payer: Self-pay | Admitting: Registered Nurse

## 2024-12-06 ENCOUNTER — Other Ambulatory Visit: Admitting: General Practice

## 2024-12-06 NOTE — Progress Notes (Signed)
 Medford presents to the clinic this am for Preventative Health Care/Be Well assessment. He discussed being able to gain weight back from prior illnesses. Lab Work drawn and will be sent to [facility] this afternoon. Results will be sent to J. Roy, NP.

## 2024-12-07 ENCOUNTER — Telehealth: Payer: Self-pay | Admitting: Registered Nurse

## 2024-12-07 ENCOUNTER — Encounter: Payer: Self-pay | Admitting: Registered Nurse

## 2024-12-07 DIAGNOSIS — Z0000 Encounter for general adult medical examination without abnormal findings: Secondary | ICD-10-CM

## 2024-12-07 LAB — HEMOGLOBIN A1C
Est. average glucose Bld gHb Est-mCnc: 134 mg/dL
Hgb A1c MFr Bld: 6.3 % — ABNORMAL HIGH (ref 4.8–5.6)

## 2024-12-07 LAB — CMP12+LP+6AC
ALT: 30 [IU]/L (ref 0–44)
AST: 25 [IU]/L (ref 0–40)
Albumin: 4.6 g/dL (ref 3.8–4.9)
Alkaline Phosphatase: 76 [IU]/L (ref 47–123)
BUN/Creatinine Ratio: 13 (ref 9–20)
BUN: 14 mg/dL (ref 6–24)
Bilirubin Total: 0.4 mg/dL (ref 0.0–1.2)
Calcium: 9.4 mg/dL (ref 8.7–10.2)
Chloride: 107 mmol/L — ABNORMAL HIGH (ref 96–106)
Chol/HDL Ratio: 3.3 ratio (ref 0.0–5.0)
Cholesterol, Total: 157 mg/dL (ref 100–199)
Creatinine, Ser: 1.04 mg/dL (ref 0.76–1.27)
GGT: 39 [IU]/L (ref 0–65)
Globulin, Total: 2 g/dL (ref 1.5–4.5)
Glucose: 144 mg/dL — ABNORMAL HIGH (ref 70–99)
HDL: 48 mg/dL
Iron: 98 ug/dL (ref 38–169)
LDH: 98 [IU]/L — ABNORMAL LOW (ref 121–224)
LDL Chol Calc (NIH): 87 mg/dL (ref 0–99)
Phosphorus: 3.6 mg/dL (ref 2.8–4.1)
Potassium: 4.1 mmol/L (ref 3.5–5.2)
Sodium: 142 mmol/L (ref 134–144)
Total Protein: 6.6 g/dL (ref 6.0–8.5)
Triglycerides: 124 mg/dL (ref 0–149)
Uric Acid: 3.5 mg/dL — ABNORMAL LOW (ref 3.8–8.4)
VLDL Cholesterol Cal: 22 mg/dL (ref 5–40)
eGFR: 85 mL/min/{1.73_m2}

## 2024-12-07 LAB — MICROALBUMIN / CREATININE URINE RATIO
Creatinine, Urine: 74.6 mg/dL
Microalb/Creat Ratio: 8 mg/g{creat} (ref 0–29)
Microalbumin, Urine: 5.7 ug/mL

## 2024-12-07 NOTE — Telephone Encounter (Signed)
 Patient saw RN Karene yesterday for [facility] labs but would also like to use them for Be Well 2027 insurance discount.  Results this am Hgba1c 6.3 LDL 87  Patient met requirements for insurance discount starting 08 Aug 2026.  Provider portion of Be Well paperwork signed 12/06/24 by NP.  Noted that patient has had 10lb weight loss since last Be Well appt.  He is working with PCM to avoid further weight loss and discussing medication changes.   My chart message sent to patient met requirements for insurance discount along with test results.   132 High   216 High   107 High      Uric Acid 3.5 Low          Comment:            Therapeutic target for gout patients: <6.0  BUN 14  16 R  18 R  15   Creatinine, Ser 1.04  1.00 R  1.30 R  1.13   eGFR 85      77   BUN/Creatinine Ratio 13      13   Sodium 142  139 R  140 R  142   Potassium 4.1  4.8 R  4.1 R  4.2   Chloride 107 High   103 R  103 R  104   Calcium  9.4  9.4 R  9.2 R  10.0   Phosphorus 3.6         Total Protein 6.6 7.0 R  6.7 R   6.9   Albumin 4.6 4.1 R  4.7 R   4.9   Globulin, Total 2.0      2.0   Bilirubin Total 0.4 0.4 R  0.7 R   0.5   Alkaline Phosphatase 76 68 R  49 R   65 CM   LDH 98 Low          AST 25 17 R  24 R   25   ALT 30 26 R  25 R   25   GGT 39         Iron 98       86  Cholesterol, Total 157         Triglycerides 124     114.0 R, CM    HDL 48     47.10 R    VLDL Cholesterol Cal 22         LDL Chol Calc (NIH) 87         Chol/HDL Ratio 3.3     3 R, CM    Comment:                                   T. Chol/HDL Ratio                                             Men  Women                               1/2 Avg.Risk  3.4    3.3  Avg.Risk  5.0    4.4                                2X Avg.Risk  9.6    7.1                                3X Avg.Risk 23.4   11.0  Estimated CHD Risk  < 0.5         Comment: The CHD Risk is based on the T. Chol/HDL ratio. Other factors affect CHD Risk such as  hypertension, smoking, diabetes, severe obesity, and family history of premature CHD.  Resulting Agency [facility] [place] [facility] [place] [facility] [place] [facility] [place] [facility]  [facility]         Narrative Performed by: HOYT Performed at:  [address] [facility] [address], [place], [place]  [id number] Lab Director: Frankey Sas MD, Phone:  [phone]  Specimen Collected: 12/06/24 00:00 Last Resulted: 12/07/24 07:36

## 2024-12-10 ENCOUNTER — Ambulatory Visit: Payer: Self-pay | Admitting: Registered Nurse

## 2024-12-10 LAB — CMP12+LP+TP+TSH+6AC+PSA+CBC…

## 2024-12-10 LAB — HGB A1C W/O EAG

## 2024-12-11 LAB — CMP12+LP+TP+TSH+6AC+PSA+CBC…
ALT: 29 [IU]/L (ref 0–44)
AST: 27 [IU]/L (ref 0–40)
Albumin: 4.5 g/dL (ref 3.8–4.9)
Alkaline Phosphatase: 79 [IU]/L (ref 47–123)
BUN/Creatinine Ratio: 12 (ref 9–20)
BUN: 14 mg/dL (ref 6–24)
Bilirubin Total: <0.2 mg/dL (ref 0.0–1.2)
Calcium: 9.7 mg/dL (ref 8.7–10.2)
Chloride: 107 mmol/L — AB (ref 96–106)
Cholesterol, Total: 172 mg/dL (ref 100–199)
Creatinine, Ser: 1.18 mg/dL (ref 0.76–1.27)
Estimated CHD Risk: 0.6 times avg. (ref 0.0–1.0)
GGT: 38 [IU]/L (ref 0–65)
Globulin, Total: 2.2 g/dL (ref 1.5–4.5)
Glucose: 145 mg/dL — AB (ref 70–99)
HDL: 48 mg/dL
Iron: 98 ug/dL (ref 38–169)
LDH: 85 [IU]/L — AB (ref 121–224)
LDL CALC COMMENT:: 3.6 ratio (ref 0.0–5.0)
LDL Chol Calc (NIH): 101 mg/dL — AB (ref 0–99)
Phosphorus: 3.9 mg/dL (ref 2.8–4.1)
Potassium: 4.1 mmol/L (ref 3.5–5.2)
Prostate Specific Ag, Serum: 0.5 ng/mL (ref 0.0–4.0)
Sodium: 142 mmol/L (ref 134–144)
T3 Uptake Ratio: 2.2 (ref 1.2–4.9)
T4, Total: 28 (ref 24–39)
TSH: 2.89 u[IU]/mL (ref 0.450–4.500)
TSH: 7.9 ug/dL (ref 4.5–12.0)
Total Protein: 6.7 g/dL (ref 6.0–8.5)
Triglycerides: 131 mg/dL (ref 0–149)
Uric Acid: 3.7 mg/dL — AB (ref 3.8–8.4)
VLDL Cholesterol Cal: 23 mg/dL (ref 5–40)
eGFR: 73 mL/min/{1.73_m2}

## 2024-12-11 LAB — SPECIMEN STATUS REPORT

## 2024-12-13 ENCOUNTER — Ambulatory Visit: Admitting: Registered Nurse

## 2024-12-13 ENCOUNTER — Encounter: Payer: Self-pay | Admitting: Registered Nurse

## 2024-12-13 VITALS — Resp 16 | Wt 145.0 lb

## 2024-12-13 DIAGNOSIS — L84 Corns and callosities: Secondary | ICD-10-CM

## 2024-12-13 NOTE — Progress Notes (Signed)
 "  Established Patient Office Visit  Subjective   Patient ID: Frederick Abbott, male    DOB: [birth year]  Age: 54 y.o. MRN: [id number]  Chief Complaint  Patient presents with   left foot toe pain    Hurts when walking I think I need you to shave between toes again    53y/o caucasian established male here for evaluation foot lesion history corns between toes causing pain again would like reshave performed  Initially treated 2023 with keflex  and bactrim  as cellulitis also developed at that time and had podiatry evaluation.  Patient has seen his lab results note and no further questions at this time and has endocrinology appt scheduled next week.  I have also started to gain some of the weight I lost during viral illness.      Review of Systems  Constitutional:  Negative for chills, diaphoresis, fever, malaise/fatigue and weight loss.  Skin:  Positive for rash. Negative for itching.  Neurological:  Negative for tingling, tremors and sensory change.      Objective:     Resp 16   Wt 145 lb (65.8 kg)   BMI 20.81 kg/m    Physical Exam Vitals and nursing note reviewed.  Constitutional:      General: He is awake. He is not in acute distress.    Appearance: Normal appearance. He is well-developed, well-groomed and underweight. He is not ill-appearing, toxic-appearing or diaphoretic.  HENT:     Head: Normocephalic and atraumatic.     Jaw: There is normal jaw occlusion.     Salivary Glands: Right salivary gland is not diffusely enlarged. Left salivary gland is not diffusely enlarged.     Right Ear: Hearing and external ear normal.     Left Ear: Hearing and external ear normal.     Nose: Nose normal. No congestion or rhinorrhea.     Mouth/Throat:     Lips: Pink. No lesions.     Mouth: Mucous membranes are moist.     Pharynx: Oropharynx is clear.  Eyes:     General: Lids are normal. Vision grossly intact. Gaze aligned appropriately. Allergic shiner present. No scleral  icterus.       Right eye: No discharge.        Left eye: No discharge.     Extraocular Movements: Extraocular movements intact.     Conjunctiva/sclera: Conjunctivae normal.     Pupils: Pupils are equal, round, and reactive to light.  Neck:     Trachea: Trachea normal.  Cardiovascular:     Rate and Rhythm: Normal rate and regular rhythm.     Pulses: Normal pulses.          Radial pulses are 2+ on the right side and 2+ on the left side.  Pulmonary:     Effort: Pulmonary effort is normal.     Breath sounds: Normal breath sounds and air entry. No stridor or transmitted upper airway sounds. No wheezing.     Comments: Spoke full sentences without difficulty; no cough observed in exam room Abdominal:     General: Abdomen is flat.  Musculoskeletal:        General: Tenderness present. No swelling or signs of injury. Normal range of motion.     Right hand: Normal strength. Normal capillary refill.     Left hand: Normal strength. Normal capillary refill.     Cervical back: Normal range of motion and neck supple. No swelling, edema, deformity, erythema, signs of trauma, lacerations, rigidity, torticollis or  crepitus. No pain with movement. Normal range of motion.     Right lower leg: No edema.     Left lower leg: No edema.     Right foot: Normal range of motion. No deformity, bunion, Charcot foot or foot drop.     Left foot: Normal range of motion. No deformity, bunion, Charcot foot or foot drop.       Feet:  Feet:     Right foot:     Skin integrity: Callus and dry skin present. No ulcer, blister, skin breakdown, erythema, warmth or fissure.     Toenail Condition: Right toenails are abnormally thick.     Left foot:     Skin integrity: Callus and dry skin present. No ulcer, blister, skin breakdown or erythema.     Toenail Condition: Left toenails are abnormally thick.     Comments: Gross scaling bilateral feet in moccasin pattern; no maceration between toes/erythema; mild odor and callous  noted heels and overlying MTP joints bilaterally; soft corn between left 4th/5th toes 3mm diameter dry Lymphadenopathy:     Head:     Right side of head: No submandibular or preauricular adenopathy.     Left side of head: No submandibular or preauricular adenopathy.     Cervical:     Right cervical: No superficial cervical adenopathy.    Left cervical: No superficial cervical adenopathy.  Skin:    General: Skin is warm and dry.     Capillary Refill: Capillary refill takes less than 2 seconds.     Coloration: Skin is not ashen, cyanotic, jaundiced, mottled, pale or sallow.     Findings: Rash present. No abrasion, abscess, acne, bruising, burn, ecchymosis, erythema, signs of injury, laceration, lesion, petechiae or wound. Rash is scaling. Rash is not crusting, macular, nodular, papular, purpuric, pustular, urticarial or vesicular.     Comments: Bilateral feet; all skin mildly dry with fine scale noted arms/legs/hands  Neurological:     General: No focal deficit present.     Mental Status: He is alert and oriented to person, place, and time. Mental status is at baseline.     GCS: GCS eye subscore is 4. GCS verbal subscore is 5. GCS motor subscore is 6.     Cranial Nerves: No cranial nerve deficit, dysarthria or facial asymmetry.     Motor: Motor function is intact. No weakness, tremor, atrophy, abnormal muscle tone or seizure activity.     Coordination: Coordination is intact. Coordination normal.     Gait: Gait is intact. Gait normal.     Comments: In/out of chair and on/off exam table without difficulty; gait sure and steady in clinic; bilateral hand grasp equal 5/5  Psychiatric:        Attention and Perception: Attention and perception normal.        Mood and Affect: Mood and affect normal.        Speech: Speech normal.        Behavior: Behavior normal. Behavior is cooperative.        Thought Content: Thought content normal.        Cognition and Memory: Cognition and memory normal.         Judgment: Judgment normal.      No results found for any visits on 12/13/24.    The 10-year ASCVD risk score (Arnett DK, et al., 2019) is: 9%    Assessment & Plan:   Problem List Items Addressed This Visit   None Visit Diagnoses  Soft corn    -  Primary     Verbal consent obtained; cleansed area between left 4th and 5th toes with povidone iodine.  #10 blade used to shave soft corn.  Patient reported decreased pain after procedure EBL none.  Discussed emollient on dry skin daily at bedtime e.g. aquaphor/vasline ointment.  Patient reported has decreased frequency of showering to help with dry skin also.  Stop toe separator use.  Exitcare handout on corns and callouses and diabetes skin care Patient to follow up for reshave if new growth/pain.  Avoid too tight fitting shoes.  Patient verbalized understanding information/instructions, agreed with plan of care and had no further questions at this time.  Return if symptoms worsen or fail to improve.    Ellouise DELENA Hope, NP  "

## 2024-12-13 NOTE — Patient Instructions (Addendum)
 Corns and Calluses: What to Know     Corns and calluses are skin conditions that can cause discomfort. Corns are small areas of thickened skin that usually form on a toe. They have a cone-shaped core that can press on nerves, causing pain. Calluses are larger areas of thickened skin that can form anywhere on the body. They often show up on the hands, soles of the feet, and heels. Calluses don't have a pointy core like corns do. What are the causes? Corns and calluses are caused by rubbing or pressure. Tight or bad-fitting shoes are often what cause these things. What increases the risk? Corns Corns are more likely to develop in people who have misshapen toes, such as hammertoes. Calluses Calluses are more likely to develop in people who: Work with their hands. Wear shoes that fit poorly, are too tight, or have high heels. Have misshapen toes. Some conditions make you more likely to get calluses and can lead to problems like ulceration or infection. These include: Diabetes. Poor blood flow. Numbness in your feet. What are the signs or symptoms? Symptoms of a corn or callus include: A hard growth on the skin. Pain or tenderness under the skin. Redness and swelling. More discomfort when wearing tight shoes, if your feet are affected. If a corn or callus becomes infected, symptoms may include: Redness and swelling that gets worse. Pain. Fluid, blood, or pus coming out of the corn or callus. How is this diagnosed? Corns and calluses may be diagnosed based on your: Symptoms. Medical history. Physical exam. How is this treated? Treatment for corns and calluses may include: Fixing the cause, like changing shoes, wearing gloves, or using shoe inserts or protective pads. Applying lotion to soften the skin. This may include using a medicated moisturizer. Gently removing dead layers of skin. Removing the corn or callus with a scalpel or laser. This is done by a health care provider who  specializes in skin or feet (dermatologist or podiatrist). Taking antibiotics, if it's infected. Having surgery for a misshapen toe. Follow these instructions at home:  Take your medicine only as told. If you were given antibiotics, take them as told. Do not stop taking them even if you start to feel better. Wear shoes that fit well. Avoid wearing shoes that have high heels or are too tight or too loose. Wear padding, protective layers, gloves, or orthotics as told. Soak your hands or feet and file the corn or callus with a file or pumice stone. Do this as told by your provider. Check your corn or callus every day for signs of infection. Contact a health care provider if: Your symptoms don't get better with treatment. You have redness or swelling that gets worse. Your corn or callus is painful. You have fluid, blood, or pus coming from your corn or callus. You have new symptoms. Get help right away if: You have very bad pain with redness. This information is not intended to replace advice given to you by your health care provider. Make sure you discuss any questions you have with your health care provider. Document Revised: 04/21/2023 Document Reviewed: 04/21/2023 Elsevier Patient Education  2024 [employer]vier [employer].  Diabetes and Skin Care: Self-Care Diabetes, also called diabetes mellitus, can lead to skin problems. If blood sugar (glucose) is not well controlled, it can cause problems over time. These problems include: Damage to nerves. This can affect your ability to feel wounds. This means you may not notice small skin injuries that could lead to  bigger problems. This can also decrease the amount that you sweat, causing dry skin. Damage to blood vessels. The lack of blood flow can cause skin to break down. It can also slow healing time, which can lead to infections. Areas of skin that become thick or discolored. Common skin conditions There are certain skin conditions that often affect  people with diabetes. These include: Dry skin. Thin skin. The skin on the feet may get thinner, break more easily, and heal more slowly than normal. Skin infections from bacteria. These include: Styes. These are infections near the eyelid. Boils. These are bumps filled with pus. Infected hair follicles. Infections of the skin around the nails. Fungal skin infections. These are most common in areas where skin rubs together, such as in the armpits or under the breasts. Common skin changes Diabetes can also cause the skin to change. You may develop: Dark, velvety markings on your skin. These may appear on your face, neck, armpits, inner thighs, and groin. Red, raised, scar-like tissue that may itch, feel painful, or become a wound. Blisters on your feet, toes, hands, or fingers. Thick, wax-like areas of skin. In most cases, these occur on the hands, forehead, or toes. Brown or red, ring-shaped or half-ring-shaped patches of skin on the ears or fingers. Pea-shaped, yellow bumps that may be itchy and have a red ring around them. This may affect your arms, feet, buttocks, and the top of your hands. Round, discolored patches of tan skin that do not hurt or itch. These may look like age spots. Supplies needed: Mild soap or gentle skin cleanser. Lotion. How to care for dry, itchy skin Frequent high glucose levels can cause skin to become itchy. Poor blood circulation and skin infections can make dry skin worse. If you have dry, itchy skin: Avoid very hot showers and baths. Use mild soap and gentle skin cleansers. Do not use soap that is perfumed, harsh, or that dries your skin. Moisturizing soaps may help. Put on moisturizing lotion as soon as you finish bathing. Do not scratch dry skin. Scratching can expose skin to infection. If you have a rash or if your skin is very itchy, contact your health care provider. Skin that is red or covered in a rash may be a sign of an allergic reaction. Very itchy  skin may mean that you need help to manage your diabetes better. You may also need treatment for an infection. General tips Most skin problems can be prevented or treated easily if caught early. Talk with your health care provider if you have any concerns. General tips include: Check your skin every day for cuts, bruises, redness, blisters, or sores, especially on your feet. If you cannot see the bottom of your feet, use a mirror or ask someone for help. Tell your health care provider if you have any of these injuries and if they are healing slowly. Keep your skin clean and dry. Do not use hot water. Moisturize your skin to prevent chapping. Keep your blood glucose levels within target range. Follow these instructions at home:  Take over-the-counter and prescription medicines only as told by your health care provider. This includes all diabetes medicines you are taking. Schedule a foot exam with your health care provider once a year. During the exam, the structure and skin of your feet will be checked for problems. Make sure that your health care provider does a visual foot exam at every visit. If you get a skin injury, such as a cut,  blister, or sore, check the area every day for signs of infection. Check for: Redness, swelling, or pain. Fluid or blood. Warmth. Pus or a bad smell. Do not use any products that contain nicotine or tobacco. These products include cigarettes, chewing tobacco, and vaping devices, such as e-cigarettes. If you need help quitting, ask your health care provider. Where to find more information American Diabetes Association: diabetes.org Association of Diabetes Care & Education Specialists: diabeteseducator.org Contact a health care provider if: You get a cut or sore, especially on your feet. You have signs of infection after a skin injury. You have itchy skin that turns red or develops a rash. You have discolored areas of skin. You have places on your skin that  change. They may thicken or appear shiny. This information is not intended to replace advice given to you by your health care provider. Make sure you discuss any questions you have with your health care provider. Document Revised: 08/31/2024 Document Reviewed: 04/28/2022 Elsevier Patient Education  2025 [employer].

## 2025-02-22 ENCOUNTER — Ambulatory Visit: Admitting: Family Medicine

## 2025-07-16 ENCOUNTER — Ambulatory Visit: Admitting: Infectious Diseases
